# Patient Record
Sex: Male | Born: 1935 | Race: White | Hispanic: No | Smoking: Never smoker
Health system: Southern US, Community
[De-identification: ages and names within clinical notes are randomized; demographics above are authoritative.]

## PROBLEM LIST (undated history)

## (undated) DIAGNOSIS — M1990 Unspecified osteoarthritis, unspecified site: Secondary | ICD-10-CM

## (undated) DIAGNOSIS — R0989 Other specified symptoms and signs involving the circulatory and respiratory systems: Secondary | ICD-10-CM

## (undated) DIAGNOSIS — E785 Hyperlipidemia, unspecified: Secondary | ICD-10-CM

## (undated) DIAGNOSIS — I2510 Atherosclerotic heart disease of native coronary artery without angina pectoris: Secondary | ICD-10-CM

## (undated) DIAGNOSIS — D509 Iron deficiency anemia, unspecified: Secondary | ICD-10-CM

## (undated) DIAGNOSIS — IMO0002 Reserved for concepts with insufficient information to code with codable children: Secondary | ICD-10-CM

## (undated) DIAGNOSIS — G4733 Obstructive sleep apnea (adult) (pediatric): Secondary | ICD-10-CM

## (undated) DIAGNOSIS — M25561 Pain in right knee: Secondary | ICD-10-CM

## (undated) DIAGNOSIS — I4891 Unspecified atrial fibrillation: Secondary | ICD-10-CM

## (undated) DIAGNOSIS — K219 Gastro-esophageal reflux disease without esophagitis: Secondary | ICD-10-CM

## (undated) DIAGNOSIS — C801 Malignant (primary) neoplasm, unspecified: Secondary | ICD-10-CM

## (undated) DIAGNOSIS — I10 Essential (primary) hypertension: Secondary | ICD-10-CM

## (undated) DIAGNOSIS — Z992 Dependence on renal dialysis: Secondary | ICD-10-CM

## (undated) DIAGNOSIS — I739 Peripheral vascular disease, unspecified: Secondary | ICD-10-CM

## (undated) DIAGNOSIS — N186 End stage renal disease: Secondary | ICD-10-CM

## (undated) DIAGNOSIS — I350 Nonrheumatic aortic (valve) stenosis: Secondary | ICD-10-CM

## (undated) HISTORY — DX: Atherosclerotic heart disease of native coronary artery without angina pectoris: I25.10

## (undated) HISTORY — DX: Essential (primary) hypertension: I10

## (undated) HISTORY — PX: SHOULDER SURGERY: SHX246

## (undated) HISTORY — DX: Obstructive sleep apnea (adult) (pediatric): G47.33

## (undated) HISTORY — PX: HERNIA REPAIR: SHX51

## (undated) HISTORY — DX: Pain in right knee: M25.561

## (undated) HISTORY — DX: Unspecified osteoarthritis, unspecified site: M19.90

## (undated) HISTORY — DX: Iron deficiency anemia, unspecified: D50.9

## (undated) HISTORY — PX: BACK SURGERY: SHX140

## (undated) HISTORY — DX: End stage renal disease: Z99.2

## (undated) HISTORY — DX: Hyperlipidemia, unspecified: E78.5

## (undated) HISTORY — PX: KNEE SURGERY: SHX244

## (undated) HISTORY — PX: CHOLECYSTECTOMY: SHX55

## (undated) HISTORY — DX: Unspecified atrial fibrillation: I48.91

## (undated) HISTORY — PX: NECK SURGERY: SHX720

## (undated) HISTORY — DX: Malignant (primary) neoplasm, unspecified: C80.1

## (undated) HISTORY — PX: AV FISTULA PLACEMENT: SHX1204

## (undated) HISTORY — DX: Reserved for concepts with insufficient information to code with codable children: IMO0002

## (undated) HISTORY — DX: Other specified symptoms and signs involving the circulatory and respiratory systems: R09.89

## (undated) HISTORY — PX: LEG SURGERY: SHX1003

## (undated) HISTORY — DX: Peripheral vascular disease, unspecified: I73.9

## (undated) HISTORY — DX: Gastro-esophageal reflux disease without esophagitis: K21.9

## (undated) HISTORY — DX: End stage renal disease: N18.6

## (undated) HISTORY — DX: Nonrheumatic aortic (valve) stenosis: I35.0

## (undated) HISTORY — PX: CATARACT EXTRACTION, BILATERAL: SHX1313

---

## 1998-12-04 ENCOUNTER — Ambulatory Visit: Admission: RE | Admit: 1998-12-04 | Discharge: 1998-12-04 | Payer: Self-pay | Admitting: Internal Medicine

## 1999-12-21 ENCOUNTER — Emergency Department (HOSPITAL_COMMUNITY): Admission: EM | Admit: 1999-12-21 | Discharge: 1999-12-21 | Payer: Self-pay | Admitting: Emergency Medicine

## 2000-02-21 ENCOUNTER — Ambulatory Visit (HOSPITAL_COMMUNITY): Admission: RE | Admit: 2000-02-21 | Discharge: 2000-02-21 | Payer: Self-pay | Admitting: Neurological Surgery

## 2000-02-21 ENCOUNTER — Encounter: Payer: Self-pay | Admitting: Neurological Surgery

## 2000-03-09 ENCOUNTER — Encounter: Admission: RE | Admit: 2000-03-09 | Discharge: 2000-04-15 | Payer: Self-pay | Admitting: Neurological Surgery

## 2000-05-23 ENCOUNTER — Encounter: Payer: Self-pay | Admitting: Emergency Medicine

## 2000-05-23 ENCOUNTER — Emergency Department (HOSPITAL_COMMUNITY): Admission: EM | Admit: 2000-05-23 | Discharge: 2000-05-23 | Payer: Self-pay | Admitting: Emergency Medicine

## 2000-11-29 ENCOUNTER — Encounter: Payer: Self-pay | Admitting: Neurological Surgery

## 2000-11-29 ENCOUNTER — Encounter: Admission: RE | Admit: 2000-11-29 | Discharge: 2000-11-29 | Payer: Self-pay | Admitting: Neurological Surgery

## 2001-07-12 ENCOUNTER — Encounter: Payer: Self-pay | Admitting: Internal Medicine

## 2001-07-12 ENCOUNTER — Encounter: Admission: RE | Admit: 2001-07-12 | Discharge: 2001-07-12 | Payer: Self-pay | Admitting: Internal Medicine

## 2002-01-07 ENCOUNTER — Emergency Department (HOSPITAL_COMMUNITY): Admission: EM | Admit: 2002-01-07 | Discharge: 2002-01-07 | Payer: Self-pay | Admitting: *Deleted

## 2002-06-26 ENCOUNTER — Encounter: Admission: RE | Admit: 2002-06-26 | Discharge: 2002-06-26 | Payer: Self-pay | Admitting: Endocrinology

## 2002-06-26 ENCOUNTER — Encounter: Payer: Self-pay | Admitting: Endocrinology

## 2002-07-28 ENCOUNTER — Encounter: Payer: Self-pay | Admitting: Orthopedic Surgery

## 2002-07-28 ENCOUNTER — Encounter: Admission: RE | Admit: 2002-07-28 | Discharge: 2002-07-28 | Payer: Self-pay | Admitting: Orthopedic Surgery

## 2002-10-03 ENCOUNTER — Encounter: Payer: Self-pay | Admitting: Orthopedic Surgery

## 2002-10-10 ENCOUNTER — Encounter: Payer: Self-pay | Admitting: Orthopedic Surgery

## 2002-10-10 ENCOUNTER — Inpatient Hospital Stay (HOSPITAL_COMMUNITY): Admission: RE | Admit: 2002-10-10 | Discharge: 2002-10-13 | Payer: Self-pay | Admitting: Orthopedic Surgery

## 2003-04-11 ENCOUNTER — Encounter: Admission: RE | Admit: 2003-04-11 | Discharge: 2003-04-11 | Payer: Self-pay | Admitting: Internal Medicine

## 2003-06-07 ENCOUNTER — Ambulatory Visit (HOSPITAL_COMMUNITY): Admission: RE | Admit: 2003-06-07 | Discharge: 2003-06-07 | Payer: Self-pay | Admitting: Neurological Surgery

## 2003-08-01 ENCOUNTER — Encounter (HOSPITAL_COMMUNITY): Admission: RE | Admit: 2003-08-01 | Discharge: 2003-10-30 | Payer: Self-pay | Admitting: Internal Medicine

## 2003-11-07 ENCOUNTER — Encounter (HOSPITAL_COMMUNITY): Admission: RE | Admit: 2003-11-07 | Discharge: 2003-12-21 | Payer: Self-pay | Admitting: Internal Medicine

## 2003-12-22 ENCOUNTER — Encounter: Admission: RE | Admit: 2003-12-22 | Discharge: 2004-03-21 | Payer: Self-pay | Admitting: Internal Medicine

## 2004-03-25 ENCOUNTER — Encounter: Admission: RE | Admit: 2004-03-25 | Discharge: 2004-06-23 | Payer: Self-pay | Admitting: Internal Medicine

## 2004-04-09 ENCOUNTER — Encounter: Admission: RE | Admit: 2004-04-09 | Discharge: 2004-04-09 | Payer: Self-pay | Admitting: Orthopedic Surgery

## 2004-07-02 ENCOUNTER — Encounter (HOSPITAL_COMMUNITY): Admission: RE | Admit: 2004-07-02 | Discharge: 2004-09-30 | Payer: Self-pay | Admitting: Internal Medicine

## 2004-10-22 ENCOUNTER — Encounter (HOSPITAL_COMMUNITY): Admission: RE | Admit: 2004-10-22 | Discharge: 2004-12-17 | Payer: Self-pay | Admitting: Internal Medicine

## 2004-11-28 ENCOUNTER — Encounter: Admission: RE | Admit: 2004-11-28 | Discharge: 2004-11-28 | Payer: Self-pay | Admitting: Internal Medicine

## 2004-12-24 ENCOUNTER — Encounter (HOSPITAL_COMMUNITY): Admission: RE | Admit: 2004-12-24 | Discharge: 2005-03-20 | Payer: Self-pay | Admitting: Internal Medicine

## 2005-02-05 ENCOUNTER — Inpatient Hospital Stay (HOSPITAL_COMMUNITY): Admission: RE | Admit: 2005-02-05 | Discharge: 2005-02-07 | Payer: Self-pay | Admitting: Cardiology

## 2005-02-06 HISTORY — PX: CARDIAC CATHETERIZATION: SHX172

## 2005-04-17 ENCOUNTER — Encounter (HOSPITAL_COMMUNITY): Admission: RE | Admit: 2005-04-17 | Discharge: 2005-07-16 | Payer: Self-pay | Admitting: Internal Medicine

## 2005-07-14 ENCOUNTER — Encounter: Admission: RE | Admit: 2005-07-14 | Discharge: 2005-07-14 | Payer: Self-pay | Admitting: Internal Medicine

## 2006-05-13 ENCOUNTER — Ambulatory Visit (HOSPITAL_COMMUNITY): Admission: RE | Admit: 2006-05-13 | Discharge: 2006-05-13 | Payer: Self-pay | Admitting: Neurological Surgery

## 2006-06-01 HISTORY — PX: CARDIOVASCULAR STRESS TEST: SHX262

## 2006-07-26 ENCOUNTER — Ambulatory Visit: Payer: Self-pay | Admitting: Internal Medicine

## 2006-07-26 ENCOUNTER — Inpatient Hospital Stay (HOSPITAL_COMMUNITY): Admission: RE | Admit: 2006-07-26 | Discharge: 2006-08-05 | Payer: Self-pay | Admitting: Neurological Surgery

## 2006-08-05 ENCOUNTER — Inpatient Hospital Stay (HOSPITAL_COMMUNITY)
Admission: RE | Admit: 2006-08-05 | Discharge: 2006-08-18 | Payer: Self-pay | Admitting: Physical Medicine & Rehabilitation

## 2006-08-05 ENCOUNTER — Ambulatory Visit: Payer: Self-pay | Admitting: Physical Medicine & Rehabilitation

## 2007-10-24 ENCOUNTER — Ambulatory Visit: Payer: Self-pay | Admitting: Surgery

## 2008-02-01 ENCOUNTER — Ambulatory Visit: Payer: Self-pay | Admitting: Surgery

## 2008-02-01 ENCOUNTER — Ambulatory Visit (HOSPITAL_COMMUNITY): Admission: RE | Admit: 2008-02-01 | Discharge: 2008-02-01 | Payer: Self-pay | Admitting: Surgery

## 2008-02-06 ENCOUNTER — Inpatient Hospital Stay (HOSPITAL_COMMUNITY): Admission: AD | Admit: 2008-02-06 | Discharge: 2008-02-09 | Payer: Self-pay | Admitting: Cardiology

## 2008-02-07 ENCOUNTER — Encounter: Payer: Self-pay | Admitting: Cardiology

## 2008-02-07 HISTORY — PX: TRANSTHORACIC ECHOCARDIOGRAM: SHX275

## 2008-02-13 ENCOUNTER — Ambulatory Visit: Payer: Self-pay | Admitting: Surgery

## 2008-02-14 ENCOUNTER — Ambulatory Visit (HOSPITAL_COMMUNITY): Admission: RE | Admit: 2008-02-14 | Discharge: 2008-02-14 | Payer: Self-pay | Admitting: Vascular Surgery

## 2008-03-09 ENCOUNTER — Inpatient Hospital Stay (HOSPITAL_COMMUNITY): Admission: EM | Admit: 2008-03-09 | Discharge: 2008-03-13 | Payer: Self-pay | Admitting: Emergency Medicine

## 2008-05-31 ENCOUNTER — Ambulatory Visit: Payer: Self-pay | Admitting: Vascular Surgery

## 2008-05-31 ENCOUNTER — Ambulatory Visit (HOSPITAL_COMMUNITY): Admission: RE | Admit: 2008-05-31 | Discharge: 2008-05-31 | Payer: Self-pay | Admitting: Vascular Surgery

## 2008-11-27 ENCOUNTER — Encounter: Admission: RE | Admit: 2008-11-27 | Discharge: 2008-11-27 | Payer: Self-pay | Admitting: Nephrology

## 2008-12-04 ENCOUNTER — Encounter: Admission: RE | Admit: 2008-12-04 | Discharge: 2008-12-04 | Payer: Self-pay | Admitting: Nephrology

## 2008-12-04 ENCOUNTER — Encounter: Admission: RE | Admit: 2008-12-04 | Discharge: 2008-12-18 | Payer: Self-pay | Admitting: Internal Medicine

## 2009-02-19 ENCOUNTER — Ambulatory Visit: Payer: Self-pay | Admitting: Vascular Surgery

## 2009-02-25 ENCOUNTER — Ambulatory Visit: Payer: Self-pay | Admitting: Surgery

## 2009-03-14 ENCOUNTER — Ambulatory Visit (HOSPITAL_COMMUNITY): Admission: RE | Admit: 2009-03-14 | Discharge: 2009-03-14 | Payer: Self-pay | Admitting: Surgery

## 2009-03-14 ENCOUNTER — Ambulatory Visit: Payer: Self-pay | Admitting: Surgery

## 2009-04-01 ENCOUNTER — Inpatient Hospital Stay (HOSPITAL_COMMUNITY): Admission: EM | Admit: 2009-04-01 | Discharge: 2009-04-07 | Payer: Self-pay | Admitting: Emergency Medicine

## 2009-04-04 ENCOUNTER — Encounter (INDEPENDENT_AMBULATORY_CARE_PROVIDER_SITE_OTHER): Payer: Self-pay | Admitting: Internal Medicine

## 2009-04-04 HISTORY — PX: TRANSTHORACIC ECHOCARDIOGRAM: SHX275

## 2009-04-16 ENCOUNTER — Ambulatory Visit: Payer: Self-pay | Admitting: Vascular Surgery

## 2009-10-03 ENCOUNTER — Emergency Department (HOSPITAL_COMMUNITY): Admission: EM | Admit: 2009-10-03 | Discharge: 2009-10-04 | Payer: Self-pay | Admitting: Emergency Medicine

## 2009-11-11 ENCOUNTER — Emergency Department (HOSPITAL_COMMUNITY): Admission: EM | Admit: 2009-11-11 | Discharge: 2009-11-11 | Payer: Self-pay | Admitting: Emergency Medicine

## 2009-11-28 ENCOUNTER — Ambulatory Visit: Payer: Self-pay | Admitting: Cardiology

## 2010-01-21 ENCOUNTER — Emergency Department (HOSPITAL_COMMUNITY)
Admission: EM | Admit: 2010-01-21 | Discharge: 2010-01-21 | Payer: Self-pay | Source: Home / Self Care | Admitting: Emergency Medicine

## 2010-04-12 ENCOUNTER — Emergency Department (HOSPITAL_COMMUNITY)
Admission: EM | Admit: 2010-04-12 | Discharge: 2010-04-12 | Payer: Self-pay | Source: Home / Self Care | Admitting: Family Medicine

## 2010-05-23 ENCOUNTER — Ambulatory Visit: Payer: Self-pay | Admitting: Cardiology

## 2010-05-29 NOTE — Letter (Signed)
Summary: [facility] - Office Visit  [facility] - Office Visit   Imported By: Marylou Mccoy 05/19/2010 11:48:40  _____________________________________________________________________  External Attachment:    Type:   Image     Comment:   External Document

## 2010-05-29 NOTE — Op Note (Signed)
Summary: [facility]  [facility]   Imported By: Marylou Mccoy 05/19/2010 11:43:53  _____________________________________________________________________  External Attachment:    Type:   Image     Comment:   External Document

## 2010-06-03 LAB — CBC
HCT: 34.6 % — ABNORMAL LOW (ref 39.0–52.0)
Hemoglobin: 11.4 g/dL — ABNORMAL LOW (ref 13.0–17.0)
MCV: 95.1 fL (ref 78.0–100.0)
Platelets: 222 10*3/uL (ref 150–400)
RBC: 3.64 MIL/uL — ABNORMAL LOW (ref 4.22–5.81)
WBC: 11.2 10*3/uL — ABNORMAL HIGH (ref 4.0–10.5)

## 2010-06-03 LAB — POCT I-STAT, CHEM 8
BUN: 46 mg/dL — ABNORMAL HIGH (ref 6–23)
Calcium, Ion: 1.06 mmol/L — ABNORMAL LOW (ref 1.12–1.32)
Chloride: 101 meq/L (ref 96–112)
Creatinine, Ser: 4.6 mg/dL — ABNORMAL HIGH (ref 0.4–1.5)
Glucose, Bld: 116 mg/dL — ABNORMAL HIGH (ref 70–99)
HCT: 39 % (ref 39.0–52.0)
Hemoglobin: 13.3 g/dL (ref 13.0–17.0)
Potassium: 4.3 meq/L (ref 3.5–5.1)
Sodium: 137 mEq/L (ref 135–145)
TCO2: 30 mmol/L (ref 0–100)

## 2010-06-03 LAB — DIFFERENTIAL
Eosinophils Relative: 0 % (ref 0–5)
Lymphocytes Relative: 7 % — ABNORMAL LOW (ref 12–46)
Lymphs Abs: 0.8 10*3/uL (ref 0.7–4.0)
Monocytes Relative: 7 % (ref 3–12)

## 2010-06-05 ENCOUNTER — Ambulatory Visit (INDEPENDENT_AMBULATORY_CARE_PROVIDER_SITE_OTHER): Payer: Medicare Other | Admitting: Cardiology

## 2010-06-05 DIAGNOSIS — I2510 Atherosclerotic heart disease of native coronary artery without angina pectoris: Secondary | ICD-10-CM

## 2010-06-05 DIAGNOSIS — N189 Chronic kidney disease, unspecified: Secondary | ICD-10-CM

## 2010-06-05 DIAGNOSIS — I739 Peripheral vascular disease, unspecified: Secondary | ICD-10-CM

## 2010-06-05 DIAGNOSIS — I10 Essential (primary) hypertension: Secondary | ICD-10-CM

## 2010-06-05 LAB — COMPREHENSIVE METABOLIC PANEL
BUN: 33 mg/dL — ABNORMAL HIGH (ref 6–23)
CO2: 31 mEq/L (ref 19–32)
Chloride: 98 mEq/L (ref 96–112)
Creatinine, Ser: 5.65 mg/dL — ABNORMAL HIGH (ref 0.4–1.5)
GFR calc non Af Amer: 10 mL/min — ABNORMAL LOW (ref >60)
Total Bilirubin: 1.2 mg/dL (ref 0.3–1.2)

## 2010-06-05 LAB — CBC
Hemoglobin: 10.8 g/dL — ABNORMAL LOW (ref 13.0–17.0)
MCH: 31.7 pg (ref 26.0–34.0)
MCV: 98.5 fL (ref 78.0–100.0)
RBC: 3.41 MIL/uL — ABNORMAL LOW (ref 4.22–5.81)

## 2010-06-05 LAB — PROTIME-INR: Prothrombin Time: 15.2 seconds (ref 11.6–15.2)

## 2010-06-05 LAB — APTT: aPTT: 33 seconds (ref 24–37)

## 2010-06-05 LAB — DIFFERENTIAL
Basophils Absolute: 0 10*3/uL (ref 0.0–0.1)
Lymphocytes Relative: 13 % (ref 12–46)
Neutro Abs: 6.5 10*3/uL (ref 1.7–7.7)

## 2010-06-05 LAB — POCT CARDIAC MARKERS: Troponin i, poc: <0.05 ng/mL (ref 0.00–0.09)

## 2010-06-07 LAB — CBC
HCT: 33.3 % — ABNORMAL LOW (ref 39.0–52.0)
HCT: 34.2 % — ABNORMAL LOW (ref 39.0–52.0)
HCT: 34.3 % — ABNORMAL LOW (ref 39.0–52.0)
HCT: 35 % — ABNORMAL LOW (ref 39.0–52.0)
HCT: 36 % — ABNORMAL LOW (ref 39.0–52.0)
Hemoglobin: 11.3 g/dL — ABNORMAL LOW (ref 13.0–17.0)
Hemoglobin: 11.5 g/dL — ABNORMAL LOW (ref 13.0–17.0)
Hemoglobin: 11.6 g/dL — ABNORMAL LOW (ref 13.0–17.0)
Hemoglobin: 11.9 g/dL — ABNORMAL LOW (ref 13.0–17.0)
MCHC: 32.8 g/dL (ref 30.0–36.0)
MCHC: 33.1 g/dL (ref 30.0–36.0)
MCHC: 33.6 g/dL (ref 30.0–36.0)
MCV: 89.8 fL (ref 78.0–100.0)
MCV: 90.2 fL (ref 78.0–100.0)
Platelets: 135 10*3/uL — ABNORMAL LOW (ref 150–400)
Platelets: 178 10*3/uL (ref 150–400)
Platelets: 190 10*3/uL (ref 150–400)
Platelets: 192 10*3/uL (ref 150–400)
Platelets: 200 10*3/uL (ref 150–400)
Platelets: 230 10*3/uL (ref 150–400)
RBC: 3.79 MIL/uL — ABNORMAL LOW (ref 4.22–5.81)
RBC: 3.82 MIL/uL — ABNORMAL LOW (ref 4.22–5.81)
RDW: 17.5 % — ABNORMAL HIGH (ref 11.5–15.5)
RDW: 17.5 % — ABNORMAL HIGH (ref 11.5–15.5)
RDW: 17.5 % — ABNORMAL HIGH (ref 11.5–15.5)
RDW: 17.6 % — ABNORMAL HIGH (ref 11.5–15.5)
RDW: 17.9 % — ABNORMAL HIGH (ref 11.5–15.5)
WBC: 13.1 10*3/uL — ABNORMAL HIGH (ref 4.0–10.5)
WBC: 14.5 10*3/uL — ABNORMAL HIGH (ref 4.0–10.5)
WBC: 15 10*3/uL — ABNORMAL HIGH (ref 4.0–10.5)
WBC: 15.5 10*3/uL — ABNORMAL HIGH (ref 4.0–10.5)

## 2010-06-07 LAB — POCT CARDIAC MARKERS
CKMB, poc: 8.5 ng/mL (ref 1.0–8.0)
Troponin i, poc: <0.05 ng/mL (ref 0.00–0.09)

## 2010-06-07 LAB — RENAL FUNCTION PANEL
Albumin: 3.3 g/dL — ABNORMAL LOW (ref 3.5–5.2)
Albumin: 3.8 g/dL (ref 3.5–5.2)
BUN: 13 mg/dL (ref 6–23)
BUN: 20 mg/dL (ref 6–23)
CO2: 29 mEq/L (ref 19–32)
CO2: 31 mEq/L (ref 19–32)
Calcium: 8.8 mg/dL (ref 8.4–10.5)
Calcium: 9.1 mg/dL (ref 8.4–10.5)
Chloride: 96 mEq/L (ref 96–112)
Chloride: 98 mEq/L (ref 96–112)
Creatinine, Ser: 2.94 mg/dL — ABNORMAL HIGH (ref 0.4–1.5)
Creatinine, Ser: 3.72 mg/dL — ABNORMAL HIGH (ref 0.4–1.5)
GFR calc Af Amer: 19 mL/min — ABNORMAL LOW (ref >60)
GFR calc Af Amer: 25 mL/min — ABNORMAL LOW (ref >60)
GFR calc non Af Amer: 15 mL/min — ABNORMAL LOW (ref >60)
GFR calc non Af Amer: 20 mL/min — ABNORMAL LOW (ref >60)
Glucose, Bld: 80 mg/dL (ref 70–99)
Glucose, Bld: 92 mg/dL (ref 70–99)
Phosphorus: 3.5 mg/dL (ref 2.3–4.6)
Phosphorus: 4.5 mg/dL (ref 2.3–4.6)
Potassium: 4.3 mEq/L (ref 3.5–5.1)
Potassium: 4.5 mEq/L (ref 3.5–5.1)
Sodium: 137 mEq/L (ref 135–145)
Sodium: 138 mEq/L (ref 135–145)

## 2010-06-07 LAB — BLOOD GAS, ARTERIAL
Acid-Base Excess: 3.3 mmol/L — ABNORMAL HIGH (ref 0.0–2.0)
Drawn by: 249101
O2 Content: 2 L/min
pCO2 arterial: 39 mmHg (ref 35.0–45.0)
pH, Arterial: 7.455 — ABNORMAL HIGH (ref 7.350–7.450)
pO2, Arterial: 76 mmHg — ABNORMAL LOW (ref 80.0–100.0)

## 2010-06-07 LAB — HEPATITIS B SURFACE ANTIGEN
Hepatitis B Surface Ag: NEGATIVE
Hepatitis B Surface Ag: NEGATIVE

## 2010-06-07 LAB — DIFFERENTIAL
Basophils Absolute: 0 10*3/uL (ref 0.0–0.1)
Basophils Relative: 0 % (ref 0–1)
Eosinophils Absolute: 0 10*3/uL (ref 0.0–0.7)
Eosinophils Relative: 0 % (ref 0–5)
Monocytes Absolute: 0.7 10*3/uL (ref 0.1–1.0)
Neutro Abs: 13.8 10*3/uL — ABNORMAL HIGH (ref 1.7–7.7)

## 2010-06-07 LAB — PHOSPHORUS
Phosphorus: 5.3 mg/dL — ABNORMAL HIGH (ref 2.3–4.6)
Phosphorus: 6.5 mg/dL — ABNORMAL HIGH (ref 2.3–4.6)

## 2010-06-07 LAB — GLUCOSE, CAPILLARY
Glucose-Capillary: 108 mg/dL — ABNORMAL HIGH (ref 70–99)
Glucose-Capillary: 121 mg/dL — ABNORMAL HIGH (ref 70–99)

## 2010-06-07 LAB — BASIC METABOLIC PANEL
BUN: 30 mg/dL — ABNORMAL HIGH (ref 6–23)
BUN: 35 mg/dL — ABNORMAL HIGH (ref 6–23)
BUN: 63 mg/dL — ABNORMAL HIGH (ref 6–23)
CO2: 27 mEq/L (ref 19–32)
CO2: 31 mEq/L (ref 19–32)
Calcium: 8.5 mg/dL (ref 8.4–10.5)
Calcium: 8.6 mg/dL (ref 8.4–10.5)
Calcium: 8.7 mg/dL (ref 8.4–10.5)
Chloride: 94 mEq/L — ABNORMAL LOW (ref 96–112)
Creatinine, Ser: 4.2 mg/dL — ABNORMAL HIGH (ref 0.4–1.5)
Creatinine, Ser: 5.74 mg/dL — ABNORMAL HIGH (ref 0.4–1.5)
Creatinine, Ser: 6.43 mg/dL — ABNORMAL HIGH (ref 0.4–1.5)
GFR calc Af Amer: 11 mL/min — ABNORMAL LOW (ref >60)
GFR calc non Af Amer: 8 mL/min — ABNORMAL LOW (ref >60)
GFR calc non Af Amer: 9 mL/min — ABNORMAL LOW (ref >60)
Glucose, Bld: 102 mg/dL — ABNORMAL HIGH (ref 70–99)
Glucose, Bld: 111 mg/dL — ABNORMAL HIGH (ref 70–99)
Potassium: 4 mEq/L (ref 3.5–5.1)
Sodium: 136 mEq/L (ref 135–145)
Sodium: 138 mEq/L (ref 135–145)

## 2010-06-07 LAB — CARDIAC PANEL(CRET KIN+CKTOT+MB+TROPI)
CK, MB: 15.3 ng/mL (ref 0.3–4.0)
CK, MB: 9.3 ng/mL (ref 0.3–4.0)
Relative Index: 1.6 (ref 0.0–2.5)
Relative Index: 2.1 (ref 0.0–2.5)
Total CK: 564 U/L — ABNORMAL HIGH (ref 7–232)
Total CK: 723 U/L — ABNORMAL HIGH (ref 7–232)
Troponin I: 0.06 ng/mL (ref 0.00–0.06)
Troponin I: 0.08 ng/mL — ABNORMAL HIGH (ref 0.00–0.06)

## 2010-06-07 LAB — COMPREHENSIVE METABOLIC PANEL
ALT: 24 U/L (ref 0–53)
Albumin: 3.6 g/dL (ref 3.5–5.2)
Alkaline Phosphatase: 91 U/L (ref 39–117)
BUN: 43 mg/dL — ABNORMAL HIGH (ref 6–23)
Chloride: 97 mEq/L (ref 96–112)
Potassium: 6 mEq/L — ABNORMAL HIGH (ref 3.5–5.1)
Total Bilirubin: 0.8 mg/dL (ref 0.3–1.2)

## 2010-06-07 LAB — CK TOTAL AND CKMB (NOT AT ARMC)
CK, MB: 18.6 ng/mL (ref 0.3–4.0)
Relative Index: 3.4 — ABNORMAL HIGH (ref 0.0–2.5)
Relative Index: 3.4 — ABNORMAL HIGH (ref 0.0–2.5)
Total CK: 551 U/L — ABNORMAL HIGH (ref 7–232)

## 2010-06-07 LAB — IRON AND TIBC
Iron: 198 ug/dL — ABNORMAL HIGH (ref 42–135)
Saturation Ratios: 61 % — ABNORMAL HIGH (ref 20–55)
UIBC: 124 ug/dL

## 2010-06-07 LAB — URINE CULTURE
Colony Count: NO GROWTH
Special Requests: NEGATIVE

## 2010-06-07 LAB — CULTURE, BLOOD (ROUTINE X 2)

## 2010-06-07 LAB — ALBUMIN
Albumin: 2.8 g/dL — ABNORMAL LOW (ref 3.5–5.2)
Albumin: 2.9 g/dL — ABNORMAL LOW (ref 3.5–5.2)

## 2010-06-07 LAB — ALT: ALT: 27 U/L (ref 0–53)

## 2010-06-07 LAB — PTH, INTACT AND CALCIUM
Calcium, Total (PTH): 9.1 mg/dL (ref 8.4–10.5)
PTH: 148 pg/mL — ABNORMAL HIGH (ref 14.0–72.0)

## 2010-06-07 LAB — HEPATITIS B CORE ANTIBODY, TOTAL: Hep B Core Total Ab: NEGATIVE

## 2010-06-08 LAB — CBC
HCT: 35.3 % — ABNORMAL LOW (ref 39.0–52.0)
Hemoglobin: 11.1 g/dL — ABNORMAL LOW (ref 13.0–17.0)
MCHC: 33.3 g/dL (ref 30.0–36.0)
MCHC: 33.8 g/dL (ref 30.0–36.0)
MCV: 89 fL (ref 78.0–100.0)
RBC: 3.68 MIL/uL — ABNORMAL LOW (ref 4.22–5.81)
RDW: 16.8 % — ABNORMAL HIGH (ref 11.5–15.5)

## 2010-06-08 LAB — BASIC METABOLIC PANEL
CO2: 28 mEq/L (ref 19–32)
Chloride: 93 mEq/L — ABNORMAL LOW (ref 96–112)
GFR calc Af Amer: 10 mL/min — ABNORMAL LOW (ref >60)
Potassium: 4 mEq/L (ref 3.5–5.1)
Sodium: 131 mEq/L — ABNORMAL LOW (ref 135–145)

## 2010-06-08 LAB — DIFFERENTIAL
Basophils Absolute: 0 10*3/uL (ref 0.0–0.1)
Basophils Relative: 0 % (ref 0–1)
Eosinophils Relative: 2 % (ref 0–5)
Monocytes Absolute: 0.7 10*3/uL (ref 0.1–1.0)
Neutro Abs: 8.2 10*3/uL — ABNORMAL HIGH (ref 1.7–7.7)

## 2010-06-08 LAB — POCT I-STAT, CHEM 8
Calcium, Ion: 1.12 mmol/L (ref 1.12–1.32)
Chloride: 99 mEq/L (ref 96–112)
HCT: 37 % — ABNORMAL LOW (ref 39.0–52.0)
TCO2: 32 mmol/L (ref 0–100)

## 2010-06-08 LAB — POCT CARDIAC MARKERS

## 2010-06-23 LAB — POCT I-STAT, CHEM 8
Creatinine, Ser: 3.2 mg/dL — ABNORMAL HIGH (ref 0.4–1.5)
Glucose, Bld: 112 mg/dL — ABNORMAL HIGH (ref 70–99)
Hemoglobin: 16.3 g/dL (ref 13.0–17.0)
TCO2: 33 mmol/L (ref 0–100)

## 2010-08-05 NOTE — Procedures (Signed)
CEPHALIC VEIN MAPPING   INDICATION:  Preop exam for AV fistula placement.   HISTORY:  End-stage renal disease.   EXAM:  The right cephalic vein was not evaluated.   The left cephalic vein is compressible.   Diameter measurements range from 0.2 to 0.59 cm.   See attached worksheet for all measurements.   IMPRESSION:  Patent left cephalic vein which is of acceptable diameter  for use as a dialysis access site.   ___________________________________________  V. Charlena Cross, MD   CH/MEDQ  D:  10/24/2007  T:  10/25/2007  Job:  [id number]

## 2010-08-05 NOTE — Assessment & Plan Note (Signed)
OFFICE VISIT   KENSINGTON, DUERST  DOB:  November 20, 1918                                       04/16/2009  QQVZD#:[id number]   An angiography was performed by Dr. Myra Gianotti on December 23 for bilateral  leg pain.  He was found to have severe nonreconstructable bilateral  tibial disease.  I have reviewed those angiograms today and concur with  that.  He apparently had a fall at home a few weeks ago, possibly  related to taking too much amitriptyline.  He was admitted to the  hospital for 6 days, discharged on January 16 with altered mental status  and pneumonia and sepsis syndrome.  He has developed some pain, erythema  and tenderness in the right first and second toe where he apparently cut  himself between the 2 toes at the time of the fall.  He is currently on  Cipro at home and has been seen by Dr.  Wynelle Cleveland who referred him to [place]  initially.   PHYSICAL EXAMINATION:  Today blood pressure 125/50, heart rate 78,  respirations 14, temperature 98.  He is an elderly male who is alert and  oriented in no apparent distress.  HEENT:  Exam is unremarkable.  Chest:  Clear to auscultation.  Lower extremity exam reveals 3+ femoral  and 3+  popliteal pulses bilaterally with no distal pulses.  The right first and  second toe were erythematous, swollen and tender with no purulent  drainage.  The left foot is free of any active ulceration.  There is a  dry eschar on the lateral aspect of the ankle.   I discussed this with the patient and his son and the fact that he is  not a candidate for revascularization.  Will await for Dr.  Wynelle Cleveland to  see the patient in the next 2 days and decide whether to refer him to  Dr. Aldean Baker,  orthopedic surgeon or whether Dr. Wynelle Cleveland will  manage this further.  He will return to see [place] on a p.r.n. basis.     Quita Skye Hart Rochester, M.D.  Electronically Signed   JDL/MEDQ  D:  04/16/2009  T:  04/17/2009  Job:  [id number]
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
## 2010-08-05 NOTE — Discharge Summary (Signed)
NAME:  Gerald Tucker, Gerald Tucker                 ACCOUNT NO.:  [id number]   MEDICAL RECORD NO.:  [id number]          PATIENT TYPE:  IPS   LOCATION:  4010                         FACILITY:  [facility]   PHYSICIAN:  Ranelle Oyster, M.D.DATE OF BIRTH:  June 10, 1918   DATE OF ADMISSION:  08/05/2006  DATE OF DISCHARGE:  08/18/2006                               DISCHARGE SUMMARY   DISCHARGE DIAGNOSES:  1. Lumbar stenosis with radiculopathy, status post decompression of      lumbar L2-S1, L4-5, with PEEK spacer of lumbar L3-4 and 4-5,      completed Jul 26, 2006.  2. Pain control.  3. Subcutaneous Lovenox for deep vein thrombosis prophylaxis.  4. Postoperative anemia.  5. Postoperative delirium, resolved.  6. Hypertension.  7. Chronic renal insufficiency.  8. Obstructive sleep apnea.   This is an 77 year old male, with history of lumbar laminectomy 10 years  ago, who was admitted on Jul 26, 2006 with progressive low back pain.  X-  rays and myelogram showed spondylolisthesis with radiculopathy, lumbar  L2-3, 3-4, and 4-5.  He underwent decompression with a PEEK spacer of  lumbar L3 and 4-5 on Jul 26, 2006 per Dr. Danielle Dess.  Follow-up critical  care medicine for creatinine 2.8-3.81, with findings of chronic renal  insufficiency and monitored.  Anemia 8.2, transfused 2 units of packed  red blood cells.  Neurology follow-up, Dr. Sharene Skeans, for delirium;  improved with decrease in narcotics.  Subcutaneous Lovenox added for  deep vein thrombosis prophylaxis.  Advised lumbar corset when out of  bed.  A chest x-ray on May 10 did show some bibasilar atelectasis;  placed on empiric Ceftin May 13 times seven days.   PAST MEDICAL HISTORY:  See Discharge Diagnoses.  No alcohol or  tobacco.   ALLERGIES:  PENICILLIN AND MORPHINE.   SOCIAL HISTORY:  Lives alone.  He is a Visual merchandiser.  One-level home with a  ramp.  He plans hired assistance as needed.  His local family works.   MEDICATIONS PRIOR TO ADMISSION:  1. Neurontin  300 mg 2 tablets daily.  2. Norvasc 10 mg 1/2 tablet daily.  3. Imdur 60 mg daily.  4. Metoprolol 50 mg twice daily.  5. Lasix 40 mg in the morning and 1/2 tablet in the afternoon.  6. Prilosec 20 mg twice daily.  7. Folic acid daily.  8. Aspirin 81 mg daily.   REHABILITATION HOSPITAL COURSE:  The patient was admitted to Inpatient  Rehab Services, with therapies initiated on a three-hour-daily basis,  consisting of physical therapy, occupational therapy, and rehabilitation  nursing.  The following issues were addressed during the patient's  rehabilitation stay:  Pertaining to Mr. Segundo lumbar decompression for  stenosis with radiculopathy, Jul 26, 2006, surgical site healing nicely.  No signs of infection.  He was wearing a lumbar corset when out of bed.  He would follow up with Neurosurgery, Dr. Danielle Dess.  Functionally he was  supervision for bed mobility, ambulating with a rolling walker, minimal  assist to navigate stairs, supervision upper body dressing, minimal  assist lower body with back precautions.  Pain  control:  Ongoing with  the use of oxycodone with good results.  He did have some early hospital  course delirium, felt to be induced by narcotics.  This was monitored.  He remained on subcutaneous Lovenox for deep vein thrombosis prophylaxis  throughout his rehab course.   Postoperative anemia 9.9, hematocrit 29.5.  He had no headache,  dizziness or shortness of breath.  Blood pressures monitored with Lasix,  Imdur, Lopressor, and Norvasc.  He exhibited no signs of fluid overload.  His oxygen saturations remained greater than 90% on room air.  He  completed an empiric course of Ceftin for suspected pneumonia; again he  remained afebrile throughout this time.  Chronic renal insufficiency,  with latest creatinine of 3.25.  He would follow up with his primary MD.  That showed an overall improvement from 3.58.  He remained on his CPAP  for obstructive sleep apnea, which was  again without issue.   LATEST LABS:  Sodium 139, potassium 4.3, BUN 49, creatinine 3.25.  Hemoglobin 99, hematocrit 29.5, platelet 310,000.   DISCHARGE MEDICATIONS:  At time of dictation included:  1. Lasix 40 mg daily.  2. Nu-Iron 150 mg twice daily.  3. Aspirin 81 mg daily.  4. Folic acid 1 mg daily.  5. Imdur 60 mg daily.  6. Lopressor 50 mg twice daily.  7. Potassium chloride 20 mEq daily.  8. Norvasc 15 mg daily.  9. Protonix 40 mg daily.  10.Oxycodone immediate release 5 mg 1 or 2 tablets every 4 hours as      needed for pain, dispense 60 tablets.   DISCHARGE ACTIVITIES:  As tolerated, with lumbar corset when out of bed.   DISCHARGE DIET:  Regular.   SPECIAL INSTRUCTIONS:  No drinking, no driving, no smoking.  Continue  therapies as advised per [employer].   FOLLOWUP:  1. Dr. Danielle Dess, Neurosurgery.  2. Dr. Jacky Kindle, medical management.      Mariam Dollar, P.A.      Ranelle Oyster, M.D.  Electronically Signed    DA/MEDQ  D:  08/17/2006  T:  08/17/2006  Job:  [id number]   cc:   Ranelle Oyster, M.D.  Geoffry Paradise, M.D.  Deanna Artis. Sharene Skeans, M.D.  Stefani Dama, M.D.  Dr. [place]
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
## 2010-08-05 NOTE — H&P (Signed)
NAME:  Gerald Tucker, Gerald Tucker NO.:  [id number]   MEDICAL RECORD NO.:  [id number]          PATIENT TYPE:  INP   LOCATION:  2001                         FACILITY:  [facility]   PHYSICIAN:  Peter M. Swaziland, M.D.  DATE OF BIRTH:  09-24-1918   DATE OF ADMISSION:  02/06/2008  DATE OF DISCHARGE:                              HISTORY & PHYSICAL   HISTORY OF PRESENT ILLNESS:  Mr. Schulenburg is an 77 year old white male with  known history of chronic renal failure, coronary artery disease, and  hypertension, who presented to our office this morning with symptoms of  marked increase in shortness of breath over the past 2-1/2 days.  Symptoms began Friday evening.  The patient thought initially a CPAP was  working well.  His dyspnea has progressively worsened over the weekend  to the point where yesterday he states he was unable to get out of bed.  He had no cough, fever, or chills.  He was seen in our office today was  noted to be markedly dyspneic with any exertion.  His oxygen saturations  were anywhere from 79-84% on room air.  Chest x-ray showed mild  pulmonary edema with Charyl Dancer B lines that was new compared to February 01, 2008.  The patient is admitted at this time for inpatient evaluation  and treatment.   PAST MEDICAL HISTORY:  1. Chronic renal failure.  His last BUN and creatinine on January 16, 2008, was 54 and 4.6 respectively.  2. Status post AV fistula placement in his left arm on February 01, 2008, by Dr. Myra Gianotti.  3. Coronary artery disease.  The patient has severe distal LAD      stenosis that has been treated medically since 2006.  His adenosine      Cardiolite study in May 2008 showed no ischemia and normal left      ventricular function with ejection fraction of 55%.  4. Hypertension.  5. Chronic venous insufficiency.  6. Hypercholesterolemia.  7. GERD.  8. Status post back surgery.  9. Status post cholecystectomy.  10.History of sleep apnea, on CPAP  therapy.  11.Chronic arthritis.   ALLERGIES:  PENICILLIN.   CURRENT MEDICATIONS:  1. Lasix 80 mg b.i.d.  2. Metoprolol 50 mg b.i.d.  3. Folic acid 1 mg per day.  4. Omeprazole 20 mg b.i.d.  5. Aspirin 81 mg per day.  6. Isosorbide mononitrate 60 mg per day.  7. Gabapentin 300 mg b.i.d.  8. Amlodipine 5 mg per day.  9. Simvastatin 20 mg per day.  10.Ranexa 500 mg per day.  11.Meclizine 25 mg b.i.d.  12.Iron supplement 65 mg per day.  13.Vitamin D 1000 units daily.  14.P.r.n. oxycodone.   SOCIAL HISTORY:  The patient is a retired Visual merchandiser.  He is widowed.  He  has 4 children.  He is able to walk with a walker.  He denies smoking or  alcohol use.   FAMILY HISTORY:  Both parents died at age 2.  His father with heart  disease and mother with a  stroke.  One brother died in his 57s with a  stroke and 3 other siblings have passed away of unknown causes.   REVIEW OF SYSTEMS:  He does note increasing dyspnea and orthopnea.  He  has noticed increased edema in his legs.  His appetite has been good.  He does use CPAP at home for history of sleep apnea.  He denies any  change in the urine output or dysuria.  He has had no recent bowel or  bladder complaint.  Denies any history of TIA or stroke.  All other  systems are reviewed and are negative.   PHYSICAL EXAMINATION:  GENERAL:  The patient is an elderly white male  who is moderately dyspneic at rest.  He is pale.  VITAL SIGNS:  Blood pressure is 150/60, pulse is 82 and regular, sats  are 84% on room air, and respiration rate is 24.  HEENT:  Balding.  He is normocephalic and atraumatic.  His pupils were  equal and round with clear sclerae.  Oropharynx is clear.  NECK:  No adenopathy or thyromegaly.  Positive JVD.  There are no  bruits.  LUNGS:  Bibasilar rales with expiratory wheezes.  CARDIAC:  A regular rate and rhythm with a grade 1/6 systolic ejection  murmur at the left sternal border.  There is no S3.  PMI is normal.  ABDOMEN:   Obese, soft, and nontender without hepatosplenomegaly, mass,  or bruits.  EXTREMITIES: He has a recent surgical incision in his left brachial area  placement of AV fistula.  There is a palpable and audible bruit.  This  area is very tender to touch and has extensive bruising and mild  erythema.  His lower extremities reveal 1+ edema.  His spine has  significant limitation of motion.  He is wearing a back brace.  NEUROLOGIC:  Nonfocal.  He is alert and oriented and mood is  appropriate.  SKIN:  Pale and dry.   LABORATORY DATA:  Chest x-ray shows cardiomegaly with mild pulmonary  edema and Kerley B lines, that are new since February 01, 2008.  He does  have chronic elevation of right hemidiaphragm.  His EKG is normal.   IMPRESSION:  1. Acute pulmonary edema due to volume overload probably related to      his end-stage renal disease.  He also has some diastolic      dysfunction.  2. Coronary artery disease with single-vessel distal left anterior      descending coronary artery disease.  3. Status post placement of left arm arteriovenous fistula.  4. Hypertension.  5. Hypercholesterolemia.  6. Gastroesophageal reflux disease.  7. History of arthritis with severe chronic back pain.   PLAN:  The patient will be admitted to telemetry.  We will treat with  oxygen support and IV diuretics, will be placed on fluid and sodium  restriction.  We will obtain admission lab work including chemistries,  CBC, BNP level, coags.  We will obtain nephrology consultation.           ______________________________  Peter M. [place]il[place], M.D.     PMJ/MEDQ  D:  02/06/2008  T:  02/07/2008  Job:  [id number]   cc:   Geoffry Paradise, M.D.  Lemmie Evens, M.D.  Garnetta Buddy, M.D.  Jorge Ny, MD

## 2010-08-05 NOTE — H&P (Signed)
NAME:  Gerald Tucker, GALES NO.:  [id number]   MEDICAL RECORD NO.:  [id number]          PATIENT TYPE:  INP   LOCATION:  6742                         FACILITY:  [facility]   PHYSICIAN:  Gwen Pounds, MD       DATE OF BIRTH:  [birth year]   DATE OF ADMISSION:  03/09/2008  DATE OF DISCHARGE:                              HISTORY & PHYSICAL   PRIMARY CARE Melea Prezioso:  Geoffry Paradise, MD   NEPHROLOGIST:  Garnetta Buddy, MD   CARDIOLOGIST:  Peter M. [place]il[place], MD   CHIEF COMPLAINT:  Myoclonic jerks and confusion and hallucinations.   HISTORY OF PRESENT ILLNESS:  This is an 77 year old male with multiple  medical problems with end-stage renal disease, on hemodialysis via right  chest catheter on Tuesday, Thursday, Saturday scheduled dialysis.  This  was started on February 21, 2008.  At around the same time of his last  hospital stay, the Neurontin was diminished and has recently been  discontinued as well.  About the same time of starting the dialysis, all  of his myoclonic jerking did develop and has been worse over the last 1  week or so.  About 1 month ago prior to the last hospitalization, he  describes while working on the fence, getting jerky, he fell down, and  got up and was okay at that time.  The son reports that the myoclonus  was certainly very minimal but had started before the dialysis did  start.  The last 2 or 3 days, the son reports increasing confusion,  increasing hallucinations.  He has seen dogs, cats, brother's cows, and  ants that are certainly not there.  Family called our office and Dr.  Marland Mcalpine office and the patient was sent to the emergency department  today.  He is describing much substernal chest pain but does not seem to  be effaced by it.  His recent admit for congestive heart failure prior  to the starting of the hemodialysis.  No further congestive heart  failure symptoms. Now, his biggest complaint is severe jaw catch, and I  was called for inpatient  admission.   PAST MEDICAL HISTORY:  1. Chronic kidney disease, end-stage renal disease, on chronic      hemodialysis.  2. Coronary artery disease, medically treated.  3. Nephrosclerosis.  4. Hypertension.  5. Congestive heart failure.  6. Left AV fistula.  7. Dyslipidemia.  8. Venous insufficiency.  9. GERD.  10.Iron-deficiency anemia.  11.Obstructive sleep apnea, on CPAP.  12.Secondary hyperparathyroidism.  13.Cholecystectomy.  14.Low pain with surgery x3.  15.History of cervical neck surgery.  16.Osteoarthritis.  17.Bilateral cataracts.   ALLERGIES:  MORPHINE and PENICILLIN.   MEDICATIONS LIST:  1. Oxygen 2 L nasal cannula.  2. MiraLax as needed.  3. Aspirin 81 daily.  4. Folic acid 1 mg p.o. daily.  5. Omeprazole 20 mg p.o. nightly.  6. Meclizine 25 mg p.o. t.i.d.  7. Isosorbide 60 mg p.o. with lunch.  8. Norvasc 5 mg p.o. with lunch.  9. Metoprolol 50 mg p.o. with lunch and at bedtime.  10.Gabapentin 300  mg p.o. b.i.d. was decreased to 1 daily about a      month ago and discontinued just recently.  11.Fluconazole 100 mg p.o. daily.  12.Ranexa 500 mg with lunch.  13.Vitamin D 1000 units International Units daily.  14.Zocor 20 mg p.o. nightly.   SOCIAL HISTORY:  He lives by himself, has a Engineer, production.  He is  retired farm.  Widow.   FAMILY HISTORY:  Coronary artery disease and stroke.   REVIEW OF SYSTEMS:  Otherwise negative.  Chest pain is mild.  No  shortness of breath, no recent falls.  Myoclonic jerks obviously of  upper extremity and lower extremity and more slightly.  A very raw and  painful groin.  No nausea, no vomiting, no diarrhea.  Minimal amount of  urine output, and hallucinations are not noted.   PHYSICAL EXAMINATION:  VITAL SIGNS:  Temperature 97.3, blood pressure  103-128/43-76, heart rate 65, respiratory rate 18, sating 95% on 2 L.  GENERAL:  Alert and oriented to person, place, time, and year.  NEUROLOGIC:  He is mentally okay at this  current time.  As I was  dictating, his son just came out to me and told me that his dad is  seeing ants on the ceiling and actually actively hallucinating but is  not bothered by any of these hallucinations.  Bilateral upper extremity  and lower extremity myoclonic jerks noted.  PULMONARY:  Clear to auscultation bilaterally.  CARDIAC:  Regular and murmur noted.  ABDOMEN:  Soft.  EXTREMITIES:  No edema.  Right chest has a catheter.  The left arm has  AV fistula with bruit.  He is moving all 4s but with increasing  intention, his jerking gets worse.  He has got a little back scar.  He  has got a little sebaceous cyst on his back.  He has got severe redness  and swelling and yeast rash over the bilateral scrotum.   ANCILLARY DATA:  White count 9000, hemoglobin 11.9, platelet count 203.  Urinalysis, 3-6 white blood cells, he has got many bacteria.  Sodium  136, potassium 4.9, chloride 100, bicarb 28, BUN 25, creatinine 6.56.  Glucose 106.  AST 32.  Cranial CT, no acute disease, positive  atherosclerosis noted, maxillary thickening, volume loss inferior  frontal gyri, temporal lobes bilaterally.   ASSESSMENT:  We will admit this patient with myoclonic jerks,  hallucinations, and confusion.   PLAN:  1. Telemetry because of the chest pain.  We will check CK and      troponin.  I do not believe this could be a major issue.  2. Treat scrotum aggressively and add Diflucan x1.  3. Neurologic evaluation.  Consider ReQuip versus Haldol versus      antiseizure medication.  Cranial CT was negative.  He may need an      MRI, may be difficult with the jerks.  We will wait and see what      Neurology says.  Nothing obvious cause of encephalopathy.  4. Followup labs in the morning.  5. May need antibiotics for potential urinary tract infection.      Cultures have been ordered.  6. Symptoms worsening with decreased Neurontin.  I wonder if there is      some sort of correlation here.  7. PT/OT, case  worker has been ordered.  8. Home medications have been ordered.  9. Call Nephrology for hemodialysis.  10.We will get Nephro and Neurology involved as stated above.  11.Heparin with hemodialysis  for DVT prophylaxis.  12.No obvious overdoses or toxins ingested to cause this obvious      encephalopathy.  13.Hold the meclizine or use it p.r.n. only.      Gwen Pounds, MD  Electronically Signed     JMR/MEDQ  D:  03/09/2008  T:  03/10/2008  Job:  [id number]   cc:   Geoffry Paradise, M.D.  Garnetta Buddy, M.D.  Peter M. [place], M.D.
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
## 2010-08-05 NOTE — Op Note (Signed)
NAME:  Gerald Tucker, Gerald Tucker                 ACCOUNT NO.:  [id number]   MEDICAL RECORD NO.:  [id number]          PATIENT TYPE:  AMB   LOCATION:  SDS                          FACILITY:  [facility]   PHYSICIAN:  Juleen China IV, MDDATE OF BIRTH:  [birth year]   DATE OF PROCEDURE:  02/01/2008  DATE OF DISCHARGE:  02/01/2008                               OPERATIVE REPORT   PREOPERATIVE DIAGNOSIS:  Chronic kidney disease.   POSTOPERATIVE DIAGNOSIS:  Chronic kidney disease.   PROCEDURE PERFORMED:  Left upper arm arteriovenous fistula.   SURGEON:  1. Charlena Cross, MD   ASSISTANT:  Jerold Coombe, PA-C   ANESTHESIA:  Local and MAC.   FINDINGS:  Adequate vein, 4mm; adequate artery, 4 mm.   BLOOD LOSS:  Minimal.   PROCEDURE:  The patient was identified in the holding area and taken to  room 8, and he was placed supine on the table.  MAC anesthesia was  administered.  The left arm was prepped and draped in standard sterile  fashion.  Time-out was called.  Antibiotics were given.  Transverse  incision was made slightly above the antecubital crease.  The median  cubital vein was identified and circled with a vessel loop.  It was  mobilized proximally identifying its junction with the cephalic vein.  The vein was then mobilized distally until adequate length had been  achieved.  The vein was marked for proper orientation.  Next, the  brachial artery was exposed.  It was of adequate caliber, approximately  4 mm.  At this point, the patient was given systemic heparinization.  A  right angle was placed on the distal vein, which was transected, the end  was ligated with a 2-0 silk tie.  There was adequate back bleeding from  the vein.  Next, the artery was occluded with vascular clamps.  An 11-  blade was used to make an arteriotomy, was extended with Potts scissors.  The vein was then spatulated.  An end-to-side anastomosis was created  using running 6-0 Prolene.  Prior to completion of the  anastomosis, the  artery was flushed in antegrade and retrograde fashion.  The anastomosis  was then secured.  There was excellent thrill within the fistula up to  the shoulder.  The patient continued to have palpable radial pulse.  The  wound was then copiously irrigated.  The skin was closed in 2 layers of  3-0 Vicryl and 4-0 Vicryl.  Dermabond was placed on the skin.  The  patient tolerated the procedure well and no complications.          ______________________________  V. Charlena Cross, MD  Electronically Signed    VWB/MEDQ  D:  02/01/2008  T:  02/01/2008  Job:  [id number]

## 2010-08-05 NOTE — Op Note (Signed)
NAME:  Gerald Tucker, Gerald Tucker                 ACCOUNT NO.:  [id number]   MEDICAL RECORD NO.:  [id number]          PATIENT TYPE:  AMB   LOCATION:  SDS                          FACILITY:  [facility]   PHYSICIAN:  Larina Earthly, M.D.    DATE OF BIRTH:  17-Oct-1918   DATE OF PROCEDURE:  02/14/2008  DATE OF DISCHARGE:  02/14/2008                               OPERATIVE REPORT   PREOPERATIVE DIAGNOSIS:  End-stage renal disease.   POSTOPERATIVE DIAGNOSIS:  End-stage renal disease.   PROCEDURE:  Right internal jugular Diatek catheter placement with  ultrasound visualization.   SURGEON:  Larina Earthly, MD   ASSISTANT:  Nurse.   ANESTHESIA:  MAC.   COMPLICATIONS:  None.   DISPOSITION:  To recovery room, stable.   PROCEDURE IN DETAIL:  The patient was taken to the operating room and  placed in the supine position.  The area of the right and left neck were  imaged with ultrasound revealing widely patent jugular veins  bilaterally.  The patient was placed in Trendelenburg position, and the  right and left neck and chest were prepped and draped in the usual  sterile fashion.  Using local anesthesia and a single wall puncture, the  right internal jugular vein was identified.  Guidewire was passed down  the level of the right atrium.  This was confirmed on fluoroscopy.  A  dilator and peel-away sheath was passed over the guidewire, and the  dilator and guidewire were removed.  A 28-cm Diatek catheter was passed  down the peel-away sheath to the level of the right atrium.  The  catheter was brought through a subcutaneous tunnel through a separate  stab incision and the 2-lumen ports were attached.  Both lumens were  flushed and aspirated easily and were locked with 1000 units per mL  heparin.  The catheter was secured to the skin with a 3-0 nylon stitch  and the entry site was closed with a 4-0 subcuticular Vicryl stitch.  Sterile dressing was applied, and the patient was taken to the recovery  room in  stable condition.      Larina Earthly, M.D.  Electronically Signed     TFE/MEDQ  D:  02/14/2008  T:  02/15/2008  Job:  [id number]
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
## 2010-08-05 NOTE — Assessment & Plan Note (Signed)
OFFICE VISIT   Gerald Tucker, Gerald Tucker  DOB:  March 31, 1918                                       10/24/2007  EAVWU#:[id number]   REASON FOR VISIT:  Evaluate for dialysis access.   PRIMARY CARE PHYSICIAN:  Geoffry Paradise, M.D.   HISTORY:  This is an 77 year old gentleman with end-stage renal disease  secondary to hypertension, who is nearing dialysis.  His most recent  creatinine was 3.5.  He is right-handed.   REVIEW OF SYSTEMS:  Negative for fevers, chills, weight gain, weight  loss.  CARDIAC:  Shortness of breath with exertion.  PULMONARY:  Negative.  GI:  Negative.  GU:  Positive for renal disease.  VASCULAR:  Pain in legs with walking.  NEURO:  Negative.  ORTHO:  Positive for arthritis.  PSYCH:  Negative.  ENT:  Negative.  HEME:  Negative.   PAST MEDICAL HISTORY:  1. Hypertension.  2. Hypercholesterolemia.  3. Chronic kidney disease.   FAMILY HISTORY:  Noncontributory.   SOCIAL HISTORY:  Widowed with four children.  Is a farmer.  Does not  smoke.  Has never smoked.  Does not drink.   MEDICATIONS:  Please see medical record.   ALLERGIES:  PAIN MEDICINE.   PHYSICAL EXAMINATION:  Heart rate 52, blood pressure 159/70.  General:  Well-appearing in no acute distress.  HEENT:  Normocephalic and  atraumatic.  Pupils are equal.  Sclerae are anicteric.  Cardiovascular:  Regular rate and rhythm.  No murmurs, rubs or gallops.  Pulmonary:  Lungs are clear bilaterally.  Abdomen:  Soft.  Extremities:  Palpable  brachial and left radial pulse.  Visible median cubital vein.   DIAGNOSTIC STUDIES:  Patient had vein mapping, which found him to have  adequate cephalic vein above the antecubital crease.   ASSESSMENT/PLAN:  Chronic kidney disease:  Patient is scheduled for a  left upper arm AV fistula to be performed on Friday, August 28th.  I  discussed the risks of this procedure, including infection, risk of  steal syndrome, risk of nonmaturity.  Patient is  well aware of this.   Patient does have a significant problem with anesthesia in the past.  I  have told him that I will try to do this under local anesthesia, and we  will make sure we will talk with Dr. Jacky Kindle as well as obtain his old  records to alleviate any potential medication complications.   Jorge Ny, MD  Electronically Signed   VWB/MEDQ  D:  10/24/2007  T:  10/25/2007  Job:  [id number]   cc:   Geoffry Paradise, MD  Garnetta Buddy, M.D.

## 2010-08-05 NOTE — Procedures (Signed)
An 77 year old male was admitted for altered mental status.  The  patient's son stated he had minimum episode of jerking with increased  confusion and increased hallucination afterwards.  He had a history of  chronic renal disease, CAD, nephrosclerosis, hypertension, congestive  heart failure, AV fistula, dyslipidemia, hypoparathyroidism, and venous  insufficiency.   CURRENT MEDICATIONS:  Folic acid, Protonix, Imdur, Norvasc, Lopressor,  Zocor, Renese, Cipro, Hectorol, Haldol, and meclizine.   TECHNICAL COMMENT:  An 18-channel EEG was performed based on standard  international 10/20 system.  Total recording time was 21.6 minutes.  The  17th channel dedicated to EKG with normal sinus rhythm of 56 beats per  minute.   Upon awakening, the posterior background activity was diffusely slow in  the theta range, and reactive to eye opening and closure, but there was  no epileptiform discharge recorded.  Photic stimulation with flash  frequency 1-19 Hz was performed.  There was no abnormality elicited.  The patient was drowsy during the recording, but no deeper stage of  sleep was achieved.   In conclusion, this is a mild abnormal awake EEG.  There is evidence of  mild background slowing, which can indicate mild cerebral  dysfunction,  but there is no evidence of epileptiform discharge.      Levert Feinstein, MD  Electronically Signed     QI:HKVQ  D:  03/12/2008 15:44:06  T:  03/13/2008 05:04:12  Job #:  [id number]

## 2010-08-05 NOTE — Consult Note (Signed)
NAME:  Gerald Tucker, Gerald Tucker                 ACCOUNT NO.:  [id number]   MEDICAL RECORD NO.:  [id number]          PATIENT TYPE:  INP   LOCATION:  2001                         FACILITY:  [facility]   PHYSICIAN:  Aram Beecham B. Eliott Nine, M.D.DATE OF BIRTH:  [birth year]   DATE OF CONSULTATION:  02/06/2008  DATE OF DISCHARGE:                                 CONSULTATION   PRIORITY NEPHROLOGY CONSULTATION   We are asked to see this patient because of advanced chronic kidney  disease.  He is an 77 year old white male, who is followed at [facility] by Dr. Daphine Deutscher Web.  His primary care physician is Dr.  Bernadene Person.  He has known advanced chronic kidney disease.  He was  admitted by Dr. Swaziland today because of progressive dyspnea over the  past 2.5 days.  Exam and chest x-ray were compatible with congestive  heart failure and pulmonary edema.  We were asked to see him because of  his renal failure.   The patient has been followed by Dr. Hyman Hopes in our office since earlier  this year, with progressive kidney disease,  and there have been  numerous discussions about the eventual need for hemodialysis.  He  underwent primary AV fistula placement on 02/01/08 by Dr. Myra Gianotti but  declined to have a PermCath placed, in spite of Dr. Marland Mcalpine  recommendations to undergo both.  He saw Dr. Hyman Hopes in followup on the  13th and reported feeling okay.  Creatinine was 4.6.  He still did not  want to undergo PermCath placement for initiation of dialysis.   Over the course of the past 3 days, he has developed progressive dyspnea  to the point where he presented today with significant shortness of  breath in Dr. Elvis Coil office.  His exam and x-ray (I cannot locate this  x-ray) were apparently both compatible with CHF and he was subsequently  admitted.   PAST MEDICAL HISTORY:  1. Chronic kidney disease, stage 5.      a.     Status post AV fistula, left upper arm, February 01, 2008 -       refused PermCath at that  time.  2. Coronary artery disease with history of a distal LAD lesion on cath      - on medical management; negative Cardiolite, May 2008.  3. Hypertension.  4. Dyslipidemia.  5. History of back surgeries.  6. History of cholecystectomy.  7. Anemia.  8. Mild secondary hyperparathyroidism with intact PTH of 115, August      2009.  9. PENICILLIN ALLERGY.   CURRENT MEDICATIONS:  1. Lasix 120 mg IV every 8 hours (outpatient dose was 120 in the am,      80 early afternoon).  2. Meclizine 25 mg b.i.d.  3. Simvastatin 20 mg a day.  4. Ranexa 500 mg a day.  5. Folate 1 mg per day.  6. Omeprazole 20 mg b.i.d.  7. Aspirin 81 mg a day.  8. Isosorbide 60 mg b.i.d.  9. Gabapentin 300 mg b.i.d.  10.Amlodipine 5 mg per day.  11.Vitamin D 1 g per day.  12.Metoprolol 50 mg b.i.d.   FAMILY HISTORY:  Not contributory.   SOCIAL HISTORY:  Patient has been a widower for the past 7 years.  Has a  very supportive family.  Lives alone.  He is retired from the [employer].  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  Has a grandson, who just died over the past weekend.   REVIEW OF SYSTEMS:  Positive for progressive dyspnea over the past  several days.  He has had edema off and on for 2 years.  His sleep was  off this past weekend.  Appetite is good.  He has some chronic back  issues.  He denies any nausea, vomiting, or abdominal pain.  Has had  some issues with jerking   PHYSICAL EXAMINATION:  GENERAL:  He is a very nice older gentleman, very  pleasant, but with obvious increased work of breathing.  VITAL SIGNS:  Blood pressure 140/60, O2 SAT was 84% on room air, 93% on  2 L of oxygen, temperature 97.5, weight 95 kg.  NECK:  He had JVD about 5 cm above the clavicle.  LUNGS:  Bilateral crackles one-half way up in both lung fields  posteriorly with expiratory wheezing.  CARDIAC:  Rhythm was regular, S1 S2 normal, no audible S3, heart sounds  were somewhat  distant.  ABDOMEN:  Normally active bowel sounds with no focal abdominal  tenderness.  EXTREMITIES:  2+ pretibial edema to the knees, he has a left upper arm  AV fistula with edema and bruising and has some dependent hematoma in  the lower forearm as well.  Asterixis was present, along with some  intermittent myoclonic jerks.  He has trace to 1+ sacral edema.   LABORATORY DATA:  Sodium 139, potassium 4.8, chloride 102, CO2 28, BUN  58, creatinine 4.76 with an EGFR of around 12, phosphorus 3.7, calcium  8.8, SGOT 34, SGPT 62, bilirubin 1.5, BNP 753, troponin 0.02, hemoglobin  10.5, WBC 11,300.  Chest x-ray - per Dr. Swaziland shows cardiomegaly and  interstitial edema - I cannot actually locate this particular film.   IMPRESSION:  An 77 year old white male with hypertension, coronary  disease, and known renal failure, who has:  1. Advanced chronic kidney disease - presents with progressive      shortness of breath and fluid overload, which is likely related to      his underlying renal failure.  He is ambivalent, at best, about      undergoing PermCath placement and committing to dialysis despite      the fact that he has an arteriovenous (AV) fistula (which will not      be ready to use for 8 weeks).  He wishes to see how he does with      intravenous diuretics.  I had a quite long discussion with the      patient, his 3 sons, and his daughter about the pros and cons of      proceeding with dialysis.  He will give it some thought and let us      know of his decision in the a.m. and hopefully in the interim he      will get some diuretic response to the intravenous Lasix.  Agree      with current dosing of  Lasix.  We will put a red arm band on his      left arm to avoid inadvertent blood pressure check or a  blood draw      from his arteriovenous fistula arm.  Would restrict to 1500 mL a      day.  Strict intake and output and daily weights.  Will check an      intact PTH as well as iron  studies given his anemia.  We will      reassess him in the a.m. and he and his family will discuss      PermCath prior to that time.  2. Congestive heart failure as above.  3. Anemia not on Procrit or Aranesp (at least not out of our office).      Check iron studies.  4. Secondary hyperparathyroidism - last was about 115 in August.      Needs to be followed up.  5. Neuropathic pain (? related to previous back issues) - Gabapentin      dose is probably a little high for his current GFR.  Would reduce      to 300 mg daily.   Thanks for asking [place] to see him - will follow with you.      Duke Salvia Eliott Nine, M.D.  Electronically Signed     CBD/MEDQ  D:  02/06/2008  T:  02/06/2008  Job:  [id number]

## 2010-08-05 NOTE — Consult Note (Signed)
NAME:  NADEEM, ROMANOSKI                 ACCOUNT NO.:  [id number]   MEDICAL RECORD NO.:  [id number]          PATIENT TYPE:  INP   LOCATION:  6737                         FACILITY:  [facility]   PHYSICIAN:  Antonietta Breach, M.D.  DATE OF BIRTH:  Oct 01, 1918   DATE OF CONSULTATION:  03/12/2008  DATE OF DISCHARGE:                                 CONSULTATION   ADDENDUM:  If Mr. Fedak does redevelop hallucinations and confusion that  is severe, would check an EKG QTC prior to using a standing  antipsychotic and if less than 500 ms would then start Zyprexa as an  initial dose at 2.5 mg p.o. or IM every 1800.      Antonietta Breach, M.D.  Electronically Signed     JW/MEDQ  D:  03/12/2008  T:  03/12/2008  Job:  [id number]

## 2010-08-05 NOTE — Op Note (Signed)
NAME:  Gerald Tucker, Gerald Tucker                 ACCOUNT NO.:  [id number]   MEDICAL RECORD NO.:  [id number]          PATIENT TYPE:  INP   LOCATION:  2550                         FACILITY:  [facility]   PHYSICIAN:  Stefani Dama, M.D.  DATE OF BIRTH:  [birth year]   DATE OF PROCEDURE:  07/26/2006  DATE OF DISCHARGE:                               OPERATIVE REPORT   PREOPERATIVE DIAGNOSIS:  Spondylolisthesis, spondylosis and stenosis L2-  3, L3-4, L4-5, and L5-S1, lumbar radiculopathy, scoliosis.   POSTOPERATIVE DIAGNOSIS:  Spondylolisthesis, spondylosis and stenosis L2-  3, L3-4, L4-5, and L5-S1, lumbar radiculopathy, scoliosis.   PROCEDURE:  Lumbar decompression L2 to S1 decompressing L2, L3, L4 and  L5 nerve roots bilaterally, posterior interbody arthrodesis with PEEK  spacer L3-4 and L4-5 segmental fixation L2-S1 with pedicle screws,  posterolateral arthrodesis with local autograft and allograft L2-S1.   SURGEON:  Stefani Dama, M.D.   FIRST ASSISTANT:  Dr. Shirlean Kelly.   ANESTHESIA:  General endotracheal.   INDICATIONS:  Gerald Tucker is an 77 year old individual has previously  had a laminectomy done by Dr. Simonne Come a few years ago.  He underwent  decompression at L3-4, L4-5.  He notes that he had no relief after his  surgery and had continued problems with low back pain and leg pain and  ultimately he had a myelogram which demonstrates severe spondylitic  stenosis at L5-S1 secondary to grade 2 to grade 3 spondylolisthesis.  He  also has a severe spondylitic stenosis with scoliosis, the concave  portion being on the right side at L4-5 and L3-4.  He has evidence of  radiculopathy involving the L4 and L5 nerve roots bilaterally.  He had  been advised regarding surgical decompression and stabilization but it  was initially opted that he should undergo conservative course of  treatment.  The patient notes that his level of function has  deteriorated such that he is willing to undergo the  risks of surgery in  order to achieve some improvement.  He was taken to the operating room.   PROCEDURE:  The patient was brought to the operating room supine on the  stretcher.  After smooth induction of general endotracheal anesthesia he  was turned prone and the bony prominences were appropriately padded and  protected.  The back was then prepped with DuraPrep and draped in  sterile fashion.  A midline incision was created and carried down to the  lumbodorsal fascia which was opened on either side of midline.  This  exposed the spinous processes of L3.  L4 and L5 were already removed in  large part from this previous laminectomy.  We dissected cephalad and  inferiorly to expose these areas laterally and the common dural tube was  identified.  Thick, adherent scar was encountered and this was carefully  dissected from the center of central portion of the common dural tube.  Dissection went slowly but despite being extremely careful with a  durotomy occurred in the central portion of the dura approximately at  the level of L3-L4.  This was denuded, the borders were identified and  then this was closed primarily was 6-0 Prolene suture.  A while later  while dissecting out the lateral gutters, particularly on the left side  at L2-L3 a second dural rent had occurred.  This was again repaired with  6-0 Prolene suture.  However, the dura was noted to be so frayed that  good repair was not possible.  Further dissection in this area  identified several other areas of frayed dura.  These were gently  dissected and a care was taken to dissect around and decompress each of  the individual nerve roots, namely L2, L3, L4, L5 and sacrum.  The  dissection was carried out far laterally to assure that the nerve roots  were well decompressed in their travel out the foramen.  On the right  side there was noted to be particularly lateral compression involving  the L4 nerve root superiorly and the L3 nerve  root.  A diskectomy was  performed on this right side at L3-4, L4-5 moving a large posterior  ridge of bone and disk and then entering the severely collapsed disk  space and dissecting this open to allow for good decompression of the L3  nerve root superiorly from L3-4 and L4 nerve root superiorly on the  right side at L4-3, 4-5.  PEEK spacers were used to open the space up  from the right side and a 9 mm TLIF spacer was placed in L2-3, 9 mm PLIF  spacer was placed on the right side at L3-L4 after completely  decorticating the interspace and removing a substantial quantity of  severely degenerated disk material and making sure that the graft  surfaces were prepared and then the peak spacers were filled with a  combination of autograft and allograft.  The allograft consisted of  Osteocell.  This material was packed into the interspace at L3-4 and L4-  5 also.  At L5-S1 the diskectomy was performed on the right side to  decompress the L5-S1 nerve roots.  There was noted be significant  anterolisthesis with severe sclerotic formation at the area of the disk  space.  The interspace was not easily mobile and attempts to move it  with a thin osteotome was met with substantial resistance.  It was  decided at this point that decompression and fixation and fusion at the  L5-S1 space would best be achieved posterolaterally as the interspace  was not easily accessible and there was significant fear of traction  injury to the L5 nerve roots, particularly if an attempt at a posterior  interbody arthrodesis was made.  With this, noting that there was good  decompression of lateral gutters, pedicle entry sites were then chosen  at L2, L3, L4, and L5.  These were done sequentially independently of  each other using fluoroscopic guidance and monitoring.  6.5 x 50 and 55  mm screws were placed in each in S1, L5, at L4, L3 and L2.  Sacral screws were placed in the sacrum.  These were 7.75 mm. 45 mm screw  was  placed on the right side and 35 mm screw was placed on the left side.  Screw heads were aligned to allow placement of a singular rod.  It was  found that a pre contoured rod needed of approximately 10 degrees of  additional contoured to fit between the screw heads from L2 to the  sacrum.  Once these were fashioned into the appropriate size and shape  they were fitted into the inner holes and were  locked into position.  Posterolateral gutters were then filled with combination of autograft  and allograft, care being taken to make sure that the posterolateral  gutters were adequately decorticated.  Once the arthrodesis was  completed the wound was inspected carefully.  The shredded areas of dura  had been tamponaded with Gelfoam soaked in thrombin.  Gelfoam and  thrombin were removed and the area was coated with a layer of Tisseel.  No gross spinal fluid leakage was noted after this was accomplished.  Lumbodorsal fascia was then reapproximated #1 Vicryl interrupted  fashion, 2-0 Vicryl was used in subcutaneous and subcuticular tissues, 3-  0 Vicryl subcuticularly and dry sterile dressing was placed on the skin.  2 liters of blood was estimated blood loss for this procedure.  600 to  800 mL of blood was returned from the Cell Saver.  In addition, the  patient received 2 units packed cells during the case.   CONDITION ON LEAVING THE OPERATING ROOM:  Stable.      Stefani Dama, M.D.  Electronically Signed     HJE/MEDQ  D:  07/26/2006  T:  07/27/2006  Job:  [id number]

## 2010-08-05 NOTE — Discharge Summary (Signed)
NAME:  Gerald Tucker, Gerald Tucker                 ACCOUNT NO.:  [id number]   MEDICAL RECORD NO.:  [id number]          PATIENT TYPE:  INP   LOCATION:  6737                         FACILITY:  [facility]   PHYSICIAN:  Geoffry Paradise, M.D.  DATE OF BIRTH:  1918-08-31   DATE OF ADMISSION:  03/09/2008  DATE OF DISCHARGE:  03/13/2008                               DISCHARGE SUMMARY   DIAGNOSES AT THE TIME OF DISCHARGE:  1. Confusion, myoclonus and hallucinations secondary to metabolic      encephalopathy, likely dialysis medication related  2. End-stage renal disease, hemodialysis dependent.  3. Arteriosclerotic coronary artery disease, nonobstructive, medically      treated.  4. Essential hypertension.  5. Congestive heart failure secondary to volume overload.  6. Dyslipidemia.  7. Chronic pain secondary to degenerative disk disease, lumbosacral      arthritis and osteoarthritis.  8. Benign positional vertigo.   HISTORY OF PRESENT ILLNESS:  Gerald Tucker is an 76 year old gentleman well-  known to myself with chronic pain and gait instability largely secondary  to a combination of degenerative disk disease and osteoarthritis with  radiculopathies, ASCAD, hypertension, CHF and dyslipidemia, who has  recently been started on hemodialysis secondary to end-stage renal  disease, presenting at this time with myoclonic jerks, confusion and  hallucinations.  He evidently had a recent hospital stay that I was not  involved in about 1 month prior with volume overload at the time, at  which dialysis was started, and chronologically his myoclonic jerking  started as well.  Over the last several days he has had progressive  confusion, increasing hallucinations, and continued myoclonic jerks.  Family did contact myself and Dr. Hyman Hopes and I had a personal conversation  with Dr. Hyman Hopes that I felt as though this was metabolic; he was to have  been evaluated by the once to twice weekly physician in dialysis.  He  presents to the  emergency room via Dr. Marland Mcalpine office with subsequent  symptoms and altered mental status for evaluation.  We were contacted  for admission under the presumption that this was perhaps a urinary  tract infection.  He has had no further congestive heart failure  symptoms and no anginal type chest pain.  He for the most part lives  alone but does have a helper who stays varying degrees of time with him.  For details, see the dictated summary in the chart.   DATA:  CT of the brain:  No acute intracranial abnormalities,  disproportionate frontotemporal and insular volume loss perhaps  reflecting frontotemporal lobar degeneration as well as inflammation of  the right maxillary sinus.  CBC:  Hemoglobin was 11.9, hematocrit 36.2,  white blood count 9.8, platelet count is 263,000.  Chemistry:  Sodium  136, potassium 4.9, chloride 100, CO2 was 28, glucose 106, BUN 25,  creatinine 6.56, bilirubin is 0.9, alk phos 79, SGOT 32, SGPT 28,  protein 7.2, albumin 3.6, calcium 8.5, phosphorus 5.8, ammonia was 23,  CKs were 189 and 212.  Troponins 0.03 x2.  Urinalysis did reveal  moderate blood, positive nitrate, few leukocytes, 3-6 white cells,  0-2  red cells.  Blood culture was negative.  Urine culture was 85,000  colonies Pseudomonas, pansensitive.   HOSPITAL COURSE:  The patient was admitted.  It was felt by the primary  service that this was not indeed a UTI type picture as colony count was  more compatible with colonization.  He was treated with some Haldol and  underwent significant medication simplification.  He was treated  empirically with Cipro and did receive a few doses of this, but more  importantly much of his analgesics, meclizine, Neurontin were all  discontinued.  He does get medications from the [place] and I did have a long  talk with helper as well, making certain these are minimized.  In  addition to the medication listed upon presentation, specifically the  gabapentin, he also has some  Darvocet-N 100 at home per our prior  records.  Regardless, we did have consultation from renal, psychiatry,  and neurology.  It was felt that most likely this was metabolic as well.  He gradually cleared with simplification of medications and dialysis.  On the morning of discharge he is awake and alert, appropriate.  His  helper is at the bedside, both insist upon discharge and appropriately  so, as maximal hospital benefit has been gained.  EEG has been done and  is completed, results pending.  This should not alter disposition in any  way.  He is functionally at baseline, eating well, and vital signs were  stable.  He has remained stable from a cardiopulmonary standpoint.  He  has had no glycemic issues.  He will be discharged in improved and  stable condition with further __________ as an outpatient.   DISCHARGE MEDICATIONS WILL INCLUDE:  Aspirin 81 mg daily, folate 1 mg  daily, omeprazole 20 mg daily, Imdur 60 mg daily, metoprolol 50 p.o.  b.i.d., simvastatin 20 mg daily, Ranexa 500 mg daily, Mycostatin powder  t.i.d. to groin, renal formula vitamin daily, doxercalciferol/vitamin D  daily, MiraLax as needed for constipation, Tylenol only for pain.  His  Neurontin has been discontinued, no other analgesics, and meclizine has  been discontinued.   He is to continue with dialysis and dialysis instructions as well as  catheter care as instructed by the dialysis team.  He will follow up  with dialysis Monday, Wednesday, Friday, see the physician weekly there  and myself quarterly.  Cipro has not been continued as insignificant  growth was obtained on urine and he did receive several doses in-house.  This was not felt to be related to a UTI.  The patient is discharged in  improved and stable condition.           ______________________________  Geoffry Paradise, M.D.     RA/MEDQ  D:  03/13/2008  T:  03/13/2008  Job:  [id number]   cc:   Garnetta Buddy, M.D.

## 2010-08-05 NOTE — Consult Note (Signed)
NAME:  Gerald Tucker, Gerald Tucker                 ACCOUNT NO.:  [id number]   MEDICAL RECORD NO.:  [id number]          PATIENT TYPE:  INP   LOCATION:  6737                         FACILITY:  [facility]   PHYSICIAN:  Antonietta Breach, M.D.  DATE OF BIRTH:  12/23/1918   DATE OF CONSULTATION:  03/12/2008  DATE OF DISCHARGE:                                 CONSULTATION   REASON FOR CONSULTATION:  Psychosis.   HISTORY OF PRESENT ILLNESS:  [name] is an 77 year old male  admitted to the [facility] on December 18 with acute mental status  changes and end-stage renal failure.  [name] developed approximately  7 days prior to admission, myoclonic jerks along with confusion, he was  also having visual hallucinations.   He has been treated on the renal floor and his memory is still mildly  impaired for short-term, his long-term memory is intact.  His judgment  this morning has improved back to his estimated baseline.   He is not agitated or combative.  He is cooperative and appropriate with  staff.  His orientation is completely intact.   The precipitating factors appear to be an exacerbation of his general  medical problems.  It does appear that his general medical care has  resulted in an improvement in his symptoms.   PAST PSYCHIATRIC HISTORY:  [name] denies any history of mood, anxiety  or periods of confusion or memory gaps in the past.   In review of the past medical record there is no note of any delirium  periods including the period in June of 2008 where he underwent surgical  decompression and stabilization of his lumbar spine.   FAMILY PSYCHIATRIC HISTORY:  None known.   SOCIAL HISTORY:  [name] is widowed.  He grew up on a farm in [place], he was then drafted into the [employer].  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  He has suffered  chronic pain since then.   After he left [employer] in 1944 he went on to a [employer].   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  He has a coworker that helps him and also has helped provide  some caretaking at night.   He denies alcohol or illegal drugs.  He has four children with two of  them in the area providing regular support.  He is a devout Protestant  regarding his religion.  He has been living alone.  His pre-morbid  baseline involves one of intact memory and continuing to work as  described above.   PAST MEDICAL HISTORY:  1. End-stage renal disease, hemodialysis dependent.  2. Bilateral cataracts.  3. History of several spine surgeries.  4. Cholecystectomy.  5. Obstructive sleep apnea on CPAP.  6. Gastroesophageal reflux disease.  7. Venous insufficiency.  8. Congestive heart failure.  9. Coronary artery disease.  10.Nephrosclerosis.  11.Hypertension.   MEDICATIONS:  The MAR is reviewed.  [name] is not on any current  standing  psychotropics, he does have Haldol ordered 1 mg q.12 hours  p.r.n.   ALLERGIES:  PENICILLIN, MORPHINE SULFATE AND CODEINE.   He did require Haldol 1 mg yesterday for the hallucinations in the  afternoon.   LABORATORY DATA:  His ammonia level yesterday had been assessed at 23  within normal limits, CK 212.  On December 19 sodium 133, BUN 32,  creatinine 7.39, SGOT 24, SGPT 18, WBC 8.3, hemoglobin 11.6, platelet  count 181.   Head CT on March 09, 2008 showed no acute intracranial abnormality.   REVIEW OF SYSTEMS:  The patient is able to provide some of this.  His  current medical record and past record are reviewed.  Constitutional,  head, eyes, ears, nose, throat, mouth, neurologic, psychiatric,  cardiovascular, respiratory, gastrointestinal, genitourinary, skin,  musculoskeletal, hematologic, lymphatic, endocrine, metabolic all  unremarkable.   EXAMINATION:  VITAL SIGNS:  Temperature 98.1, pulse 65, respiratory rate  19, blood pressure 120/54, O2  saturation on 2 liters 97%.  GENERAL APPEARANCE:  [name] is an elderly male lying in a supine  position in his hospital bed with no abnormal involuntary movements.   MENTAL STATUS EXAM:  [name] is alert, his concentration is mildly  decreased.  His eye contact is good.  His attention span is slightly  decreased.  Affect is slightly flat at baseline, however, there is some  expressed and congruent range.  His mood is within normal limits.  On  orientation testing he is intact to the year, month, day of the month,  date of the week, place and person.   Memory testing:  3/3 immediate, 1/3 on recall.  Fund of knowledge and  intelligence are grossly within normal limits.  Speech involves normal  rate and prosody without dysarthria.  Thought process is logical,  coherent, goal-directed.  No looseness of associations.  Thought  content:  No thoughts of harming himself, no thoughts of harming others,  no delusions, no hallucinations.  Insight is intact, judgment is  currently intact.   ASSESSMENT:  AXIS I:  293.00 delirium not otherwise specified.  This  does appear to be related to the exacerbation of his end-stage renal  disease with renal failure.  He also has a baseline of an older central  nervous system with indications of atrophy on the CT of his brain.  He  also has a mild anemia with a consistent mildly decreased hemoglobin  going back to November 16.  It does appear that his delirium is improved  this morning, however, there is a typical waxing and waning course with  delirium and further observation will confirm whether the delirium has  passed.  AXIS II:  None.  AXIS III:  See past medical history.  AXIS IV:  General medical.  AXIS V:  40.   [name] is not at risk to harm himself or others.  He will call  emergency services for any mental emergencies.   The undersigned provided education, at this time he is able to absorb  and express information.   RECOMMENDATIONS:   If [name] does have a return of significant  hallucinations and confusions would then utilize Zyprexa 2.5 mg p.o. q.  18:00, monitoring for any rigidity or other extrapyramidal side effects.   However, if the confusion and potential hallucinations are mild would  just allow further time for the delirium to completely clear as it does  appear that the acute cause is being reversed   If Mr. Erion does  continue with a baseline of mild impairment and short-  term recall, would complete a reversible memory dysfunction etiology  workup with RPR, B12, folic acid, TSH, and if negative would consider  Aricept 5 mg nightly as the initial trial dose.   It does appear that with his already improving short-term recall that he  will be capable of returning to his baseline environment if his delirium  completely clears (if he does not wax and wane persistently).  Next,  would keep memory and orientation cues in the room.      Antonietta Breach, M.D.  Electronically Signed     JW/MEDQ  D:  03/12/2008  T:  03/12/2008  Job:  [id number]

## 2010-08-05 NOTE — Assessment & Plan Note (Signed)
OFFICE VISIT   Gerald Tucker, Gerald Tucker  DOB:  01-Mar-1919                                       02/13/2008  XBJYN#:[id number]   The patient is a patient of Dr. Myra Gianotti who created a left upper arm AV  fistula on November 11 for end-stage renal disease, although he has not  yet received hemodialysis.  The fistula is functioning nicely with an  excellent pulse and palpable thrill, and well-healed incision.  There is  no evidence of significant steal distally with a palpable radial pulse  of 2 to 3+ and occasional mild tingling in his left hand.  Dr. Hyman Hopes has  requested [place] proceeding with insertion of a Diatek catheter and we will  schedule that for Wednesday, November 25 as an outpatient at [facility].  I have discussed this with the patient and his family today  and we will proceed on that date.   [facility], M.D.  Electronically Signed   JDL/MEDQ  D:  02/13/2008  T:  02/14/2008  Job:  Elmo Putt

## 2010-08-05 NOTE — Assessment & Plan Note (Signed)
OFFICE VISIT   Gerald Tucker, Gerald Tucker  DOB:  Nov 02, 1918                                       02/25/2009  EAVWU#:[id number]   REASON FOR VISIT:  Leg pain.   HISTORY:  This is an 77 year old gentleman who I am seeing at the  request of Dr. Wynelle Cleveland for evaluation of bilateral leg pain.  The  patient states that he has what he describes as cramping in his calves  that can occur at rest which has been going on for the past 2 years.  However, recently it has become more of an issue for him.  While in  dialysis this past week, he had to get unhooked from the system due to  severe left foot pain.  He does not have any aggravating or relieving  factors.   The patient has a history of multiple back surgeries.  He does have  chronic back pain.  He also has end-stage renal disease.  He dialyzes  Monday, Wednesday, Friday through a left upper arm fistula.  His  hypertension and hypercholesterolemia are well controlled with  medications.   PAST MEDICAL HISTORY:  End-stage renal disease, hypertension,  hypercholesterolemia, chronic back pain.   REVIEW OF SYSTEMS:  Negative for chest pain, negative for shortness of  breath.  No ulceration.  All others are negative as documented in the  encounter form.   FAMILY HISTORY:  Negative for cardiovascular disease at an early age.   SOCIAL HISTORY:  He is widowed with 4 children.  He is a Visual merchandiser.  He  does not smoke.  Has never smoked.  He does not drink.   ALLERGIES:  To pain medicine.   PHYSICAL EXAMINATION:  Heart rate 70, blood pressure 176/62, temperature  is 97.5.  General:  He is well-appearing, in no distress.  Head:  Normocephalic, atraumatic.  ENT:  Pupils equal.  Sclerae anicteric.  Neck:  Supple.  No JVD.  No carotid bruits.  Lungs:  Clear bilaterally.  Cardiovascular:  Regular rate and rhythm. Pedal pulses not palpable.  Abdomen:  Soft, nontender.  Musculoskeletal:  No ulceration.  Skin:  Without rashes.  Psych:   He has normal affect.   DIAGNOSTIC STUDIES:  I have independently reviewed his ultrasound.  This  reveals ankle-brachial index of 0.86 on the right and 0.5 in the left.   ASSESSMENT/PLAN:  Peripheral vascular disease.   PLAN:  I told the patient that I think that his symptoms are  multifactorial in etiology; however, I do believe he has an arterial  component to his problems.  I have recommended that we proceed with an  arteriogram.  I plan on accessing the right groin, studying both legs.  If there is any intervention, I would proceed with a left leg  intervention.  The patient understands that this may not completely  alleviate his pain.  He is in agreement and wishes to proceed.  I plan  on doing this on Thursday, March 14, 2009.   Jorge Ny, MD  Electronically Signed   VWB/MEDQ  D:  02/25/2009  T:  02/26/2009  Job:  [id number]   cc:   Tinnie Gens A. Petrinitz, D.P.M.

## 2010-08-05 NOTE — H&P (Signed)
NAME:  Gerald Tucker, ESSON NO.:  [id number]   MEDICAL RECORD NO.:  [id number]          PATIENT TYPE:  IPS   LOCATION:  NA                           FACILITY:  [facility]   PHYSICIAN:  Ellwood Dense, M.D.   DATE OF BIRTH:  [birth year]   DATE OF ADMISSION:  DATE OF DISCHARGE:                              HISTORY & PHYSICAL   PRIMARY CARE PHYSICIAN:  Geoffry Paradise, M.D.   NEUROLOGIST:  Deanna Artis. Sharene Skeans, M.D.   NEUROSURGEON:  Stefani Dama, M.D.   CARDIOLOGIST:  Peter M. Swaziland, M.D.   HISTORY OF PRESENT ILLNESS:  The patient is an 77 year old male with  history of prior lumbar laminectomy 10 years ago.  The patient was  admitted on 07/26/2006 with progressive low back pain.  X-rays and  myelogram had shown spondylolisthesis along with spondylosis and  stenosis and radiculopathy at L2-3, L3-4, L4-5 and L5-S1.  The patient  underwent decompressive L2-S1, L4-5 and PEEK spacer at L3-4 and L4-5 on  07/26/2006 performed by Dr. Danielle Dess.  Postoperative followup critical  care medicine was called for a creatinine of 2.8-3.81.  This was felt to  be questionable chronic versus acute.  His in's and out's were  monitored.  He was found to have a hemoglobin of 8.2 and was transfused  two units of packed red blood cells.  Dr. Sharene Skeans from neurology saw  the patient for delirium which improved after discontinuation of PCA  pump.  He has had limited use of Haldol.  Subcutaneous Lovenox was added  for DVT prophylaxis.  Lumbar corset is used when out of bed.  Chest x-  ray on 07/31/2006 showed low lung volumes with basilar atelectasis.  The  patient was placed on empiric Ceftin per his primary care physician on  08/03/2006 for 7 days duration.   The patient was evaluated by the rehabilitation physicians and felt to  be an appropriate candidate for inpatient rehabilitation.   REVIEW OF SYSTEMS:  Positive for reflux and lumbago.   PAST MEDICAL HISTORY:  1. Chronic renal  insufficiency with old records indicating a      creatinine of 2.9 as of November 2006.  2. Hypertension.  3. Gastroesophageal reflux disease.  4. Paroxysmal atrial fibrillation.  5. Obstructive sleep apnea, using CPAP.  6. Multiple cervical surgeries.  7. Prior tonsillectomy.  8. Left shoulder surgery.  9. Prior appendectomy.  10.Prior cholecystectomy.  11.Left sacral, right clavicle and multiple rib fractures secondary to      trauma.  12.History of lumbar laminectomy 10 years ago.   FAMILY HISTORY:  Positive for coronary artery disease.   SOCIAL HISTORY:  The patient lives alone.  He has been a farmer in the  past.  He lives in a one level home with ramp to enter.  He does not use  alcohol or tobacco.  He plans to hire assistance as needed after  discharge.  His family are local but they work.   FUNCTIONAL HISTORY PRIOR TO ADMISSION:  Independent using a walker.   ALLERGIES:  PENICILLIN, MORPHINE SULFATE.   MEDICATIONS PRIOR TO ADMISSION:  1. Neurontin 300 mg two tablets daily.  2. Norvasc 10 mg 1/2 tablet daily.  3. Imdur 60 mg daily.  4. Metoprolol 50 mg b.i.d.  5. Lasix 40 mg q.a.m. and 20 mg q.p.m.  6. Omeprazole 20 mg b.i.d.  7. Folic acid daily  8. Aspirin 81 mg daily.   LABORATORY DATA:  Recent hemoglobin was 10.7 with hematocrit 32,  platelet count 329,000, white count 10.  Recent sodium 137, potassium  3.6, chloride 99, CO2 27, BUN 55, creatinine 3.46.   PHYSICAL EXAMINATION:  GENERAL APPEARANCE:  Well-appearing, elderly  adult male sitting on the edge of the bed with lumbar corset in place.  He complains of mild pain, usually relieved with pain medicines.  VITAL SIGNS:  Blood pressure 184/72, pulse 69, respiratory rate 20,  temperature 99.  HEENT:  Normocephalic, atraumatic.  CARDIOVASCULAR:  Irregular rate and rhythm S1, S2, without murmurs.  ABDOMEN:  Soft, slightly obese, nontender, with positive bowel sounds.  LUNGS:  Clear to auscultation with  slightly decreased breath sounds at  the bases.  NEUROLOGICAL:  Alert and oriented x3.  Cranial nerves II-XII intact.  EXTREMITIES:  Bilateral upper extremity exam showed 4+ to 5-/5 strength  throughout, bulk and tone normal, reflexes 2+ and symmetrical, sensation  was intact to light touch throughout bilateral upper extremities.  Lower  extremity exam showed hip flexion, knee extension and ankle dorsiflexion  at 4-/5.  Sensation was intact to light touch throughout bilateral lower  extremities.  BACK:  Well healing lumbar wound with dressing and lumbar corset  covering the wound.   IMPRESSION:  1. Status post lumbar decompression L2-L5 with PEEK spacer at L3-4 and      L4-5 performed 07/26/2006 by Dr. Danielle Dess, postoperative day 10.  2. Postoperative anemia with followup labs.  3. Hypertension, on multiple medications.  4. Chronic renal insufficiency.  5. Obstructive sleep apnea with CPAP.  6. Postoperative delirium, resolving.  7. Presently the patient has deficits in ADLs, transfers and      ambulation secondary to the above-noted lumbar decompressive      surgery.   PLAN:  1. Admit to the rehabilitation unit for daily therapy to include      physical therapy for range of motion, strengthening, bed mobility,      transfers, pre gait training, gait training and equipment      evaluation.  2. Occupational therapy for range of motion, strengthening, ADLs,      cognitive/perceptual training, splinting, and equipment evaluation.  3. Rehab nursing for skin care, wound care, bowel and bladder training      as necessary.  4. Case management to assess home environment, assist with discharge      planning and arrange for appropriate followup care.  5. Social Worker to assess family and social support, and assist in      discharge planning.  6. Check admission labs including CBC and CMET on Friday 08/06/2006. 7. Subcutaneous Lovenox 30 mg subcutaneous daily for DVT prophylaxis.  8.  Continue Nu Iron 150 mg p.o. b.i.d. for postoperative anemia.  9. Monitor hypertension on Lasix 40 mg p.o. daily along with Imdur 60      mg daily, metoprolol 50 mg p.o. b.i.d., Norvasc 15 mg daily.  10.Continue potassium chloride 20 mEq p.o. daily for supplementation      on diuretics.  11.Lumbar corset when out of bed.  12.Ceftin 500 mg p.o. b.i.d. until 08/09/2006, then stop, for      treatment of bibasilar  atelectasis.  13.Continue Protonix 40 mg p.o. daily.  14.Folic acid 1 mg p.o. daily.  15.Aspirin 81 mg p.o. daily.  16.Strict in's and out's.  17.CPAP at current settings at bedtime through a.m.  18.Bed alarm for safety.  19.Dulcolax suppository one per rectum daily p.r.n.  20.Old EKG to chart.  21.Routine turning to prevent skin breakdown.   PROGNOSIS:  Good.   ESTIMATED LENGTH OF STAY:  8-12 days.   GOALS:  Modified independent ADLs, transfers and ambulation short  distances using a walker.           ______________________________  Ellwood Dense, M.D.     DC/MEDQ  D:  08/05/2006  T:  08/05/2006  Job:  [id number]

## 2010-08-05 NOTE — Discharge Summary (Signed)
NAME:  Gerald Tucker, Gerald Tucker                 ACCOUNT NO.:  [id number]   MEDICAL RECORD NO.:  [id number]          PATIENT TYPE:  INP   LOCATION:  2001                         FACILITY:  [facility]   PHYSICIAN:  Peter M. Swaziland, M.D.  DATE OF BIRTH:  1918/07/02   DATE OF ADMISSION:  02/06/2008  DATE OF DISCHARGE:  02/09/2008                               DISCHARGE SUMMARY   HISTORY OF PRESENT ILLNESS:  Gerald Tucker is an 77 year old white male with  history of chronic renal failure, coronary disease, and hypertension who  presented to my office on February 06, 2008, with marked increase in  shortness of breath over 2-1/2 day period.  His dyspnea worsened over  this period of time.  He had no cough, fever, or chills.  He did note  increasing in the symptoms of indigestion and lack of energy.  On  evaluation in our office, the patient was markedly dyspneic.  His sats  were 79-84% on room air.  His chest x-ray showed pulmonary edema with  Gerald Tucker B lines that was new compared to February 01, 2008.  He was  admitted for further evaluation and management.  It is noteworthy that  the patient had just had AV fistula placed in his left arm on February 01, 2008, in anticipation of the need to go on dialysis.   For details of his past medical history, social history, family history,  and physical exam, please see admission history and physical.   LABORATORY DATA:  His ECG at rest was normal.  His chest x-ray showed  mild pulmonary edema, elevation of right hemidiaphragm, Kerley B lines,  and low volumes.  His hemoglobin was 10.5, his white count was 11,300.  Sodium 139, potassium 4.8, chloride 102, CO2 of 28, BUN 58, creatinine  4.76 with estimated GFR of 12.  Calcium was 8.8, phosphorous 3.7.  BNP  level was elevated at 753.  Troponin was 0.02 and CPK-MB was normal.   HOSPITAL COURSE:  The patient was admitted to telemetry monitoring.  He  was treated with aggressive IV diuresis at 120 mg 3 times per day.  He  was placed on renal diet with fluid restriction.  Renal consultation was  obtained.  Vascular Surgery also assessed the patient's AV fistula and  found he was functioning well, although he did have a moderate amount of  ecchymosis at the operative site.  There was no erythema or fever.  The  first hospital day, the patient did have a low-grade fever up to 100.8,  but subsequently remained afebrile throughout his hospital course.  He  had a normal white count.  Blood cultures were obtained and were  negative x2.  An echocardiogram was obtained.  This demonstrated normal  left ventricular size and contractility with normal ejection fraction of  60%.  There was focal basal septal hypertrophy.  He had mild mitral and  tricuspid insufficiency.  There was evidence of moderate aortic stenosis  with a mean gradient of 16-18 mmHg and a valve area of 1.24 cm2, it was  therefore felt that the patient's problem was  acute volume overload due  to his chronic renal failure.  Discussions were had with Renal  concerning need to go on dialysis in the near future.  The patient would  like to try to continue on medications at this time and he was  subsequently transitioned to 160 mg of Lasix 3 times daily orally.  He  continued to effect a good diuresis.  His BUN and creatinine did  gradually rise and at the time of discharge his BUN was 81 with a  creatinine of 5.27, phosphorus was 4.8, calcium 8.4.  His PTH level was  elevated at 238.8.  BNP level had declined to 389.  His chest x-ray  showed resolution of his pulmonary edema and Kerley B lines.  He still  had some opacity in the left lower lobe, it was felt most likely to  represent atelectasis, concerns the patient was afebrile, had no cough,  and no elevated white count.  The patient's iron stores were  significantly decreased with iron level of 20, TIBC of 261, saturation  of 8%, therefore prior to discharge he was infused with INFeD over 4  hours.   Also given his elevated PTH, he was started on Hectorol 1 mcg  per day.  His other medications were essentially unchanged.  Despite  clearing of his pulmonary edema, he still was hypoxemic with oxygen  saturations of 86-88% on room air.  We therefore made arrangements for  him to have home oxygen therapy at 2 L nasal cannula continuous and this  could be further followed up as an outpatient.  The patient was  discharged home in stable condition on February 09, 2008, and he will  have close followup with Nephrology concerning dialysis.   DISCHARGE DIAGNOSIS:  1. Acute volume overload with pulmonary edema due to chronic kidney      disease.  2. Chronic renal failure secondary to nephrosclerosis.  3. Coronary artery disease with known severe stenosis in the distal      left anterior descending, treated medically.  4. Hypertension.  5. Dyslipidemia.  6. Venous insufficiency.  7. Gastroesophageal reflux disease.  8. Iron-deficiency anemia.  9. Sleep apnea.  10.Secondary hyperparathyroidism.   In addition, the patient did receive Epogen during his hospital stay to  be continued on every 2-week basis.   DISCHARGE MEDICATIONS:  1. Isosorbide mononitrate 60 mg per day.  2. Folic acid 1 mg daily.  3. Amlodipine 5 mg daily.  4. Aspirin 81 mg per day.  5. Metoprolol 50 mg b.i.d.  6. Omeprazole 20 mg b.i.d.  7. Furosemide 160 mg 3 times per day.  8. Gabapentin reduced to 300 mg daily.  9. Meclizine 25 mg twice daily.  10.Ranexa 500 mg per day.  11.Iron sulfate 325 mg 3 times per day.  12.Hectorol 1 mcg per day.  13.Vitamin D 1000 units daily.  14.Simvastatin 20 mg per day.  15.MiraLax 17 g daily.   The patient is instructed on a renal diet.  He will make arrangements  for home oxygen therapy at 2 L continuous.  He will have followup  scheduled with Dr. Hyman Hopes in 10 days and will see Dr. Swaziland back in 3  months.   DISCHARGE STATUS:  Improved.            ______________________________  Peter M. Swaziland, M.D.     PMJ/MEDQ  D:  02/09/2008  T:  02/09/2008  Job:  [id number]   cc:   Geoffry Paradise, M.D.  Lemmie Evens,  M.D.  Garnetta Buddy, M.D.  Jorge Ny, MD

## 2010-08-08 NOTE — H&P (Signed)
   NAME:  Gerald Tucker, Gerald Tucker                           ACCOUNT NO.:  [id number]   MEDICAL RECORD NO.:  [id number]                   PATIENT TYPE:  INP   LOCATION:  NA                                   FACILITY:  [facility]   PHYSICIAN:  Clarene Reamer, P.A.-C.             DATE OF BIRTH:  [birth year]   DATE OF ADMISSION:  DATE OF DISCHARGE:                                HISTORY & PHYSICAL   ADDENDUM:  Send a copy of the transcribed history and physical to Dr.  Geoffry Paradise.                                               Clarene Reamer, P.A.-C.    SW/MEDQ  D:  10/03/2002  T:  10/03/2002  Job:  [id number]   cc:   Geoffry Paradise, M.D.  [address]  [place]  [place] [address]  Fax: [phone]

## 2010-08-08 NOTE — H&P (Signed)
NAME:  Gerald Tucker, Gerald Tucker                           ACCOUNT NO.:  [id number]   MEDICAL RECORD NO.:  [id number]                   PATIENT TYPE:  INP   LOCATION:  NA                                   FACILITY:  [facility]   PHYSICIAN:  Marlowe Kays, M.D.               DATE OF BIRTH:  1918/06/02   DATE OF ADMISSION:  10/10/2002  DATE OF DISCHARGE:                                HISTORY & PHYSICAL   CHIEF COMPLAINT:  Pain in my lower back, left thigh, and left lower  extremity.   HISTORY OF PRESENT ILLNESS:  The patient is an 77 year old male with a long  history of low back pain and left lower extremity pain.  The patient stated  this his problems started around April for no obvious reason.  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  Standing also makes the pain worse.  He goes on to state  that nothing really reduces the pain aside from staying off his feet.  He  states he has a burning sensation across his lower back going down the left  posterior thigh and lower leg.  He states that his pain is a 10/10.  He  denies any previous back surgery.  He has undergone a selective nerve root  block at L5 by Dr. Sheran Luz, which provided little relief for him.  Dr.  Simonne Come feels that it is best at this point to proceed with a central  decompression laminectomy at L5-S1, and a microdiskectomy at L3-4.  The  risks and benefits of the surgery have been discussed with the patient, and  the patient wishes to proceed.   PAST MEDICAL HISTORY:  1. Osteoarthritis.  2. Hypercholesterolemia.  3. Rheumatoid arthritis.  4. Hypertension.   PAST SURGICAL HISTORY:  1. Cervical disk surgery.  2. Hernia surgery.  3. Varicose veins.  4. Appendectomy.  5. Tonsillectomy.  6. Cholecystectomy.   MEDICATIONS:  1. Prednisone 12.5 mg one p.o. daily.  2. Sulfizin-HC 500 mg p.o. b.i.d.  3. Folic acid 1 mg p.o. b.i.d.  4. Felodipine 2.5 mg one  p.o. daily.  5. Fosinopril-NA 40 mg one p.o. daily.  6. Metoprolol Tartrate 50 mg one p.o. b.i.d.  7. Aspirin 81 mg one p.o. daily, which he is instructed to stop.  8. Prilosec 20 mg one p.o. daily.  9. Furosemide 40 mg one p.o. daily.  10.      Iron 325 mg one p.o. daily.  11.      Tylenol Arthritis two pills 650 mg each at lunchtime.  12.      Neurontin 300 mg p.o. q.h.s.  13.      Tylenol Arthritis 650 mg p.o. q.h.s.  14.      Caltrate-600 plus D p.o. q.h.s.  15.  Terazosin HCL 2 mg p.o. q.h.s.   ALLERGIES:  PENICILLIN.   SOCIAL HISTORY:  The patient denies any tobacco or alcohol use.  He is a  widower.  He lives alone.  He lives in a [place] house with four steps  entering the house.  His sons will help take care of him after surgery.   FAMILY HISTORY:  Mother deceased with a cerebrovascular accident.  Father  deceased with Alzheimer's.   REVIEW OF SYSTEMS:  GENERAL:  Denies fevers, chills, night sweats, bleeding  tendencies.  CNS:  Positive headache, denies blurry or double vision,  seizures, paralysis.  RESPIRATORY:  Positive shortness of breath with  exertion.  Denies productive cough, hemoptysis.  CV:  Denies chest pain,  angina, or orthopnea.  GI:  Positive diarrhea.  Denies nausea, vomiting,  constipation, melena, or bloody stools.  GU:  Denies dysuria, hematuria, or  discharge.  MUSCULOSKELETAL:  As pertinent to HPI.   PHYSICAL EXAM:  VITALS:  Blood pressure 120/60, pulse 72, respirations 16.  GENERAL:  A well-developed, well-nourished, 77 year old male.  HEENT:  Normocephalic, atraumatic.  Pupils equally round and reactive to  light.  NECK:  Supple.  Discrete bilateral carotid bruits noted.  CHEST:  Clear to auscultation bilaterally.  No wheezes or crackles.  HEART:  Regular rate and rhythm, no rubs or gallops.  A 1/6 systolic murmur  heard best over the aortic valve.  ABDOMEN:  Soft, nontender, nondistended.  Positive bowel sounds x4.  EXTREMITIES:  Strength  is 5/5 in the bilateral lower extremities.  Reflexes  2+ at knees bilaterally, absent in ankles bilaterally.  Straight leg raise  is negative.  SKIN:  No rashes or lesions.   X-RAY:  CT reveals multi-level degenerative disk disease with foraminal disk  protrusion at L3-4 and central canal stenosis at L5-S1.   IMPRESSION:  1. Herniated nucleus pulposis at L3-4 on the left.  Spinal stenosis at L5-     S1.  2. Osteoarthritis.  3. Hypercholesterolemia.  4. Rheumatoid arthritis.  5. Hypertension.   PLAN:  Patient will be admitted to the [facility] on October 10, 2002, and undergo a central decompression laminectomy at L5-S1, with a  microdiskectomy at L3-4 on the left, by Dr. Marlowe Kays.      Clarene Reamer, P.A.-C.                   Marlowe Kays, M.D.    SW/MEDQ  D:  10/03/2002  T:  10/03/2002  Job:  [id number]

## 2010-08-08 NOTE — Discharge Summary (Signed)
NAME:  Gerald Tucker, Gerald Tucker                           ACCOUNT NO.:  [id number]   MEDICAL RECORD NO.:  [id number]                   PATIENT TYPE:  INP   LOCATION:  0466                                 FACILITY:  [facility]   PHYSICIAN:  Marlowe Kays, M.D.               DATE OF BIRTH:  Jan 16, 1919   DATE OF ADMISSION:  10/10/2002  DATE OF DISCHARGE:  10/13/2002                                 DISCHARGE SUMMARY   ADMISSION DIAGNOSES:  1. Herniated nucleus pulposis, L3-4 on the left, with spinal stenosis at L5-     S1.  2. Osteoarthritis.  3. Hypercholesterolemia.  4. Rheumatoid arthritis.  5. Hypertension.  6. Sleep apnea.   DISCHARGE DIAGNOSES:  1. Herniated nucleus pulposis, L3-4 on the left, with spinal stenosis at L5-     S1.  2. Osteoarthritis.  3. Hypercholesterolemia.  4. Rheumatoid arthritis.  5. Hypertension.  6. Sleep apnea.  7. Mild postoperative anemia.   OPERATION:  On October 10, 2002, the patient underwent central decompression,  lumbar laminectomy at L5-S1, L4-5, and L3-4, with exploration of L3-4 disk  on the left.  Dr. Ronnell Guadalajara assisted.   HISTORY OF PRESENT ILLNESS:  This patient with long-standing back problems  dating back to the '40s had developed severe left leg pain over the last  greater then not too distant past.  He now has extreme difficulty getting  around.  Selective nerve root blocks have provided little relief, and a  myelogram CT scan showed central stenosis at L5-S1 with non-filling of the  L5 nerve roots and a spondylolisthesis grade 1, L4 on 5.  Herniation of the  L3-4 disk on the left was confirmed by CT scan.  After much discussion, we  decided this patient would benefit from surgical intervention, was admitted  for the above procedure.   HOSPITAL COURSE:  The patient tolerated the surgical procedure quite well.  Drain which was placed intraoperatively in a separate puncture site from his  surgical wound was advanced on the first  postoperative day, and then  discontinued on the second postoperative day.  He had the expected moderate  amount of drainage into the dressing.  He had reduction of his leg pain on  the left.  Motor and neurovascular remained intact in both lower  extremities.  He had a minimal amount of drainage to the puncture wound and  into the dressing at discharge.  He was voiding, had a bowel movement, and  was eating well.   On the morning of anticipated discharge, the patient complained of a frontal  type headache which awoke him during the night.  He had no syncope, no  visual problems, no nausea or vomiting.  I spent a considerable amount of  time with him on the morning of discharge.  He was alert, awake, oriented  x3.  His son and family was with him.  The patient had  a similar headache  before.  I discussed this with Dr. Simonne Come who had seen the patient  earlier today, and there was no suspicion of a dural leak.  This goes along  with the fact that the patient had similar headaches before.  He described  his headache as throbbing.   PHYSICAL EXAMINATION:  GENERAL:  The patient showed no focal nerve deficit.  HEENT:  PERRLA.  EOM intact.  Oropharynx was clear.  The tongue was midline.  Facial musculature was equal.   The patient felt that he had not been continued on his home medications.  I  checked his record as well as the medications that he produced that he was  taking at home, and found that the only medication not taken was the  Fosinopril NA 40 mg one daily.  I took one pill from the bottle in his  medication kit, and gave him one.  I checked him later, and the majority of  his headache had eased.   After our conversation, the patient felt that he could go home.  He is to  follow up with Dr. Geoffry Paradise concerning any medical problems.   LABORATORY DATA:  Hematologically, showed a CBC with a differential on  admission which showed a normocytic anemia with hemoglobin of  9.2,  hematocrit 28.1.  White count was normal.  Blood chemistries were all  essentially normal.  He did have an elevated _________ after Dr. Evlyn Kanner had  seen him.  Urinalysis was negative for urinary tract infection.  Chest x-ray  showed anterior elevation of the right hemidiaphragm which may at least be  paralyzed.  Also, basilar atelectasis, post-surgical change, lower cervical  anterior spinal fusion, and mid-thoracic scoliosis.  Electrocardiogram and  other nuclear medicine studies preoperatively showed normal stress  Cardiolite perfusion study, normal left ventricular function, this was done  by Dr. Peter Swaziland last November.   CONDITION ON DISCHARGE:  Improved and stable.   PLAN:  The patient is discharged to his home.  He lives alone.   ACTIVITY:  I told him he could ambulate ad lib.  He is to avoid sitting for  long periods of time.   WOUND CARE:  He may shower when there is no drainage to his back.   FOLLOWUP:  1. He should return to see Dr. Simonne Come about two weeks after the date of     surgery.  2. Follow up with Dr. Geoffry Paradise concerning any medical problems he may     have, in particularly, if he would like to review his medications with     Dr. Jacky Kindle.  If his headache continues, then he definitely must see Dr.     Jacky Kindle.  3. Follow up with his dermatologist p.r.n.   I understand [employer] through his church is to provide him with meals  at home.  He has family that live in cities fairly close by.   If he has any problems or questions, call the office immediately.     Dooley L. Cherlynn June.                 Marlowe Kays, M.D.    DLU/MEDQ  D:  10/13/2002  T:  10/13/2002  Job:  [id number]   cc:   Geoffry Paradise, M.D.  [address]  [place]  [place] [address]  Fax: [phone]

## 2010-08-08 NOTE — H&P (Signed)
NAME:  Gerald Tucker, Gerald Tucker NO.:  [id number]   MEDICAL RECORD NO.:  [id number]            PATIENT TYPE:   LOCATION:                                 FACILITY:   PHYSICIAN:  Peter M. Swaziland, M.D.  DATE OF BIRTH:  Feb 13, 1919   DATE OF ADMISSION:  02/06/2005  DATE OF DISCHARGE:                                HISTORY & PHYSICAL   HISTORY OF PRESENT ILLNESS:  [name] is an 77 year old white male who is  seen for evaluation of chest pain.  Approximately one week ago, he  experienced right parasternal chest pain that began on Saturday night and  persisted throughout the day on Sunday.  He states it felt like his chest  was caving in.  He had symptoms of heaviness and tightness.  He denied any  sweating, nausea, vomiting, or diaphoresis, but did have mild shortness of  breath.  He seemed to get some relief when he would stretch and take a deep  breath.  Subsequent to that, he has had recurrent substernal chest pain and  also associated right shoulder pain.  The patient had been evaluated in the  past with Cardiolite study in November 2003, and again in January 2006.  The  study in 2003 was normal.  His subsequent stress images in 2006 looked  normal, but he did have a inferolateral defect noted on his resting images.  It was noted that his left ventricular function was decreased with ejection  fraction of 45%.  Because of his persistent chest pain and equivocal stress  Cardiolite findings in the past, it is recommended that he undergo cardiac  catheterization.  However, the patient does have a history of chronic renal  insufficiency.  His baseline creatinine on November 18, 2004, was 2.9, with a  BUN of 40.  His potassium was 5.9.  We recommended holding his benazepril  and Lasix.  Subsequently, his BUN came down to 29, and his creatinine to  2.5, his potassium was 5.2.  Given his significant renal insufficiency, it  was recommended he be admitted for overnight hydration,  institution of  Mucomyst therapy, and bicarbonate drip prior to attempt at cardiac  catheterization.   PAST MEDICAL HISTORY:  1.  Gastroesophageal reflux disease.  2.  Hypertension.  3.  Arthritis.  4.  Hypercholesterolemia.  5.  Previous cholecystectomy.  6.  Chronic renal insufficiency.  7.  Prior back surgery for a pinched nerve.   ALLERGIES:  PENICILLIN.   MEDICATIONS:  1.  Prednisone 1 mg daily.  2.  Colon cleanser daily.  3.  Furosemide 60 mg daily, is on hold.  4.  Metoprolol 50 mg b.i.d.  5.  Benazepril 40 mg daily, is on hold.  6.  Folic acid supplement daily.  7.  Omeprazole 20 mg daily.  8.  Aspirin daily.  9.  Tylenol p.r.n.   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  He is widowed and has four children.  He does have some difficulty  walking and uses a walker.  He denies tobacco or alcohol use.   FAMILY HISTORY:  Father died at age 28 of heart disease.  Mother died at age  96 with a stroke.  One brother died in his 42s with a stroke.  Two other  brothers have died of unknown causes.  One sister died of unknown causes.  Two sisters are alive and well.   REVIEW OF SYSTEMS:  As noted in HPI, otherwise negative.   PHYSICAL EXAMINATION:  GENERAL:  The patient is a pleasant white male in no  distress.  VITAL SIGNS:  Weight is 204 pounds, blood pressure is 140/70, pulse 60 and  regular.  HEENT:  Normocephalic, atraumatic.  Pupils equal, round, reactive to light  and accommodation.  Extraocular movements are full.  Oropharynx is clear.  NECK:  Without JVD, adenopathy, thyromegaly, or bruits.  LUNGS:  Clear to auscultation and percussion.  CARDIAC:  Regular rate and rhythm.  Normal S1 and S2, without gallop,  murmur, rub, or click.  ABDOMEN:  Soft and nontender.  He has no masses or bruits.  EXTREMITIES:  Good pedal pulses.  He has trace pedal edema.  SKIN:  Warm and dry.  NEUROLOGIC:  Nonfocal.   LABORATORY DATA:  Glucose  103, BUN 29, creatinine 2.5.  Sodium 143,  potassium 5.2, chloride 107, CO2 of 29.  Urinalysis shows specific gravity  of 1.02, pH of 6.5, he has 100 mg/dL of protein, he has 2+ SSA, there are  rare white cells and rare granular, and occasional hilan casts.  His coag's  are normal.  White count 7800, hemoglobin 11.9, hematocrit 37.2, and  platelets 221,000.   IMPRESSION:  1.  Recent onset of chest pain with some features concerning for angina.      The patient has not responded to anti-reflux measures.  Previous      Cardiolite study in January was equivocal.  2.  Hypertension.  3.  Hypercholesterolemia.  4.  History of arthritis.  5.  Chronic renal insufficiency.  6.  Mild anemia.   PLAN:  The patient will be admitted for overnight hydration.  We will also  begin on bicarbonate drip per pharmacy.  We will begin on Mucomyst 600 mg  b.i.d.  We will hold his Lasix and benazepril until after his cardiac  catheterization.  We will make every effort to limit the contrast load with  his cardiac catheterization.           ______________________________  Peter M. Swaziland, M.D.     PMJ/MEDQ  D:  02/03/2005  T:  02/03/2005  Job:  [id number]   cc:   Geoffry Paradise, M.D.  Fax: [phone]

## 2010-08-08 NOTE — Discharge Summary (Signed)
NAME:  Gerald Tucker, Gerald Tucker                 ACCOUNT NO.:  [id number]   MEDICAL RECORD NO.:  [id number]          PATIENT TYPE:  INP   LOCATION:  3740                         FACILITY:  [facility]   PHYSICIAN:  Peter M. Swaziland, M.D.  DATE OF BIRTH:  [birth year]   DATE OF ADMISSION:  02/05/2005  DATE OF DISCHARGE:  02/07/2005                                 DISCHARGE SUMMARY   HISTORY OF PRESENT ILLNESS:  Gerald Tucker is an 77 year old white male who is  seen for evaluation of chest pain. His symptoms have been significant and  recurrent. He had a normal stress Cardiolite study in November of 2003.  Subsequent stress test in January of 2006 showed a inferolateral defect on  resting images an ejection fraction 45%. Because of his persistent chest  pain equivocal Cardiolite study in the past, it is felt that he should  undergo cardiac catheterization. He does have a history of chronic renal  insufficiency with baseline creatinine 2.4, BUN of 40.  Due to these  considerations, it is recommended holding his benazepril and Lasix with  repeat BUN of 29 and creatinine of 2.5. He was admitted overnight for  hydration prior to his contrast study and institution of Mucomyst therapy.  For details his past medical history, social history, family history and  physical exam please see admission history and physical.   LABORATORY DATA:  Prior to admission his urinalysis was clear, specific  gravity 1.020, pH 6.5, 100 mg/dL protein, 2+ SSA and occasional rare  granular cast. Coags were normal. Glucose 103, BUN 29, creatinine 2.5.  Sodium 143, potassium 5.2, chloride 107, CO2 is 29.  Hemoglobin 11.9,  hematocrit 37.2, white count and platelets were normal.   HOSPITAL COURSE:  The patient's ACE inhibitor and diuretics were held. He  was placed on IV hydration. He was placed on oral Mucomyst 600 mg b.i.d..  With holding his ACE inhibitors, blood pressure did increase and he was  started on Norvasc for blood pressure  control. His creatinine declined to  2.3. He did undergo cardiac catheterization procedure on February 06, 2005.  This demonstrated no significant left main coronary stenosis. The LAD had a  focal 60 to 70% stenosis in the mid vessel. There was a 90% stenosis in the  distal LAD after a very tortuous segment. The first diagonal branch had 50-  60% segmental stenosis. The left circumflex was a large dominant vessel and  had only 20-30% stenosis in a marginal branch and the right coronary was  normal ejection fraction is estimated 65-70%.  In reviewing his study, it  was felt that he was of poor candidate for catheter-based intervention due  to his distal location of his significant stenosis and marked tortuosity.  It was recommended that he be managed medically. He was continued on  metoprolol and aspirin as noted, we added Norvasc and isosorbide. He did  develop a moderate right groin hematoma post care cardiac catheterization  that was reduced manually. Due to this, he was watched overnight and  continued on IV hydration. The following morning his BUN was down to  22 and  creatinine was 2.1. Potassium was 3.7. His groin was stable and was it was  felt that he was stable for discharge.   DISCHARGE DIAGNOSES:  1.  Angina pectoris with single vessel obstructive atherosclerotic coronary      artery disease.  2.  Chronic renal insufficiency.  3.  Hypertension.  4.  Hypercholesterolemia.  5.  Gastroesophageal reflux disease.  6.  Arthritis.   DISCHARGE MEDICATIONS:  1.  Prednisone 1 mg per day.  2.  Toprol XL 50 mg twice a day.  3.  Folic acid daily.  4.  Omeprazole 20 mg per day.  5.  Aspirin 81 mg per day.  6.  Norvasc 5 mg per day.  7.  Imdur 60 mg per day.  8.  Nitroglycerin p.r.n.   The patient is to avoid any heavy lifting for one week. He is to remain on  low sodium diet. He will follow up Dr. Swaziland in one week. He may need to  resume diuretic therapy if his edema returns.    DISCHARGE STATUS:  Improved.           ______________________________  Peter M. Swaziland, M.D.     PMJ/MEDQ  D:  03/26/2005  T:  03/26/2005  Job:  [id number]   cc:   Geoffry Paradise, M.D.  Fax: [phone]

## 2010-08-08 NOTE — Op Note (Signed)
NAME:  Gerald Tucker, Gerald Tucker                           ACCOUNT NO.:  [id number]   MEDICAL RECORD NO.:  [id number]                   PATIENT TYPE:  INP   LOCATION:  0005                                 FACILITY:  [facility]   PHYSICIAN:  Marlowe Kays, M.D.               DATE OF BIRTH:  27-Jun-1918   DATE OF PROCEDURE:  10/10/2002  DATE OF DISCHARGE:                                 OPERATIVE REPORT   PREOPERATIVE DIAGNOSES:  1. Spinal stenosis and dislocation herniation, L3-4, left.  2. Central spinal stenosis, L4-5 and L5-S1 with compression of L5 nerve     roots.   POSTOPERATIVE DIAGNOSES:  1. Spinal stenosis and dislocation herniation, L3-4, left.  2. Central spinal stenosis, L4-5 and L5-S1 with compression of L5 nerve     roots.   OPERATION:  Central decompression L5-S1, L4-5, and L3-4 left with  exploration of L3-4 disk, left.   SURGEON:  Illene Labrador. Aplington, M.D.   ASSISTANT:  Philips J. Montez Morita, M.D.   ANESTHESIA:  General.   PATHOLOGY AND JUSTIFICATION FOR PROCEDURE:  Gerald Tucker reports longstanding  back problems, but recently he has had severe left leg pain.  MRI has  demonstrated a foraminal disk herniation at L3-4 with spondylolisthesis of  L5 on S1.  Myelogram CT scan has demonstrated central stenosis at 5-S1 with  nonfilling of the L5 nerve roots.  He was noted to have a spondylolisthesis  grade 1 L5 on S1.  CT scan also confirmed the disk herniation at L3-4 left.   DESCRIPTION OF PROCEDURE:  Prophylactic antibiotics, satisfactory general  anesthesia, Foley catheter inserted, placed in the prone position on the  Wilson frame.  Back was prepped with DuraPrep and with three spinal needles  and lateral x-ray to attempt to localize the operative field.  Draping the  back in a sterile field, made a midline incision and identified tentatively  the sacrum, L5, L4, and L3 spinous processes.  I tagged the lower two  spinous process with Kocher clamps, took a second lateral x-ray  confirming  that these were indeed on L4 and on L5.  We then began dissecting soft  tissue off the sacrum and lamina of L5, L4, and L3 and placed two self-  retaining McCullough retractors.  A double-action rongeur removed the  spinous process of L5, L4, and the inferior portion of L3.  Then with a  combination of double-action rongeur and Kerrison rongeurs, we began the  decompression centrally initially.  It became clear that at L5 he had a pars  defect type of spondylolisthesis with loose fragments, and we removed the  loose pars fragment.  Also, at L3, we worked beneath the lamina of L3,  removing mainly bone on the left because his right leg was asymptomatic.  Then brought in the microscope and completed the decompression around the  perimeter, removing a good bit of bone and ligamentum flavum  at the L4-5 in  particular.  After we had thoroughly decompressed the L5 nerve roots and all  foramina were patent, we looked at the disk at L3-4 which we identified for  certain with a third x-ray.  I opened the disk with a 15 knife blade, but I  was only able to remove a small amount of disk material.  We did check the  foramen, and the L4 nerve root was widely patent.  The wound was then well-  irrigated with sterile saline.  Gelfoam was placed over the dura, and I used  a quarter-inch Penrose drain on top of the Gelfoam.  He had lost about 750  mL of blood during the case.  He started with a hemoglobin of 9.2 and  intraoperative hemoglobin at the end was 8.0, so I planned to give him three  units of packed cells.  Then  carefully closed the wound around the drain with interrupted #1 Vicryl in  the fascia, 2-0 Vicryl in the subcutaneous tissue, and staples in the skin.  Dry sterile dressing was applied, tolerated the procedure well, and was  taken to recovery in satisfactory condition with no known complication.                                               Marlowe Kays, M.D.     JA/MEDQ  D:  10/10/2002  T:  10/10/2002  Job:  [id number]

## 2010-08-08 NOTE — Discharge Summary (Signed)
NAME:  BRIYAN, KLEVEN                 ACCOUNT NO.:  [id number]   MEDICAL RECORD NO.:  [id number]          PATIENT TYPE:  INP   LOCATION:  3015                         FACILITY:  [facility]   PHYSICIAN:  Stefani Dama, M.D.  DATE OF BIRTH:  1918/04/28   DATE OF ADMISSION:  07/26/2006  DATE OF DISCHARGE:  08/05/2006                               DISCHARGE SUMMARY   ADMITTING DIAGNOSIS:  Spondylosis and stenosis L3-4, L4-5, L5-S1 with  scoliosis lumbar spine.   DISCHARGE DIAGNOSIS:  Spondylosis and stenosis with scoliosis L3-4, L4-  5, L5-S1 lumbar radiculopathy.   ADDITIONAL DIAGNOSES:  1. Acute blood loss anemia.  2. Thrombocytopenia.  3. Postoperative hypertension.  4. Acute postoperative renal failure.  5. Atrial fibrillation.  6. Postoperative subacute delirium.   CONDITION ON DISCHARGE:  Improving.   HOSPITAL COURSE:  Mr. Jaun Galluzzo is an 77 year old individual who has  had significant lumbar spondylitic stenosis.  He has been progressively  becoming incapacitated secondary to his condition.  He had had previous  surgery of laminectomy at L4 which did not help his condition, and after  careful consideration of his options and considerable efforts at  conservative management, he ultimately desired to undergo surgical  decompression and stabilization of the lumbar spine which was required  from L3 to the sacrum.  This procedure was undertaken on May 5.  Postoperatively, the patient was maintained in the step-down unit of  3300.  It was noted that he was fairly hypotensive during the  postoperative period.  He had substantial acute blood loss anemia, and  this was treated aggressively with blood transfusion.  He was given  additional fluids.  He was noted to have atrial fibrillation with a well-  controlled heart rate.  He was seen by the critical care service to help  manage what appeared to be acute tubular necrosis from acute renal  failure secondary possibly to his  hypotensive status.  This gradually  improved over time.  However, he did require a hospital stay of  approximately 10 days to the point where the patient was beginning to  become ambulatory but was not independent in his activities.  He was  seen by the rehabilitation service and it was felt that he was an  appropriate candidate for acute inpatient rehabilitation and he was  extremely well motivated.  He was transferred on the June 15 to the  acute rehab service with his renal failure having stabilized and  improved with his creatinine dipping below the level of 3.0.  His  hemoglobin at time of discharge had also stabilized with a hemoglobin of  12.7.  Creatinine at the time of discharge was 3.19.  His electrolytes  also had normalized.  It was noted during the acute postoperative phase  that he had become hyponatremic with a sodium 128.   CONDITION ON DISCHARGE:  Stable.  He will be seen in the office in  approximately one month's time after he has had a chance to recuperate  from the surgical intervention.      Stefani Dama, M.D.  Electronically Signed  HJE/MEDQ  D:  09/09/2006  T:  09/09/2006  Job:  [id number]

## 2010-08-08 NOTE — Cardiovascular Report (Signed)
NAME:  Gerald Tucker, Gerald Tucker                 ACCOUNT NO.:  [id number]   MEDICAL RECORD NO.:  [id number]          PATIENT TYPE:  INP   LOCATION:  3740                         FACILITY:  [facility]   PHYSICIAN:  Peter M. Swaziland, M.D.  DATE OF BIRTH:  [birth year]   DATE OF PROCEDURE:  02/06/2005  DATE OF DISCHARGE:                              CARDIAC CATHETERIZATION   INDICATIONS FOR PROCEDURE:  The patient is an 77 year old white male who  presents with symptoms of chest pain. Prior Cardiolite study was  nondiagnostic in March 27, 2004. He does have history of hypertension and  family history of heart disease.   PROCEDURES:  Left heart catheterization, coronary and left ventricular  angiography.   EQUIPMENT USED:  A 6-French 4 cm right and left Judkins catheter, 6-French  pigtail catheter, 6-French arterial sheath.   CONTRAST:  90 cc of Omnipaque.   MEDICATIONS:  Local anesthesia 1% Xylocaine, nitroglycerin spray  sublingually x1.   HEMODYNAMIC DATA:  Aortic pressure was 210/71 with mean of 120. Left  ventricular pressure was 208 with EDP of 17 mmHg.   ANGIOGRAPHIC DATA:  Left coronary artery arises and distributes in dominant  fashion. Left main coronary is normal.   The left anterior descending artery is large vessel which wraps around the  apex. The proximal and midvessel were approximately two-thirds of the vessel  without significant disease. In the mid to distal vessel, there was a focal  70% stenosis. This was followed by a very tortuous segment and in the distal  LAD there was a 90% stenosis. The first diagonal has a segmental 50-60%  stenosis proximally.   Left circumflex coronary was a very large dominant vessel. There was 20-30%  disease in the first marginal vessel. Otherwise there was no significant  disease.   The right coronary was a small nondominant vessel that appears normal.   The left ventricular angiography performed in the RAO view demonstrates  normal left  ventricular size and contractility with normal systolic  function. Ejection fraction was estimated 65-70%. There was no mitral  regurgitation prolapse.   FINAL INTERPRETATION:  1.  Single-vessel obstructive atherosclerotic coronary artery disease. The      patient does have a severe lesion in the distal left anterior descending      artery. Given its distal location and marked tortuosity of the vessel,      it is unlikely      that this could be successfully treated with catheter based therapy.      Given that this is really his only significant disease, I would      recommend medical therapy.  2.  Normal left ventricular function.           ______________________________  Peter M. Swaziland, M.D.     PMJ/MEDQ  D:  02/06/2005  T:  02/07/2005  Job:  [id number]   cc:   Geoffry Paradise, M.D.  Fax: [phone]   Bernette Redbird, M.D.  Fax: [phone]

## 2010-08-22 ENCOUNTER — Inpatient Hospital Stay (HOSPITAL_COMMUNITY)
Admission: EM | Admit: 2010-08-22 | Discharge: 2010-08-26 | DRG: 350 | Disposition: A | Discharge status: Home / Self Care | Payer: Medicare Other | Attending: General Surgery | Admitting: General Surgery

## 2010-08-22 ENCOUNTER — Emergency Department (HOSPITAL_COMMUNITY): Payer: Medicare Other

## 2010-08-22 ENCOUNTER — Other Ambulatory Visit (INDEPENDENT_AMBULATORY_CARE_PROVIDER_SITE_OTHER): Payer: Self-pay | Admitting: Surgery

## 2010-08-22 DIAGNOSIS — N186 End stage renal disease: Secondary | ICD-10-CM | POA: Diagnosis present

## 2010-08-22 DIAGNOSIS — R404 Transient alteration of awareness: Secondary | ICD-10-CM | POA: Diagnosis present

## 2010-08-22 DIAGNOSIS — I4891 Unspecified atrial fibrillation: Secondary | ICD-10-CM | POA: Diagnosis present

## 2010-08-22 DIAGNOSIS — K4030 Unilateral inguinal hernia, with obstruction, without gangrene, not specified as recurrent: Principal | ICD-10-CM | POA: Diagnosis present

## 2010-08-22 DIAGNOSIS — M1990 Unspecified osteoarthritis, unspecified site: Secondary | ICD-10-CM | POA: Diagnosis present

## 2010-08-22 DIAGNOSIS — D649 Anemia, unspecified: Secondary | ICD-10-CM | POA: Diagnosis present

## 2010-08-22 DIAGNOSIS — Z992 Dependence on renal dialysis: Secondary | ICD-10-CM

## 2010-08-22 DIAGNOSIS — I2510 Atherosclerotic heart disease of native coronary artery without angina pectoris: Secondary | ICD-10-CM | POA: Diagnosis present

## 2010-08-22 DIAGNOSIS — I120 Hypertensive chronic kidney disease with stage 5 chronic kidney disease or end stage renal disease: Secondary | ICD-10-CM | POA: Diagnosis present

## 2010-08-22 DIAGNOSIS — R5381 Other malaise: Secondary | ICD-10-CM | POA: Diagnosis present

## 2010-08-22 DIAGNOSIS — G4733 Obstructive sleep apnea (adult) (pediatric): Secondary | ICD-10-CM | POA: Diagnosis present

## 2010-08-22 DIAGNOSIS — F0390 Unspecified dementia without behavioral disturbance: Secondary | ICD-10-CM | POA: Diagnosis present

## 2010-08-22 DIAGNOSIS — N2581 Secondary hyperparathyroidism of renal origin: Secondary | ICD-10-CM | POA: Diagnosis present

## 2010-08-22 LAB — LACTIC ACID, PLASMA: Lactic Acid, Venous: 2.1 mmol/L (ref 0.5–2.2)

## 2010-08-22 LAB — CBC
MCH: 33.8 pg (ref 26.0–34.0)
MCHC: 33.3 g/dL (ref 30.0–36.0)
MCV: 101.3 fL — ABNORMAL HIGH (ref 78.0–100.0)
Platelets: 266 10*3/uL (ref 150–400)
RDW: 14.5 % (ref 11.5–15.5)
WBC: 13.8 10*3/uL — ABNORMAL HIGH (ref 4.0–10.5)

## 2010-08-22 LAB — COMPREHENSIVE METABOLIC PANEL
Alkaline Phosphatase: 160 U/L — ABNORMAL HIGH (ref 39–117)
BUN: 15 mg/dL (ref 6–23)
CO2: 33 mEq/L — ABNORMAL HIGH (ref 19–32)
Chloride: 95 mEq/L — ABNORMAL LOW (ref 96–112)
Creatinine, Ser: 2.94 mg/dL — ABNORMAL HIGH (ref 0.4–1.5)
GFR calc non Af Amer: 20 mL/min — ABNORMAL LOW (ref >60)
Glucose, Bld: 157 mg/dL — ABNORMAL HIGH (ref 70–99)
Potassium: 4.1 mEq/L (ref 3.5–5.1)
Total Bilirubin: 1.3 mg/dL — ABNORMAL HIGH (ref 0.3–1.2)

## 2010-08-22 LAB — POCT I-STAT, CHEM 8
Calcium, Ion: 1.1 mmol/L — ABNORMAL LOW (ref 1.12–1.32)
Chloride: 96 mEq/L (ref 96–112)
Creatinine, Ser: 3.2 mg/dL — ABNORMAL HIGH (ref 0.4–1.5)
Glucose, Bld: 158 mg/dL — ABNORMAL HIGH (ref 70–99)
HCT: 41 % (ref 39.0–52.0)

## 2010-08-22 LAB — DIFFERENTIAL
Eosinophils Absolute: 0 10*3/uL (ref 0.0–0.7)
Eosinophils Relative: 0 % (ref 0–5)
Lymphs Abs: 0.8 10*3/uL (ref 0.7–4.0)
Monocytes Absolute: 1.1 10*3/uL — ABNORMAL HIGH (ref 0.1–1.0)

## 2010-08-22 LAB — CK TOTAL AND CKMB (NOT AT ARMC)
CK, MB: 3.7 ng/mL (ref 0.3–4.0)
Relative Index: INVALID (ref 0.0–2.5)
Total CK: 45 U/L (ref 7–232)

## 2010-08-23 LAB — CBC
HCT: 36.5 % — ABNORMAL LOW (ref 39.0–52.0)
Hemoglobin: 12.4 g/dL — ABNORMAL LOW (ref 13.0–17.0)
MCH: 34.5 pg — ABNORMAL HIGH (ref 26.0–34.0)
MCHC: 34 g/dL (ref 30.0–36.0)
RBC: 3.59 MIL/uL — ABNORMAL LOW (ref 4.22–5.81)

## 2010-08-23 LAB — BASIC METABOLIC PANEL
Calcium: 9.4 mg/dL (ref 8.4–10.5)
GFR calc Af Amer: 21 mL/min — ABNORMAL LOW (ref >60)
GFR calc non Af Amer: 17 mL/min — ABNORMAL LOW (ref >60)
Potassium: 4.2 mEq/L (ref 3.5–5.1)
Sodium: 138 mEq/L (ref 135–145)

## 2010-08-23 LAB — GLUCOSE, CAPILLARY
Glucose-Capillary: 92 mg/dL (ref 70–99)
Glucose-Capillary: 86 mg/dL (ref 70–99)

## 2010-08-24 NOTE — H&P (Signed)
  NAME:  Gerald Tucker, Gerald Tucker                 ACCOUNT NO.:  [id number]  MEDICAL RECORD NO.:  [id number]           PATIENT TYPE:  I  LOCATION:  2109                         FACILITY:  [facility]  PHYSICIAN:  Wilmon Arms. Corliss Skains, M.D. DATE OF BIRTH:  [birth year]  DATE OF ADMISSION:  08/22/2010 DATE OF DISCHARGE:                             HISTORY & PHYSICAL   CHIEF COMPLAINT:  Right groin swelling and intense pain.  HISTORY OF PRESENT ILLNESS:  This is a 77 year old male who has had a known right inguinal hernia for some time.  Due to his rather extensive past medical history, the decision was made not to repair it.  The patient had dialysis this morning and since that time has been complaining of severe pain and swelling in his right groin.  He has not had anything to eat since breakfast.  He did have a bowel movement this morning.  When he is lying still, he is in no distress.  PAST MEDICAL HISTORY: 1. End-stage renal disease, hemodialysis dependent. 2. Hypertension. 3. Coronary artery disease, managed by Dr. Peter Swaziland. 4. Secondary hyperparathyroidism. 5. Gastroesophageal reflux disease. 6. Osteoarthritis. 7. Anemia 8. Venous insufficiency. 9. Sleep apnea.  PAST SURGICAL HISTORY: 1. Back surgery. 2. Neck surgery. 3. Appendectomy. 4. Cataract surgery. 5. Bilateral inguinal hernia repairs. 6. Open cholecystectomy. 7. Left upper extremity AV fistula.  FAMILY HISTORY:  Unremarkable.  SOCIAL HISTORY:  Nonsmoker, nondrinker.  MEDICATIONS:  Metoprolol, Prilosec, simvastatin, Imdur, baby aspirin, MiraLax.  ALLERGIES:  PENICILLIN, ULTRAM, and MORPHINE.  PHYSICAL EXAMINATION:  VITAL SIGNS:  Temperature 96.9, heart rate 116, blood pressure is 151/84, respirations 14, sats 96% on room air. GENERAL:  Elderly male in no apparent distress. HEENT:  EOMI.  Sclerae anicteric. NECK:  No mass or thyromegaly. LUNGS:  Clear.  Normal respiratory effort. HEART:  Regular rhythm.  No  murmur. ABDOMEN:  Positive bowel sounds.  Soft, nontender, large bulge in the right groin.  He has healed bilateral incisions.  With the patient in Trendelenburg, I am not able to manually reduce this incarcerated hernia.  This area is fairly tender.  LABORATORY DATA:  Troponin is negative.  Lipase is 45.  Sodium 140, potassium 4.1, chloride 95, bicarb 33, BUN 15, creatinine 2.94, lactate 2.1, INR 1.1.  White count 13.8, hemoglobin 12.8, platelet count 266.  IMPRESSION:  Incarcerated right inguinal hernia.  PLAN:  We will proceed immediately to the operating room for repair.  We will consult Renal and Cardiology for medical management after surgery.     Wilmon Arms. Corliss Skains, M.D.     MKT/MEDQ  D:  08/22/2010  T:  08/23/2010  Job:  [id number]  Electronically Signed by Manus Rudd M.D. on 08/24/2010 09:31:32 PM

## 2010-08-24 NOTE — Op Note (Signed)
NAME:  Gerald Tucker, Gerald Tucker                 ACCOUNT NO.:  [id number]  MEDICAL RECORD NO.:  [id number]           PATIENT TYPE:  I  LOCATION:  2109                         FACILITY:  [facility]  PHYSICIAN:  Wilmon Arms. Corliss Skains, M.D. DATE OF BIRTH:  1919/03/12  DATE OF PROCEDURE:  08/22/2010 DATE OF DISCHARGE:                              OPERATIVE REPORT   PREOPERATIVE DIAGNOSIS:  Incarcerated right inguinal hernia.  POSTOPERATIVE DIAGNOSIS:  Incarcerated right inguinal hernia.  PROCEDURE:  Open right inguinal hernia repair.  SURGEON:  Wilmon Arms. Corliss Skains, MD  ANESTHESIA:  General endotracheal.  INDICATIONS:  This is a 77 year old male with a host of medical problems who has had a known hernia for sometime.  Due to his medical problems and his age, the decision was made not to proceed with repair as he was asymptomatic.  He presents today after dialysis with severe pain and swelling in his right groin.  I attempted to manually reduce the hernia in the emergency apartment was unsuccessful.  He presents now for emergent hernia repair.  DESCRIPTION OF PROCEDURE:  The patient was brought to the operating room and placed in supine position on operating room table.  After an adequate level of general anesthesia was obtained, the patient's right groin was shaved.  His abdomen and groin were prepped with Betadine and draped in sterile fashion.  A time-out was taken to ensure the proper patient, proper procedure.  We opened his previous hernia incision. Dissection was carried down to the subcutaneous tissues with cautery. We dissected carefully over the mass.  We dissected laterally until we could identify what appeared to be external oblique fascia.  There is a very large tennis ball size hernia bulge protruding.  We carefully opened the hernia sac and encountered some omentum.  This appeared to be viable.  We carefully dissected through the omentum opening the hernia sac completely.  We are finally  able to identify a loop of small bowel which had become incarcerated.  We dissected all the way back to the hernia defect at the internal ring.  The small bowel appeared ischemic but was not frankly necrotic.  We stretched opened the hernia defect slightly and observed the small bowel.  This seemed to pink up immediately.  I was able to manually reduce this without difficulty. However, due to incarceration of the small bowel, I chose not to place a mesh for his repair.  We cleared the muscle around the internal ring.  I then tightened the internal ring with multiple figure-of-eight 0 Novafil sutures.  We then closed what remained of the external oblique fascia with 0 Vicryl.  We infiltrated thoroughly with 0.25% Marcaine with epinephrine.  We closed the subcutaneous tissues with 3-0 Vicryl and the skin with 4-0 Monocryl.  Dermabond was used to seal the skin.  The patient was extubated and brought to recovery in stable condition.  All sponge, instrument, and needle counts were correct.     Wilmon Arms. Corliss Skains, M.D.     MKT/MEDQ  D:  08/22/2010  T:  08/23/2010  Job:  [id number]  Electronically Signed by Marolyn Haller.D.  on 08/24/2010 09:31:33 PM

## 2010-08-25 ENCOUNTER — Inpatient Hospital Stay (HOSPITAL_COMMUNITY): Payer: Medicare Other

## 2010-08-25 LAB — BASIC METABOLIC PANEL
BUN: 48 mg/dL — ABNORMAL HIGH (ref 6–23)
BUN: 52 mg/dL — ABNORMAL HIGH (ref 6–23)
Calcium: 8.9 mg/dL (ref 8.4–10.5)
Calcium: 8.9 mg/dL (ref 8.4–10.5)
Creatinine, Ser: 6.46 mg/dL — ABNORMAL HIGH (ref 0.4–1.5)
GFR calc Af Amer: 10 mL/min — ABNORMAL LOW (ref >60)
GFR calc non Af Amer: 8 mL/min — ABNORMAL LOW (ref >60)
Glucose, Bld: 127 mg/dL — ABNORMAL HIGH (ref 70–99)
Potassium: 5 mEq/L (ref 3.5–5.1)
Sodium: 129 mEq/L — ABNORMAL LOW (ref 135–145)

## 2010-08-25 LAB — CBC
MCHC: 33.3 g/dL (ref 30.0–36.0)
Platelets: 6 10*3/uL — CL (ref 150–400)
RDW: 14.3 % (ref 11.5–15.5)
WBC: 10 10*3/uL (ref 4.0–10.5)

## 2010-08-25 LAB — PHOSPHORUS
Phosphorus: 6.2 mg/dL — ABNORMAL HIGH (ref 2.3–4.6)
Phosphorus: 6.1 mg/dL — ABNORMAL HIGH (ref 2.3–4.6)

## 2010-08-26 LAB — CBC
Hemoglobin: 10.6 g/dL — ABNORMAL LOW (ref 13.0–17.0)
MCH: 34.1 pg — ABNORMAL HIGH (ref 26.0–34.0)
MCHC: 34 g/dL (ref 30.0–36.0)
Platelets: 23 10*3/uL — CL (ref 150–400)

## 2010-08-29 LAB — HEPARIN INDUCED THROMBOCYTOPENIA PNL
Heparin Induced Plt Ab: NEGATIVE
Patient O.D.: 0.314
UFH Low Dose 0.5 IU/mL: 0 % Release

## 2010-09-10 ENCOUNTER — Encounter (INDEPENDENT_AMBULATORY_CARE_PROVIDER_SITE_OTHER): Payer: Self-pay | Admitting: Surgery

## 2010-09-10 NOTE — Discharge Summary (Signed)
Gerald Tucker, Gerald Tucker                 ACCOUNT NO.:  [id number]  MEDICAL RECORD NO.:  [id number]  LOCATION:  6705                         FACILITY:  [facility]  PHYSICIAN:  Wilmon Arms. Corliss Skains, M.D. DATE OF BIRTH:  May 23, 1918  DATE OF ADMISSION:  08/22/2010 DATE OF DISCHARGE:  08/26/2010                              DISCHARGE SUMMARY   ADMISSION DIAGNOSES: 1. Incarcerated right inguinal hernia. 2. End-stage renal disease, on dialysis. 3. Coronary artery disease, history of congestive heart failure,     followed by Dr. [place]. 4. Peripheral vascular occlusive disease. 5. Hypertension. 6. 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 7. Osteoarthritis. 8. Gastroesophageal reflux disease. 9. Hyperparathyroidism. 10.Sleep apnea. 11.History of venous insufficiency. 12.Anemia.  DISCHARGE DIAGNOSIS: 1. Incarcerated right inguinal hernia. 2. End-stage renal disease, on dialysis. 3. Coronary artery disease, history of congestive heart failure,     followed by Dr. [place]. 4. Peripheral vascular occlusive disease. 5. Hypertension. 6. 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 7. Osteoarthritis. 8. Gastroesophageal reflux disease. 9. Hyperparathyroidism. 10.Sleep apnea. 11.History of venous insufficiency. 12.Anemia.  PROCEDURE:  Open right inguinal hernia repair August 22, 2010, Dr. Corliss Skains.  BRIEF HISTORY:  The patient is a 77 year old gentleman who presents with right groin swelling and intensive pain.  He had a known right inguinal hernia for some time but due to his extensive medical history, it was decided not to repair electively.  The patient had dialysis the morning of admission and since that time have been complaining of severe pain in his right groin.  He has not had anything to eat since breakfast.  He had a bowel movement the morning prior.  Exam by Dr. Corliss Skains confirmed and subsequently admitted, taken to the OR electively.  PAST MEDICAL  HISTORY:  As listed above.  SURGERIES:  Back surgery, neck surgery, appendectomy, cataracts, bilateral inguinal hernia repairs in the past, open cholecystectomy, left upper arm AV fistula.  MEDICATIONS:  Metoprolol, Prilosec, simvastatin, Imdur, baby aspirin and MiraLax.  ALLERGIES:  PENICILLIN, ULTRAM and MORPHINE.  For further history and physical, please see the dictated note.  HOSPITAL COURSE:  The patient was admitted, taken to the OR and returned to the floor in satisfactory condition.  He was seen by the renal service and followed throughout his hospital course and they managed his dialysis.  He had no medical problems postop but he was extremely confused.  PT and OT has seen the patient and he is progressing nicely. He is essentially at his baseline.  His son is here with him this morning and feels his dad is at baseline, his confusion usually improves after he goes home.  Most medications make his confusion worse.  He has been taken off all narcotics and is back on Tylenol only for pain. Mobilized, underwent dialysis yesterday.  He is scheduled for dialysis again tomorrow.  His incision looks good.  There is a fair amount of ecchymosis along the skin edges but it is intact, nontender.  He had a large bowel movement this morning and is ready for discharge pending clearance by the renal service.  DISCHARGE MEDICATIONS: 1. Tylenol.  He was taking 1 a day p.r.n.  I  am going to up it to 1-2     q.6 p.r.n. for pain. 2. He will continue aspirin 81 mg daily. 3. Daily vitamins 1 daily. 4. Imdur 1 tablet 60 mg daily. 5. MiraLax 17 g daily. 6. Lopressor 50 mg b.i.d. 7. Sublingual nitroglycerin p.r.n. 8. Prilosec 20 mg b.i.d. 9. Simvastatin 20 mg nightly.  He will follow up with Dr. Corliss Skains on Friday June 22 at 9:50 a.m.  He has dialysis Monday, Wednesday and Friday at 11:00 a.m.     Eber Hong, P.A.   ______________________________ Wilmon Arms. Mckyla Deckman,  M.D.    WDJ/MEDQ  D:  08/26/2010  T:  08/26/2010  Job:  [id number]  cc:   Dr. McManus Peter M. [place], M.D.  Electronically Signed by Sherrie George P.A. on 09/04/2010 04:13:25 PM Electronically Signed by Manus Rudd M.D. on 09/10/2010 09:06:08 AM

## 2010-09-25 IMAGING — CR DG CHEST 2V
2 series · 2 of 2 positions shown · non-contrast
Comparison: Portable chest 07/31/2006 and PA and lateral chest
07/21/2006.

CLINICAL DATA: End-stage renal disease.  Preoperative film.

CHEST - 2 VIEW

[view not recorded (1 of 2)]
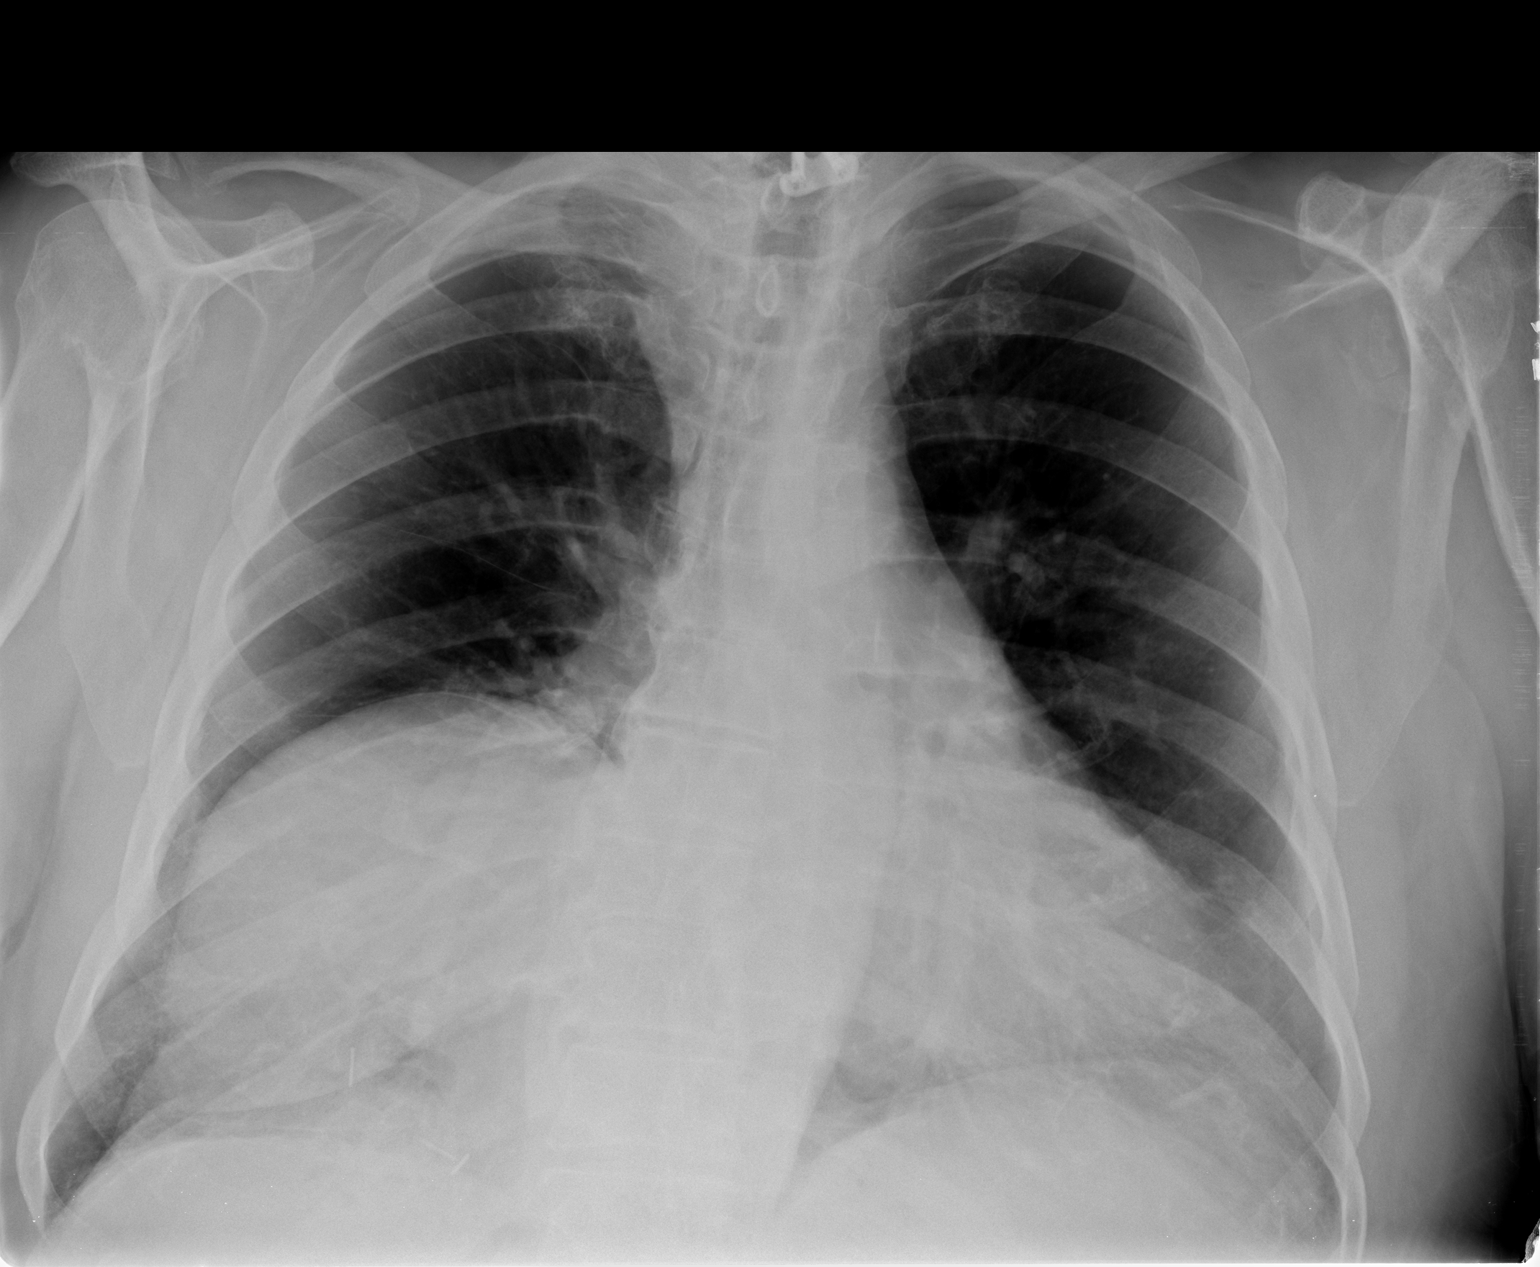

[view not recorded (2 of 2)]
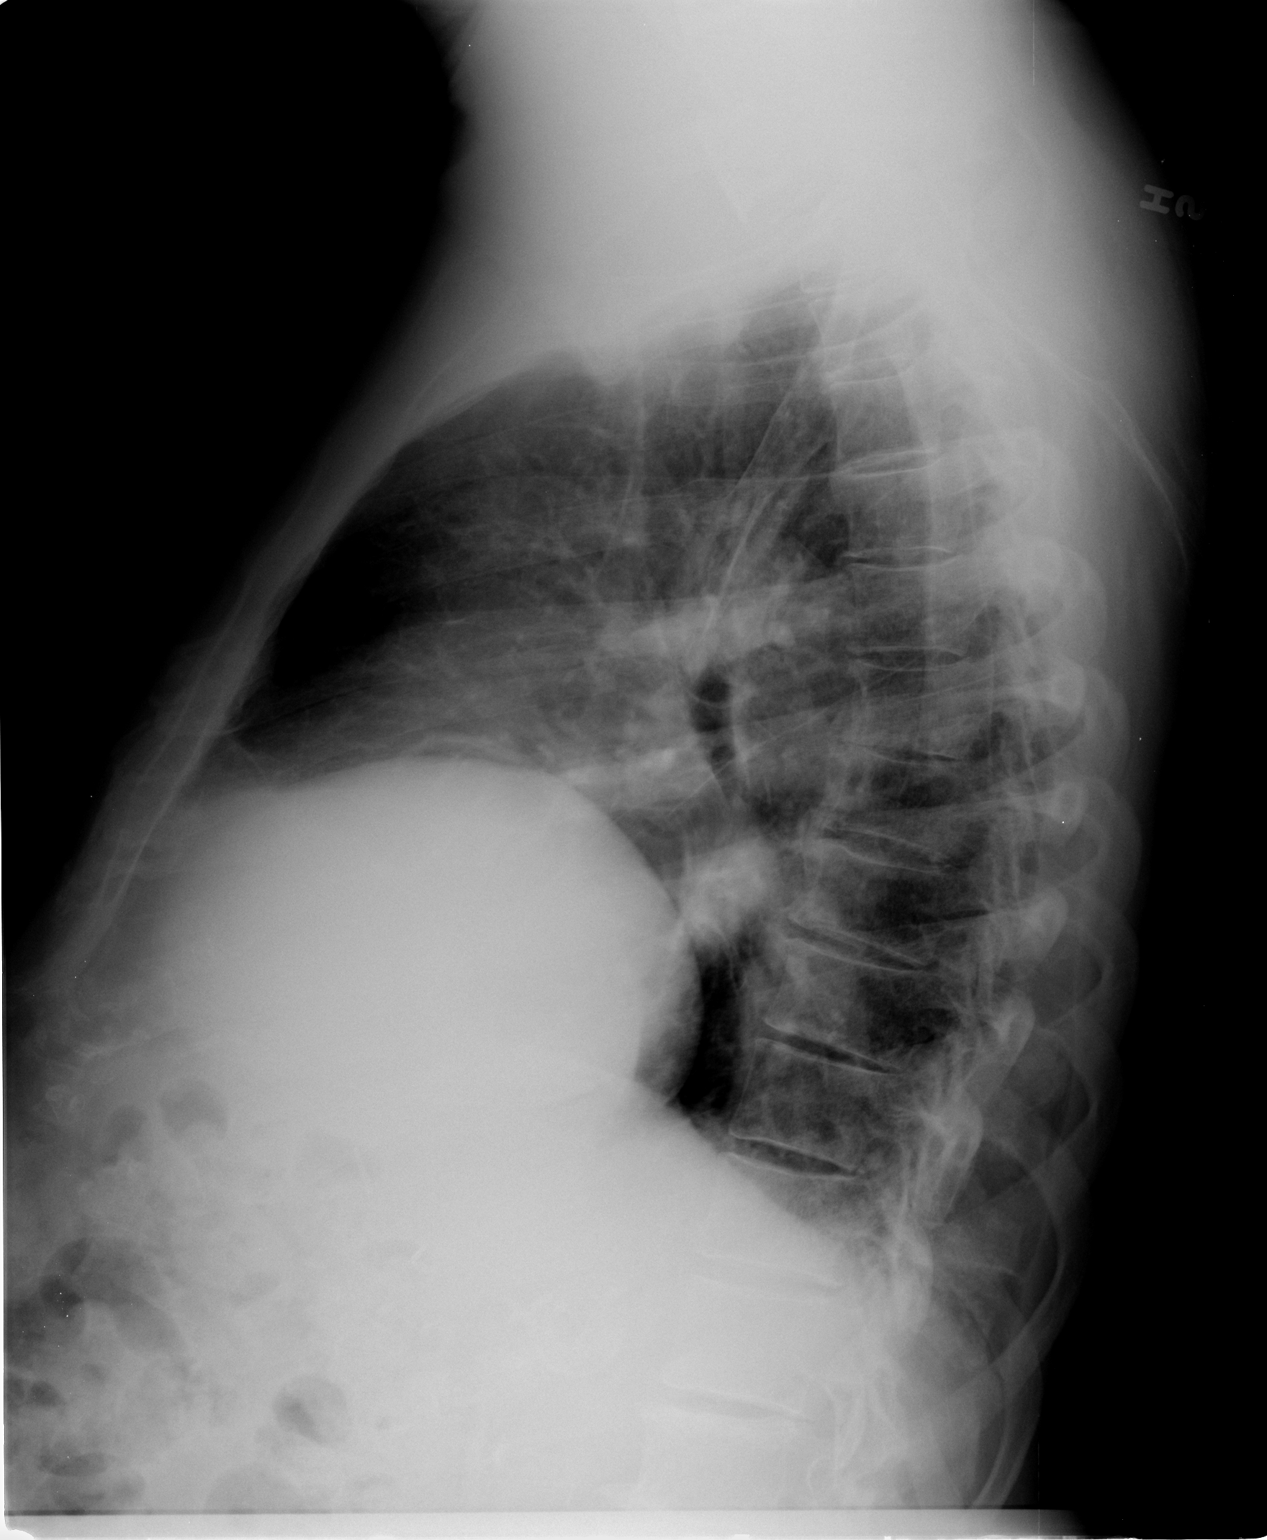

[2 of 2 positions shown; findings below may reference images not displayed]

FINDINGS: Head of the again seen is eventration of the right
hemidiaphragm.  Lungs are clear.  Heart size normal.  No pleural
effusion.
IMPRESSION: No acute finding.  Stable compared to prior exam.

## 2010-12-23 LAB — RENAL FUNCTION PANEL
Albumin: 3.4 — ABNORMAL LOW
BUN: 81 — ABNORMAL HIGH
CO2: 30
Chloride: 103
Chloride: 93 — ABNORMAL LOW
Creatinine, Ser: 5.27 — ABNORMAL HIGH
GFR calc Af Amer: 14 — ABNORMAL LOW
GFR calc non Af Amer: 11 — ABNORMAL LOW
Glucose, Bld: 105 — ABNORMAL HIGH
Phosphorus: 4.8 — ABNORMAL HIGH
Sodium: 142

## 2010-12-23 LAB — POCT I-STAT 4, (NA,K, GLUC, HGB,HCT)
Glucose, Bld: 107 — ABNORMAL HIGH
Glucose, Bld: 92
HCT: 40
Hemoglobin: 13.6
Hemoglobin: 13.9
Potassium: 4.1

## 2010-12-23 LAB — BASIC METABOLIC PANEL
Calcium: 8.4
Creatinine, Ser: 5.11 — ABNORMAL HIGH
GFR calc Af Amer: 13 — ABNORMAL LOW

## 2010-12-23 LAB — DIFFERENTIAL
Basophils Absolute: 0
Eosinophils Relative: 0
Lymphocytes Relative: 8 — ABNORMAL LOW
Lymphs Abs: 0.9
Monocytes Absolute: 0.9
Neutro Abs: 9.4 — ABNORMAL HIGH

## 2010-12-23 LAB — CARDIAC PANEL(CRET KIN+CKTOT+MB+TROPI)
CK, MB: 3.6
Total CK: 166
Total CK: 182
Troponin I: 0.02

## 2010-12-23 LAB — CBC
HCT: 29.1 — ABNORMAL LOW
HCT: 31.7 — ABNORMAL LOW
Hemoglobin: 9.8 — ABNORMAL LOW
MCHC: 33.2
MCV: 91.7
Platelets: 154
RBC: 3.19 — ABNORMAL LOW
RDW: 13.5
WBC: 11.3 — ABNORMAL HIGH
WBC: 9.9

## 2010-12-23 LAB — COMPREHENSIVE METABOLIC PANEL
AST: 34
Albumin: 3.5
BUN: 59 — ABNORMAL HIGH
Calcium: 8.8
Chloride: 102
Creatinine, Ser: 4.76 — ABNORMAL HIGH
GFR calc Af Amer: 14 — ABNORMAL LOW
Total Bilirubin: 1.5 — ABNORMAL HIGH
Total Protein: 7.1

## 2010-12-23 LAB — CULTURE, BLOOD (ROUTINE X 2)

## 2010-12-23 LAB — B-NATRIURETIC PEPTIDE (CONVERTED LAB)
Pro B Natriuretic peptide (BNP): 389 — ABNORMAL HIGH
Pro B Natriuretic peptide (BNP): 524 — ABNORMAL HIGH
Pro B Natriuretic peptide (BNP): 753 — ABNORMAL HIGH

## 2010-12-23 LAB — PTH, INTACT AND CALCIUM: Calcium, Total (PTH): 8.4

## 2010-12-23 LAB — IRON AND TIBC
Saturation Ratios: 8 — ABNORMAL LOW
UIBC: 241

## 2010-12-23 LAB — PHOSPHORUS: Phosphorus: 3.7

## 2010-12-25 LAB — CBC
HCT: 42.3 % (ref 39.0–52.0)
Hemoglobin: 11.6 g/dL — ABNORMAL LOW (ref 13.0–17.0)
Hemoglobin: 13.8 g/dL (ref 13.0–17.0)
MCHC: 32.5 g/dL (ref 30.0–36.0)
MCHC: 32.7 g/dL (ref 30.0–36.0)
MCV: 94.9 fL (ref 78.0–100.0)
MCV: 94.9 fL (ref 78.0–100.0)
MCV: 95 fL (ref 78.0–100.0)
RBC: 3.74 MIL/uL — ABNORMAL LOW (ref 4.22–5.81)
RBC: 3.81 MIL/uL — ABNORMAL LOW (ref 4.22–5.81)
RDW: 15.9 % — ABNORMAL HIGH (ref 11.5–15.5)
WBC: 8.3 10*3/uL (ref 4.0–10.5)
WBC: 9 10*3/uL (ref 4.0–10.5)

## 2010-12-25 LAB — COMPREHENSIVE METABOLIC PANEL
ALT: 20 U/L (ref 0–53)
AST: 24 U/L (ref 0–37)
AST: 32 U/L (ref 0–37)
Albumin: 3.2 g/dL — ABNORMAL LOW (ref 3.5–5.2)
Albumin: 3.6 g/dL (ref 3.5–5.2)
Alkaline Phosphatase: 74 U/L (ref 39–117)
Alkaline Phosphatase: 79 U/L (ref 39–117)
CO2: 26 mEq/L (ref 19–32)
CO2: 28 mEq/L (ref 19–32)
Chloride: 100 mEq/L (ref 96–112)
Chloride: 95 mEq/L — ABNORMAL LOW (ref 96–112)
Creatinine, Ser: 6.56 mg/dL — ABNORMAL HIGH (ref 0.4–1.5)
Creatinine, Ser: 7.39 mg/dL — ABNORMAL HIGH (ref 0.4–1.5)
GFR calc Af Amer: 10 mL/min — ABNORMAL LOW (ref >60)
GFR calc Af Amer: 9 mL/min — ABNORMAL LOW (ref >60)
GFR calc non Af Amer: 7 mL/min — ABNORMAL LOW (ref >60)
GFR calc non Af Amer: 8 mL/min — ABNORMAL LOW (ref >60)
Potassium: 4.3 mEq/L (ref 3.5–5.1)
Potassium: 4.9 mEq/L (ref 3.5–5.1)
Sodium: 136 mEq/L (ref 135–145)
Total Bilirubin: 0.9 mg/dL (ref 0.3–1.2)
Total Bilirubin: 0.9 mg/dL (ref 0.3–1.2)

## 2010-12-25 LAB — URINALYSIS, ROUTINE W REFLEX MICROSCOPIC
Ketones, ur: 15 mg/dL — AB
Nitrite: POSITIVE — AB
Protein, ur: 100 mg/dL — AB

## 2010-12-25 LAB — DIFFERENTIAL
Basophils Absolute: 0 10*3/uL (ref 0.0–0.1)
Basophils Relative: 1 % (ref 0–1)
Eosinophils Absolute: 0.1 10*3/uL (ref 0.0–0.7)
Eosinophils Relative: 1 % (ref 0–5)
Monocytes Absolute: 1.1 10*3/uL — ABNORMAL HIGH (ref 0.1–1.0)

## 2010-12-25 LAB — RENAL FUNCTION PANEL
Albumin: 3.5 g/dL (ref 3.5–5.2)
BUN: 49 mg/dL — ABNORMAL HIGH (ref 6–23)
Calcium: 8.8 mg/dL (ref 8.4–10.5)
GFR calc non Af Amer: 6 mL/min — ABNORMAL LOW (ref >60)
Glucose, Bld: 119 mg/dL — ABNORMAL HIGH (ref 70–99)
Potassium: 4.1 mEq/L (ref 3.5–5.1)

## 2010-12-25 LAB — CK: Total CK: 212 U/L (ref 7–232)

## 2010-12-25 LAB — URINE MICROSCOPIC-ADD ON

## 2010-12-25 LAB — GLUCOSE, CAPILLARY: Glucose-Capillary: 114 mg/dL — ABNORMAL HIGH (ref 70–99)

## 2010-12-25 LAB — URINE CULTURE

## 2010-12-25 LAB — AMMONIA: Ammonia: 23 umol/L (ref 11–35)

## 2010-12-25 LAB — CK TOTAL AND CKMB (NOT AT ARMC)
CK, MB: 5.6 ng/mL — ABNORMAL HIGH (ref 0.3–4.0)
Relative Index: 3 — ABNORMAL HIGH (ref 0.0–2.5)

## 2010-12-25 LAB — CULTURE, BLOOD (ROUTINE X 2)

## 2011-01-07 ENCOUNTER — Encounter: Payer: Self-pay | Admitting: Cardiology

## 2011-01-13 ENCOUNTER — Ambulatory Visit (INDEPENDENT_AMBULATORY_CARE_PROVIDER_SITE_OTHER): Payer: Medicare Other | Admitting: Cardiology

## 2011-01-13 ENCOUNTER — Encounter: Payer: Self-pay | Admitting: Cardiology

## 2011-01-13 VITALS — BP 118/65 | HR 75 | Wt 158.4 lb

## 2011-01-13 DIAGNOSIS — Z992 Dependence on renal dialysis: Secondary | ICD-10-CM

## 2011-01-13 DIAGNOSIS — I10 Essential (primary) hypertension: Secondary | ICD-10-CM

## 2011-01-13 DIAGNOSIS — E7800 Pure hypercholesterolemia, unspecified: Secondary | ICD-10-CM

## 2011-01-13 DIAGNOSIS — I2510 Atherosclerotic heart disease of native coronary artery without angina pectoris: Secondary | ICD-10-CM

## 2011-01-13 DIAGNOSIS — N186 End stage renal disease: Secondary | ICD-10-CM

## 2011-01-13 DIAGNOSIS — I4891 Unspecified atrial fibrillation: Secondary | ICD-10-CM

## 2011-01-13 NOTE — Assessment & Plan Note (Signed)
He has known occlusion of the distal LAD. He is asymptomatic on medical therapy.

## 2011-01-13 NOTE — Assessment & Plan Note (Signed)
Blood pressure control is excellent. We will continue with his current therapy. 

## 2011-01-13 NOTE — Patient Instructions (Signed)
Continue on your current medications.  I will see you again in 6 months.   

## 2011-01-13 NOTE — Progress Notes (Signed)
[name] Date of Birth: 05-02-1918 Medical Record #[id number]  History of Present Illness: Mr. Hinshaw is seen for followup today. Since his visit in March he underwent a right inguinal hernia repair. He reports this went well. He is tolerating his dialysis well. He denies any significant chest pain or shortness of breath. His weight has decreased by 6 pounds. He reports of the ulceration on his feet has healed completely. He is generally weak. He is seen in a wheelchair today.  Current Outpatient Prescriptions on File Prior to Visit  Medication Sig Dispense Refill  . acetaminophen (TYLENOL) 325 MG tablet Take 650 mg by mouth every 6 (six) hours as needed.        Marland Kitchen aspirin 81 MG tablet Take 81 mg by mouth daily.        . isosorbide mononitrate (IMDUR) 60 MG 24 hr tablet Take 60 mg by mouth daily.        . metoprolol (LOPRESSOR) 50 MG tablet Take 50 mg by mouth 2 (two) times daily.        . Multiple Vitamin (MULTIVITAMIN) tablet Take 1 tablet by mouth daily.        . nitroGLYCERIN (NITROSTAT) 0.4 MG SL tablet Place 0.4 mg under the tongue every 5 (five) minutes as needed.        Marland Kitchen omeprazole (PRILOSEC) 20 MG capsule Take 20 mg by mouth 2 (two) times daily.        . polyethylene glycol (MIRALAX / GLYCOLAX) packet Take 17 g by mouth daily.        . simvastatin (ZOCOR) 20 MG tablet Take 20 mg by mouth at bedtime.          Allergies  Allergen Reactions  . Morphine And Related   . Penicillins   . Ultram (Tramadol Hcl)     Past Medical History  Diagnosis Date  . Hypertension   . Ulcer   . GERD (gastroesophageal reflux disease)   . Heart disease   . Poor circulation   . ESRD on dialysis   . Hyperlipidemia   . Cancer   . Knee pain, right   . Coronary artery disease     WITH SEVERE STENOSIS IN THE DISTAL LAD  . OSA (obstructive sleep apnea)   . PAD (peripheral artery disease)   . IDA (iron deficiency anemia)   . Hyperparathyroidism   . Arthritis     Past Surgical History    Procedure Date  . Back surgery   . Neck surgery   . Hernia repair     right  . Knee surgery   . Leg surgery   . Shoulder surgery   . Cardiac catheterization 02/06/2005    EF 65-70%.  . Av fistula placement     LEFT ARM  . Cataract extraction, bilateral   . Cholecystectomy   . Transthoracic echocardiogram 04/04/2009    EF 60-65%  . Transthoracic echocardiogram 02/07/2008    EF 55-60%  . Cardiovascular stress test 06/01/2006    EF 55%. NORMAL LV FUNCTION    History  Smoking status  . Never Smoker   Smokeless tobacco  . Never Used    History  Alcohol Use No    Family History  Problem Relation Age of Onset  . Stroke Mother   . Heart attack Father   . Stroke Brother   . Stroke Brother   . Stroke Brother     Review of Systems: The review of systems is positive for  chronic weakness. His appetite has been poor. All other systems were reviewed and are negative.  Physical Exam: BP 118/65  Pulse 75  Wt 158 lb 6.4 oz (71.85 kg) He is an elderly, chronically ill-appearing white male in no acute distress. He is balding. He is normocephalic, atraumatic. Pupils are equal round and reactive to light and accommodation. Extraocular movements are full. Oropharynx is clear. Neck is supple without JVD, adenopathy, thyromegaly, or bruits. Lungs are clear. Cardiac exam reveals a harsh grade 2/6 systolic murmur at the right upper sternal border radiating to left sternal border. He has an AV fistula in his left arm. He has no lower extremity edema. He has diminished pulses in his lower extremities. He is alert and oriented x3. LABORATORY DATA:   Assessment / Plan:

## 2011-01-20 ENCOUNTER — Inpatient Hospital Stay (INDEPENDENT_AMBULATORY_CARE_PROVIDER_SITE_OTHER)
Admission: RE | Admit: 2011-01-20 | Discharge: 2011-01-20 | Disposition: A | Discharge status: Home / Self Care | Payer: Medicare Other | Source: Ambulatory Visit | Attending: Family Medicine | Admitting: Family Medicine

## 2011-01-20 DIAGNOSIS — B372 Candidiasis of skin and nail: Secondary | ICD-10-CM

## 2011-03-12 ENCOUNTER — Ambulatory Visit (INDEPENDENT_AMBULATORY_CARE_PROVIDER_SITE_OTHER): Payer: Medicare Other | Admitting: Cardiology

## 2011-03-12 ENCOUNTER — Telehealth: Payer: Self-pay | Admitting: Cardiology

## 2011-03-12 ENCOUNTER — Encounter: Payer: Self-pay | Admitting: Cardiology

## 2011-03-12 VITALS — BP 110/62 | HR 68 | Ht 70.0 in

## 2011-03-12 DIAGNOSIS — N186 End stage renal disease: Secondary | ICD-10-CM

## 2011-03-12 DIAGNOSIS — I2510 Atherosclerotic heart disease of native coronary artery without angina pectoris: Secondary | ICD-10-CM

## 2011-03-12 DIAGNOSIS — R0789 Other chest pain: Secondary | ICD-10-CM

## 2011-03-12 DIAGNOSIS — I10 Essential (primary) hypertension: Secondary | ICD-10-CM

## 2011-03-12 MED ORDER — NITROGLYCERIN 0.4 MG SL SUBL: 0.4000 mg | SUBLINGUAL_TABLET | SUBLINGUAL | Status: AC | PRN

## 2011-03-12 NOTE — Telephone Encounter (Signed)
New message:  Daughter states that patient stated he needed to see his "heart doctor", daughter states he is more aggitated then normal, chest pain during the night, no Nitro taken.  Still having some discomfort, no nausea, pain not going anywhere. Call went to triage

## 2011-03-12 NOTE — Telephone Encounter (Signed)
Pt states he is having chest pain that started during the night.  He denies any SOB, nausea, or diaophresis.  Spoke with Dr. Elvis Coil nurse and instructed pt to be here for 10:15 appointment today.

## 2011-03-12 NOTE — Patient Instructions (Addendum)
Continue your current medications.  Take Tylenol for your chest pain.  I will see you again in 4 months

## 2011-03-13 NOTE — Assessment & Plan Note (Signed)
Blood pressure is well-controlled today. 

## 2011-03-13 NOTE — Assessment & Plan Note (Signed)
He is really doing quite well from an angina standpoint. His current chest pain is clearly musculoskeletal. I recommended taking Tylenol as needed. I will followup again in 6 months.

## 2011-03-13 NOTE — Progress Notes (Signed)
Gerald Tucker Date of Birth: [birth year] Medical Record #[id number]  History of Present Illness: Gerald Tucker is seen for followup today. He complains that he has had chest pain that has been progressively worse over the past 2-3 weeks. It only occurs at night. It is localized to the left breast region and is described as a sharp stabbing pain. It is relieved with turning over. He reports that the sores on his feet have now healed. He has had no edema. His weight has been stable. He denies any shortness of breath.  Current Outpatient Prescriptions on File Prior to Visit  Medication Sig Dispense Refill  . acetaminophen (TYLENOL) 325 MG tablet Take 650 mg by mouth every 6 (six) hours as needed.        Marland Kitchen aspirin 81 MG tablet Take 81 mg by mouth daily.        . isosorbide mononitrate (IMDUR) 60 MG 24 hr tablet Take 60 mg by mouth daily.        . metoprolol (LOPRESSOR) 50 MG tablet Take 50 mg by mouth 2 (two) times daily.        . Multiple Vitamin (MULTIVITAMIN) tablet Take 1 tablet by mouth daily.        . nitroGLYCERIN (NITROSTAT) 0.4 MG SL tablet Place 1 tablet (0.4 mg total) under the tongue every 5 (five) minutes as needed.  25 tablet  11  . omeprazole (PRILOSEC) 20 MG capsule Take 20 mg by mouth 2 (two) times daily.        . polyethylene glycol (MIRALAX / GLYCOLAX) packet Take 17 g by mouth daily.        . simvastatin (ZOCOR) 20 MG tablet Take 20 mg by mouth at bedtime.          Allergies  Allergen Reactions  . Morphine And Related   . Penicillins   . Ultram (Tramadol Hcl)     Past Medical History  Diagnosis Date  . Hypertension   . Ulcer   . GERD (gastroesophageal reflux disease)   . CAD (coronary artery disease)     distal LAD occlusion  . Poor circulation   . ESRD on dialysis   . Hyperlipidemia   . Cancer   . Knee pain, right   . OSA (obstructive sleep apnea)   . PAD (peripheral artery disease)   . IDA (iron deficiency anemia)   . Hyperparathyroidism   . Arthritis      Past Surgical History  Procedure Date  . Back surgery   . Neck surgery   . Hernia repair     right  . Knee surgery   . Leg surgery   . Shoulder surgery   . Cardiac catheterization 02/06/2005    EF 65-70%.  . Av fistula placement     LEFT ARM  . Cataract extraction, bilateral   . Cholecystectomy   . Transthoracic echocardiogram 04/04/2009    EF 60-65%  . Transthoracic echocardiogram 02/07/2008    EF 55-60%  . Cardiovascular stress test 06/01/2006    EF 55%. NORMAL LV FUNCTION    History  Smoking status  . Never Smoker   Smokeless tobacco  . Never Used    History  Alcohol Use No    Family History  Problem Relation Age of Onset  . Stroke Mother   . Heart attack Father   . Stroke Brother   . Stroke Brother   . Stroke Brother     Review of Systems: The review of systems  is positive for chronic weakness. His appetite has been poor. He remains on hemodialysis. All other systems were reviewed and are negative.  Physical Exam: BP 110/62  Pulse 68  Ht 5\' 10"  (1.778 m) He is an elderly, chronically ill-appearing white male in no acute distress. He is balding. He is normocephalic, atraumatic. Pupils are equal round and reactive to light and accommodation. Extraocular movements are full. Oropharynx is clear. Neck is supple without JVD, adenopathy, thyromegaly, or bruits. Lungs are clear. Cardiac exam reveals a harsh grade 2/6 systolic murmur at the right upper sternal border radiating to left sternal border. He does have chest wall pain to palpation beneath his left breast that reproduces his symptoms. He has an AV fistula in his left arm. He has no lower extremity edema. He has diminished pulses in his lower extremities. He is alert and oriented x3. LABORATORY DATA:   Assessment / Plan:

## 2011-05-20 ENCOUNTER — Other Ambulatory Visit (HOSPITAL_COMMUNITY): Payer: Self-pay | Admitting: Nephrology

## 2011-05-20 DIAGNOSIS — N186 End stage renal disease: Secondary | ICD-10-CM

## 2011-05-21 ENCOUNTER — Ambulatory Visit (HOSPITAL_COMMUNITY)
Admission: RE | Admit: 2011-05-21 | Discharge: 2011-05-21 | Disposition: A | Discharge status: Home / Self Care | Payer: Medicare Other | Source: Ambulatory Visit | Attending: Nephrology | Admitting: Nephrology

## 2011-05-21 ENCOUNTER — Encounter (HOSPITAL_COMMUNITY): Payer: Self-pay

## 2011-05-21 ENCOUNTER — Other Ambulatory Visit (HOSPITAL_COMMUNITY): Payer: Self-pay | Admitting: Nephrology

## 2011-05-21 DIAGNOSIS — Z992 Dependence on renal dialysis: Secondary | ICD-10-CM | POA: Insufficient documentation

## 2011-05-21 DIAGNOSIS — N186 End stage renal disease: Secondary | ICD-10-CM | POA: Insufficient documentation

## 2011-05-21 DIAGNOSIS — Y849 Medical procedure, unspecified as the cause of abnormal reaction of the patient, or of later complication, without mention of misadventure at the time of the procedure: Secondary | ICD-10-CM | POA: Insufficient documentation

## 2011-05-21 DIAGNOSIS — G4733 Obstructive sleep apnea (adult) (pediatric): Secondary | ICD-10-CM | POA: Insufficient documentation

## 2011-05-21 DIAGNOSIS — K219 Gastro-esophageal reflux disease without esophagitis: Secondary | ICD-10-CM | POA: Insufficient documentation

## 2011-05-21 DIAGNOSIS — I2510 Atherosclerotic heart disease of native coronary artery without angina pectoris: Secondary | ICD-10-CM | POA: Insufficient documentation

## 2011-05-21 DIAGNOSIS — I120 Hypertensive chronic kidney disease with stage 5 chronic kidney disease or end stage renal disease: Secondary | ICD-10-CM | POA: Insufficient documentation

## 2011-05-21 DIAGNOSIS — T82898A Other specified complication of vascular prosthetic devices, implants and grafts, initial encounter: Secondary | ICD-10-CM | POA: Insufficient documentation

## 2011-05-21 LAB — POTASSIUM: Potassium: 5.7 mEq/L — ABNORMAL HIGH (ref 3.5–5.1)

## 2011-05-21 MED ORDER — MIDAZOLAM HCL 2 MG/2ML IJ SOLN
INTRAMUSCULAR | Status: AC
  Filled 2011-05-21: qty 2

## 2011-05-21 MED ORDER — HEPARIN SODIUM (PORCINE) 1000 UNIT/ML IJ SOLN
INTRAMUSCULAR | Status: AC
  Filled 2011-05-21: qty 1

## 2011-05-21 MED ORDER — IOHEXOL 300 MG/ML  SOLN
100.0000 mL | Freq: Once | INTRAMUSCULAR | Status: AC | PRN
  Administered 2011-05-21: 60 mL via INTRAVENOUS

## 2011-05-21 MED ORDER — ALTEPLASE 2 MG IJ SOLR
2.0000 mg | Freq: Once | INTRAMUSCULAR | Status: DC
  Filled 2011-05-21: qty 2

## 2011-05-21 MED ORDER — MIDAZOLAM HCL 5 MG/5ML IJ SOLN
INTRAMUSCULAR | Status: AC | PRN
Start: 2011-05-21 — End: 2011-05-21
  Administered 2011-05-21: 0.5 mg via INTRAVENOUS
  Administered 2011-05-21: 1 mg via INTRAVENOUS
  Administered 2011-05-21: 0.5 mg via INTRAVENOUS

## 2011-05-21 NOTE — ED Notes (Signed)
Left arm dressing CDI.  REady for DC.  Instructions reviewed with pt and son.

## 2011-05-21 NOTE — ED Notes (Signed)
To Dialysis after dc

## 2011-05-21 NOTE — ED Notes (Signed)
O2 2 L/[place] added

## 2011-05-21 NOTE — ED Notes (Signed)
Heparin 3000 u given by Dr Fredia Sorrow through access.

## 2011-05-21 NOTE — H&P (Signed)
Gerald Tucker is an 78 y.o. male.   Chief Complaint: Left arm graft unable to be used in dialysis yesterday; clotted HPI: ESRD; HTN; CAD Scheduled for left arm graft thrombolysis and possible angioplasty/stent placement. Possible dialysis catheter placement if necessary  Past Medical History  Diagnosis Date  . Hypertension   . Ulcer   . GERD (gastroesophageal reflux disease)   . CAD (coronary artery disease)     distal LAD occlusion  . Poor circulation   . ESRD on dialysis   . Hyperlipidemia   . Cancer   . Knee pain, right   . OSA (obstructive sleep apnea)   . PAD (peripheral artery disease)   . IDA (iron deficiency anemia)   . Hyperparathyroidism   . Arthritis     Past Surgical History  Procedure Date  . Back surgery   . Neck surgery   . Hernia repair     right  . Knee surgery   . Leg surgery   . Shoulder surgery   . Cardiac catheterization 02/06/2005    EF 65-70%.  . Av fistula placement     LEFT ARM  . Cataract extraction, bilateral   . Cholecystectomy   . Transthoracic echocardiogram 04/04/2009    EF 60-65%  . Transthoracic echocardiogram 02/07/2008    EF 55-60%  . Cardiovascular stress test 06/01/2006    EF 55%. NORMAL LV FUNCTION    Family History  Problem Relation Age of Onset  . Stroke Mother   . Heart attack Father   . Stroke Brother   . Stroke Brother   . Stroke Brother    Social History:  reports that he has never smoked. He has never used smokeless tobacco. He reports that he does not drink alcohol. His drug history not on file.  Allergies:  Allergies  Allergen Reactions  . Morphine And Related   . Penicillins   . Ultram (Tramadol Hcl)     Medications Prior to Admission  Medication Sig Dispense Refill  . acetaminophen (TYLENOL) 325 MG tablet Take 650 mg by mouth every 6 (six) hours as needed.        Marland Kitchen aspirin 81 MG tablet Take 81 mg by mouth daily.        . isosorbide mononitrate (IMDUR) 60 MG 24 hr tablet Take 60 mg by mouth daily.         . metoprolol (LOPRESSOR) 50 MG tablet Take 50 mg by mouth 2 (two) times daily.        . Multiple Vitamin (MULTIVITAMIN) tablet Take 1 tablet by mouth daily.        . nitroGLYCERIN (NITROSTAT) 0.4 MG SL tablet Place 1 tablet (0.4 mg total) under the tongue every 5 (five) minutes as needed.  25 tablet  11  . omeprazole (PRILOSEC) 20 MG capsule Take 20 mg by mouth 2 (two) times daily.        . polyethylene glycol (MIRALAX / GLYCOLAX) packet Take 17 g by mouth daily.        . simvastatin (ZOCOR) 20 MG tablet Take 20 mg by mouth at bedtime.         Medications Prior to Admission  Medication Dose Route Frequency Provider Last Rate Last Dose  . alteplase (CATHFLO ACTIVASE) injection 2 mg  2 mg Intracatheter Once Reola Calkins, MD        No results found for this or any previous visit (from the past 48 hour(s)). No results found.  Review of Systems  Constitutional: Negative for fever.  Respiratory: Negative for cough.   Cardiovascular: Negative for chest pain.  Neurological: Negative for headaches.    Blood pressure 126/78, pulse 79, temperature 98.3 F (36.8 C), temperature source Oral, resp. rate 20, SpO2 99.00%. Physical Exam  Constitutional: He is oriented to person, place, and time. He appears well-developed.  HENT:  Head: Normocephalic.  Cardiovascular: Normal rate, regular rhythm and normal heart sounds.   No murmur heard. Respiratory: Effort normal and breath sounds normal. He has no wheezes.  GI: Soft.  Musculoskeletal:       Uses w/c  Neurological: He is alert and oriented to person, place, and time.       HOH  Skin: Skin is warm.     Assessment/Plan Left arm graft clotted; scheduled for thrombolysis and possible PTA/stent. Possible dialysis catheter placement if necessary. Pt aware procedure benefits and risks and agreeable to proceed. Consent signed.  Raye Slyter A 05/21/2011, 11:35 AM

## 2011-05-21 NOTE — ED Notes (Signed)
K+ 5.7  Placed on cardiac monitor.  Called to Gerald Tucker, [place].  Will decide on kayexalate or dialysis after case. Monitor SR 71

## 2011-05-21 NOTE — H&P (Signed)
Agree 

## 2011-05-21 NOTE — Procedures (Signed)
Procedure:  Left arm AV fistula declot, venous angioplasty and cephalic vein stent placement Findings:  Successful recanalization of occluded cephalic v.  Required stenting. See dictated IR note.

## 2011-05-22 ENCOUNTER — Telehealth (HOSPITAL_COMMUNITY): Payer: Self-pay | Admitting: *Deleted

## 2011-05-22 NOTE — Telephone Encounter (Signed)
Post procedure follow up call.  Spoke with pt, says doing well, no problems.

## 2011-08-27 ENCOUNTER — Other Ambulatory Visit (HOSPITAL_COMMUNITY): Payer: Self-pay | Admitting: Nephrology

## 2011-08-27 ENCOUNTER — Encounter (HOSPITAL_COMMUNITY): Payer: Self-pay

## 2011-08-27 ENCOUNTER — Ambulatory Visit (HOSPITAL_COMMUNITY)
Admission: RE | Admit: 2011-08-27 | Discharge: 2011-08-27 | Disposition: A | Discharge status: Home / Self Care | Payer: Medicare Other | Source: Ambulatory Visit | Attending: Nephrology | Admitting: Nephrology

## 2011-08-27 VITALS — BP 123/65 | HR 84 | Resp 25

## 2011-08-27 DIAGNOSIS — Y849 Medical procedure, unspecified as the cause of abnormal reaction of the patient, or of later complication, without mention of misadventure at the time of the procedure: Secondary | ICD-10-CM | POA: Insufficient documentation

## 2011-08-27 DIAGNOSIS — K219 Gastro-esophageal reflux disease without esophagitis: Secondary | ICD-10-CM | POA: Insufficient documentation

## 2011-08-27 DIAGNOSIS — I2510 Atherosclerotic heart disease of native coronary artery without angina pectoris: Secondary | ICD-10-CM | POA: Insufficient documentation

## 2011-08-27 DIAGNOSIS — Z992 Dependence on renal dialysis: Secondary | ICD-10-CM

## 2011-08-27 DIAGNOSIS — N186 End stage renal disease: Secondary | ICD-10-CM

## 2011-08-27 DIAGNOSIS — T82898A Other specified complication of vascular prosthetic devices, implants and grafts, initial encounter: Secondary | ICD-10-CM | POA: Insufficient documentation

## 2011-08-27 DIAGNOSIS — G4733 Obstructive sleep apnea (adult) (pediatric): Secondary | ICD-10-CM | POA: Insufficient documentation

## 2011-08-27 DIAGNOSIS — I120 Hypertensive chronic kidney disease with stage 5 chronic kidney disease or end stage renal disease: Secondary | ICD-10-CM | POA: Insufficient documentation

## 2011-08-27 LAB — POCT I-STAT 4, (NA,K, GLUC, HGB,HCT): HCT: 31 % — ABNORMAL LOW (ref 39.0–52.0)

## 2011-08-27 MED ORDER — FENTANYL CITRATE 0.05 MG/ML IJ SOLN
INTRAMUSCULAR | Status: AC | PRN
  Administered 2011-08-27: 12.5 ug via INTRAVENOUS
  Administered 2011-08-27: 25 ug via INTRAVENOUS
  Administered 2011-08-27: 12.5 ug via INTRAVENOUS

## 2011-08-27 MED ORDER — HEPARIN SODIUM (PORCINE) 1000 UNIT/ML IJ SOLN
INTRAMUSCULAR | Status: AC
  Filled 2011-08-27: qty 1

## 2011-08-27 MED ORDER — HEPARIN SODIUM (PORCINE) 1000 UNIT/ML IJ SOLN
INTRAMUSCULAR | Status: AC | PRN
  Administered 2011-08-27: 3000 [IU] via INTRAVENOUS

## 2011-08-27 MED ORDER — FENTANYL CITRATE 0.05 MG/ML IJ SOLN
INTRAMUSCULAR | Status: AC
  Filled 2011-08-27: qty 4

## 2011-08-27 MED ORDER — SODIUM POLYSTYRENE SULFONATE 15 GM/60ML PO SUSP: 15.0000 g | Freq: Once | ORAL | Status: DC

## 2011-08-27 MED ORDER — ALTEPLASE 100 MG IV SOLR
2.0000 mg | Freq: Once | INTRAVENOUS | Status: AC
  Administered 2011-08-27: 2 mg
  Filled 2011-08-27 (×2): qty 2

## 2011-08-27 MED ORDER — SODIUM POLYSTYRENE SULFONATE 15 GM/60ML PO SUSP
15.0000 g | Freq: Once | ORAL | Status: AC
  Administered 2011-08-27: 15 g via ORAL
  Filled 2011-08-27: qty 60

## 2011-08-27 MED ORDER — MIDAZOLAM HCL 5 MG/5ML IJ SOLN
INTRAMUSCULAR | Status: AC | PRN
  Administered 2011-08-27 (×2): 0.5 mg via INTRAVENOUS
  Administered 2011-08-27: 1 mg via INTRAVENOUS

## 2011-08-27 MED ORDER — MIDAZOLAM HCL 2 MG/2ML IJ SOLN
INTRAMUSCULAR | Status: AC
  Filled 2011-08-27: qty 4

## 2011-08-27 MED ORDER — FENTANYL CITRATE 0.05 MG/ML IJ SOLN
INTRAMUSCULAR | Status: AC
  Filled 2011-08-27: qty 2

## 2011-08-27 MED ORDER — IOHEXOL 300 MG/ML  SOLN
100.0000 mL | Freq: Once | INTRAMUSCULAR | Status: AC | PRN
  Administered 2011-08-27: 60 mL via INTRAVENOUS

## 2011-08-27 NOTE — Discharge Instructions (Signed)
Moderate Sedation, Adult Moderate sedation is given to help you relax or even sleep through a procedure. You may remain sleepy, be clumsy, or have poor balance for several hours following this procedure. Arrange for a responsible adult, family member, or friend to take you home. A responsible adult should stay with you for at least 24 hours or until the medicines have worn off.  Do not participate in any activities where you could become injured for the next 24 hours, or until you feel normal again. Do not:   Drive.   Swim.   Ride a bicycle.   Operate heavy machinery.   Cook.   Use power tools.   Climb ladders.   Work at heights.   Do not make important decisions or sign legal documents until you are improved.   Vomiting may occur if you eat too soon. When you can drink without vomiting, try water, juice, or soup. Try solid foods if you feel little or no nausea.   Only take over-the-counter or prescription medications for pain, discomfort, or fever as directed by your caregiver.If pain medications have been prescribed for you, ask your caregiver how soon it is safe to take them.   Make sure you and your family fully understands everything about the medication given to you. Make sure you understand what side effects may occur.   You should not drink alcohol, take sleeping pills, or medications that cause drowsiness for at least 24 hours.   If you smoke, do not smoke alone.   If you are feeling better, you may resume normal activities 24 hours after receiving sedation.   Keep all appointments as scheduled. Follow all instructions.   Ask questions if you do not understand.  SEEK MEDICAL CARE IF:   Your skin is pale or bluish in color.   You continue to feel sick to your stomach (nauseous) or throw up (vomit).   Your pain is getting worse and not helped by medication.   You have bleeding or swelling.   You are still sleepy or feeling clumsy after 24 hours.  SEEK IMMEDIATE  MEDICAL CARE IF:   You develop a rash.   You have difficulty breathing.   You develop any type of allergic problem.   You have a fever.  Document Released: 12/02/2000 Document Revised: 02/26/2011 Document Reviewed: 04/25/2007 [employer] Patient Information 2012 [employer].Moderate Sedation, Adult Moderate sedation is given to help you relax or even sleep through a procedure. You may remain sleepy, be clumsy, or have poor balance for several hours following this procedure. Arrange for a responsible adult, family member, or friend to take you home. A responsible adult should stay with you for at least 24 hours or until the medicines have worn off.  Do not participate in any activities where you could become injured for the next 24 hours, or until you feel normal again. Do not:   Drive.   Swim.   Ride a bicycle.   Operate heavy machinery.   Cook.   Use power tools.   Climb ladders.   Work at heights.   Do not make important decisions or sign legal documents until you are improved.   Vomiting may occur if you eat too soon. When you can drink without vomiting, try water, juice, or soup. Try solid foods if you feel little or no nausea.   Only take over-the-counter or prescription medications for pain, discomfort, or fever as directed by your caregiver.If pain medications have been prescribed for you,   ask your caregiver how soon it is safe to take them.   Make sure you and your family fully understands everything about the medication given to you. Make sure you understand what side effects may occur.   You should not drink alcohol, take sleeping pills, or medications that cause drowsiness for at least 24 hours.   If you smoke, do not smoke alone.   If you are feeling better, you may resume normal activities 24 hours after receiving sedation.   Keep all appointments as scheduled. Follow all instructions.   Ask questions if you do not understand.  SEEK MEDICAL CARE IF:    Your skin is pale or bluish in color.   You continue to feel sick to your stomach (nauseous) or throw up (vomit).   Your pain is getting worse and not helped by medication.   You have bleeding or swelling.   You are still sleepy or feeling clumsy after 24 hours.  SEEK IMMEDIATE MEDICAL CARE IF:   You develop a rash.   You have difficulty breathing.   You develop any type of allergic problem.   You have a fever.  Document Released: 12/02/2000 Document Revised: 02/26/2011 Document Reviewed: 04/25/2007 [employer] Patient Information 2012 [employer]. 

## 2011-08-27 NOTE — H&P (Signed)
Gerald Tucker is an 78 y.o. male.   Chief Complaint: slow flows in left arm fistula and was scheduled for shuntogram.  Shuntogram shows clotted fistula- now scheduled for thrombolysis and possible angioplasty/stent placement. Possible dialysis catheter placement if necessary. HPI: last declot on L arm fistula was 05/21/11- Dr Fredia Sorrow: successful and amenable to further intervention if needed. HTN; GERD; ESRD; CAD  Past Medical History  Diagnosis Date  . Hypertension   . Ulcer   . GERD (gastroesophageal reflux disease)   . CAD (coronary artery disease)     distal LAD occlusion  . Poor circulation   . ESRD on dialysis   . Hyperlipidemia   . Cancer   . Knee pain, right   . OSA (obstructive sleep apnea)   . PAD (peripheral artery disease)   . IDA (iron deficiency anemia)   . Hyperparathyroidism   . Arthritis     Past Surgical History  Procedure Date  . Back surgery   . Neck surgery   . Hernia repair     right  . Knee surgery   . Leg surgery   . Shoulder surgery   . Cardiac catheterization 02/06/2005    EF 65-70%.  . Av fistula placement     LEFT ARM  . Cataract extraction, bilateral   . Cholecystectomy   . Transthoracic echocardiogram 04/04/2009    EF 60-65%  . Transthoracic echocardiogram 02/07/2008    EF 55-60%  . Cardiovascular stress test 06/01/2006    EF 55%. NORMAL LV FUNCTION    Family History  Problem Relation Age of Onset  . Stroke Mother   . Heart attack Father   . Stroke Brother   . Stroke Brother   . Stroke Brother    Social History:  reports that he has never smoked. He has never used smokeless tobacco. He reports that he does not drink alcohol. His drug history not on file.  Allergies:  Allergies  Allergen Reactions  . Morphine And Related   . Penicillins   . Ultram (Tramadol Hcl)   . Amoxicillin   . Darvocet (Propoxyphene-Acetaminophen)   . Keflex (Cephalexin)   . Neurontin (Gabapentin)   . Tramadol      (Not in a hospital  admission)  Results for orders placed during the hospital encounter of 08/27/11 (from the past 48 hour(s))  POCT I-STAT 4, (NA,K, GLUC, HGB,HCT)     Status: Abnormal   Collection Time   08/27/11  1:45 PM      Component Value Range Comment   Sodium 138  135 - 145 (mEq/L)    Potassium 5.2 (*) 3.5 - 5.1 (mEq/L)    Glucose, Bld 123 (*) 70 - 99 (mg/dL)    HCT 16.1 (*) 09.6 - 52.0 (%)    Hemoglobin 10.5 (*) 13.0 - 17.0 (g/dL)    No results found.  Review of Systems  Constitutional: Negative for fever.  Respiratory: Negative for cough.   Cardiovascular: Negative for chest pain.  Gastrointestinal: Negative for nausea, vomiting and abdominal pain.  Neurological: Negative for dizziness and headaches.    There were no vitals taken for this visit. Physical Exam  Constitutional: He is oriented to person, place, and time. He appears well-developed and well-nourished.  HENT:       HOH  Cardiovascular: Normal rate, regular rhythm and normal heart sounds.   No murmur heard. Respiratory: Effort normal and breath sounds normal. He has no wheezes.  GI: Soft. Bowel sounds are normal. There is no  tenderness.  Musculoskeletal: Normal range of motion.       Uses wc  Neurological: He is alert and oriented to person, place, and time.  Skin: Skin is warm and dry.  Psychiatric: He has a normal mood and affect. His behavior is normal. Judgment and thought content normal.     Assessment/Plan Left arm fistula clotted Scheduled for thrombolysis and possible angioplasty/stent placement. Possible dialysis catheter placement. Pt aware of procedure benefits and risks and agreeable to proceed. Consent signed.  Tionne Carelli A 08/27/2011, 2:26 PM

## 2011-08-27 NOTE — ED Notes (Signed)
Printed info for Kayexalate administration printed and will be reviewed with pt and family member at time of discharge

## 2011-08-27 NOTE — Procedures (Signed)
Procedure:  Left arm AV fistula declot, angioplasty and cephalic vein stent placement Findings:  Cephalic v outflow of AVF occluded.  7 and 8 mm angioplasty of cephalic v.  Central cephalic occlusion crossed and stented w/ new 8x55mm and 10x89mm Zilver stents.  Good patency and flow after procedure.

## 2011-08-27 NOTE — H&P (Signed)
Agree.  Fistula clotted.  Will proceed with declot procedure.

## 2011-10-01 ENCOUNTER — Other Ambulatory Visit: Payer: Self-pay

## 2011-12-21 ENCOUNTER — Other Ambulatory Visit: Payer: Self-pay | Admitting: Cardiology

## 2011-12-21 DIAGNOSIS — I739 Peripheral vascular disease, unspecified: Secondary | ICD-10-CM

## 2011-12-29 ENCOUNTER — Other Ambulatory Visit: Payer: Self-pay | Admitting: *Deleted

## 2011-12-29 ENCOUNTER — Encounter (INDEPENDENT_AMBULATORY_CARE_PROVIDER_SITE_OTHER): Payer: Medicare Other

## 2011-12-29 DIAGNOSIS — I70229 Atherosclerosis of native arteries of extremities with rest pain, unspecified extremity: Secondary | ICD-10-CM

## 2011-12-29 DIAGNOSIS — I739 Peripheral vascular disease, unspecified: Secondary | ICD-10-CM

## 2012-01-01 ENCOUNTER — Encounter (HOSPITAL_COMMUNITY): Payer: Self-pay | Admitting: Emergency Medicine

## 2012-01-01 ENCOUNTER — Inpatient Hospital Stay (HOSPITAL_COMMUNITY)
Admission: EM | Admit: 2012-01-01 | Discharge: 2012-01-04 | DRG: 689 | Disposition: A | Discharge status: Home / Self Care | Payer: Medicare Other | Attending: Internal Medicine | Admitting: Internal Medicine

## 2012-01-01 ENCOUNTER — Emergency Department (HOSPITAL_COMMUNITY): Payer: Medicare Other

## 2012-01-01 DIAGNOSIS — R413 Other amnesia: Secondary | ICD-10-CM | POA: Diagnosis present

## 2012-01-01 DIAGNOSIS — Z79899 Other long term (current) drug therapy: Secondary | ICD-10-CM

## 2012-01-01 DIAGNOSIS — I4891 Unspecified atrial fibrillation: Secondary | ICD-10-CM | POA: Diagnosis present

## 2012-01-01 DIAGNOSIS — Z823 Family history of stroke: Secondary | ICD-10-CM

## 2012-01-01 DIAGNOSIS — I739 Peripheral vascular disease, unspecified: Secondary | ICD-10-CM | POA: Diagnosis present

## 2012-01-01 DIAGNOSIS — Z885 Allergy status to narcotic agent status: Secondary | ICD-10-CM

## 2012-01-01 DIAGNOSIS — E785 Hyperlipidemia, unspecified: Secondary | ICD-10-CM | POA: Diagnosis present

## 2012-01-01 DIAGNOSIS — Z992 Dependence on renal dialysis: Secondary | ICD-10-CM

## 2012-01-01 DIAGNOSIS — Z7982 Long term (current) use of aspirin: Secondary | ICD-10-CM

## 2012-01-01 DIAGNOSIS — G92 Toxic encephalopathy: Secondary | ICD-10-CM | POA: Diagnosis present

## 2012-01-01 DIAGNOSIS — E860 Dehydration: Secondary | ICD-10-CM

## 2012-01-01 DIAGNOSIS — K5909 Other constipation: Secondary | ICD-10-CM | POA: Diagnosis present

## 2012-01-01 DIAGNOSIS — N390 Urinary tract infection, site not specified: Principal | ICD-10-CM | POA: Diagnosis present

## 2012-01-01 DIAGNOSIS — I120 Hypertensive chronic kidney disease with stage 5 chronic kidney disease or end stage renal disease: Secondary | ICD-10-CM | POA: Diagnosis present

## 2012-01-01 DIAGNOSIS — M129 Arthropathy, unspecified: Secondary | ICD-10-CM | POA: Diagnosis present

## 2012-01-01 DIAGNOSIS — I2510 Atherosclerotic heart disease of native coronary artery without angina pectoris: Secondary | ICD-10-CM | POA: Diagnosis present

## 2012-01-01 DIAGNOSIS — Z881 Allergy status to other antibiotic agents status: Secondary | ICD-10-CM

## 2012-01-01 DIAGNOSIS — B372 Candidiasis of skin and nail: Secondary | ICD-10-CM | POA: Diagnosis present

## 2012-01-01 DIAGNOSIS — I10 Essential (primary) hypertension: Secondary | ICD-10-CM | POA: Diagnosis present

## 2012-01-01 DIAGNOSIS — Z888 Allergy status to other drugs, medicaments and biological substances status: Secondary | ICD-10-CM

## 2012-01-01 DIAGNOSIS — K219 Gastro-esophageal reflux disease without esophagitis: Secondary | ICD-10-CM | POA: Diagnosis present

## 2012-01-01 DIAGNOSIS — Z886 Allergy status to analgesic agent status: Secondary | ICD-10-CM

## 2012-01-01 DIAGNOSIS — D509 Iron deficiency anemia, unspecified: Secondary | ICD-10-CM | POA: Diagnosis present

## 2012-01-01 DIAGNOSIS — G929 Unspecified toxic encephalopathy: Secondary | ICD-10-CM | POA: Diagnosis present

## 2012-01-01 DIAGNOSIS — N2581 Secondary hyperparathyroidism of renal origin: Secondary | ICD-10-CM | POA: Diagnosis present

## 2012-01-01 DIAGNOSIS — F39 Unspecified mood [affective] disorder: Secondary | ICD-10-CM | POA: Diagnosis present

## 2012-01-01 DIAGNOSIS — K5904 Chronic idiopathic constipation: Secondary | ICD-10-CM | POA: Diagnosis present

## 2012-01-01 DIAGNOSIS — N186 End stage renal disease: Secondary | ICD-10-CM | POA: Diagnosis present

## 2012-01-01 DIAGNOSIS — Z9181 History of falling: Secondary | ICD-10-CM

## 2012-01-01 DIAGNOSIS — Z8249 Family history of ischemic heart disease and other diseases of the circulatory system: Secondary | ICD-10-CM

## 2012-01-01 DIAGNOSIS — G4733 Obstructive sleep apnea (adult) (pediatric): Secondary | ICD-10-CM | POA: Diagnosis present

## 2012-01-01 DIAGNOSIS — Z882 Allergy status to sulfonamides status: Secondary | ICD-10-CM

## 2012-01-01 DIAGNOSIS — Z9089 Acquired absence of other organs: Secondary | ICD-10-CM

## 2012-01-01 DIAGNOSIS — Z880 Allergy status to penicillin: Secondary | ICD-10-CM

## 2012-01-01 LAB — URINE MICROSCOPIC-ADD ON

## 2012-01-01 LAB — URINALYSIS, ROUTINE W REFLEX MICROSCOPIC
Ketones, ur: NEGATIVE mg/dL
Nitrite: NEGATIVE
Protein, ur: 100 mg/dL — AB
Urobilinogen, UA: 0.2 mg/dL (ref 0.0–1.0)
pH: 7 (ref 5.0–8.0)

## 2012-01-01 LAB — CBC WITH DIFFERENTIAL/PLATELET
Basophils Relative: 1 % (ref 0–1)
Eosinophils Absolute: 0.2 10*3/uL (ref 0.0–0.7)
HCT: 33.4 % — ABNORMAL LOW (ref 39.0–52.0)
Hemoglobin: 11 g/dL — ABNORMAL LOW (ref 13.0–17.0)
MCH: 32.3 pg (ref 26.0–34.0)
MCHC: 32.9 g/dL (ref 30.0–36.0)
Monocytes Absolute: 0.7 10*3/uL (ref 0.1–1.0)
Monocytes Relative: 7 % (ref 3–12)
Neutro Abs: 8.6 10*3/uL — ABNORMAL HIGH (ref 1.7–7.7)

## 2012-01-01 LAB — COMPREHENSIVE METABOLIC PANEL
Albumin: 2.9 g/dL — ABNORMAL LOW (ref 3.5–5.2)
BUN: 29 mg/dL — ABNORMAL HIGH (ref 6–23)
Chloride: 95 mEq/L — ABNORMAL LOW (ref 96–112)
Creatinine, Ser: 4.5 mg/dL — ABNORMAL HIGH (ref 0.50–1.35)
GFR calc Af Amer: 12 mL/min — ABNORMAL LOW (ref >90)
Total Bilirubin: 0.6 mg/dL (ref 0.3–1.2)

## 2012-01-01 LAB — TROPONIN I
Troponin I: <0.3 ng/mL (ref <0.30)
Troponin I: <0.3 ng/mL (ref <0.30)

## 2012-01-01 LAB — LIPASE, BLOOD: Lipase: 54 U/L (ref 11–59)

## 2012-01-01 LAB — LACTIC ACID, PLASMA: Lactic Acid, Venous: 1.2 mmol/L (ref 0.5–2.2)

## 2012-01-01 MED ORDER — CALCIUM ACETATE 667 MG PO CAPS
1334.0000 mg | ORAL_CAPSULE | Freq: Three times a day (TID) | ORAL | Status: DC
Start: 2012-01-02 — End: 2012-01-04
  Administered 2012-01-02 – 2012-01-04 (×6): 1334 mg via ORAL
  Filled 2012-01-01 (×10): qty 2

## 2012-01-01 MED ORDER — ASPIRIN 81 MG PO CHEW
81.0000 mg | CHEWABLE_TABLET | Freq: Every day | ORAL | Status: DC
  Administered 2012-01-01 – 2012-01-04 (×4): 81 mg via ORAL
  Filled 2012-01-01 (×4): qty 1

## 2012-01-01 MED ORDER — ONDANSETRON HCL 4 MG PO TABS: 4.0000 mg | ORAL_TABLET | Freq: Four times a day (QID) | ORAL | Status: DC | PRN

## 2012-01-01 MED ORDER — SIMVASTATIN 20 MG PO TABS
20.0000 mg | ORAL_TABLET | Freq: Every day | ORAL | Status: DC
  Administered 2012-01-01 – 2012-01-03 (×3): 20 mg via ORAL
  Filled 2012-01-01 (×4): qty 1

## 2012-01-01 MED ORDER — BISACODYL 10 MG RE SUPP
10.0000 mg | Freq: Every day | RECTAL | Status: DC | PRN
  Administered 2012-01-03: 10 mg via RECTAL
  Filled 2012-01-01: qty 1

## 2012-01-01 MED ORDER — TRAMADOL HCL 50 MG PO TABS
50.0000 mg | ORAL_TABLET | Freq: Every day | ORAL | Status: DC
  Administered 2012-01-01 – 2012-01-04 (×4): 50 mg via ORAL
  Filled 2012-01-01 (×4): qty 1

## 2012-01-01 MED ORDER — SODIUM CHLORIDE 0.9 % IV BOLUS (SEPSIS)
500.0000 mL | Freq: Once | INTRAVENOUS | Status: AC
  Administered 2012-01-01: 500 mL via INTRAVENOUS

## 2012-01-01 MED ORDER — PANTOPRAZOLE SODIUM 40 MG PO TBEC
40.0000 mg | DELAYED_RELEASE_TABLET | Freq: Every day | ORAL | Status: DC
  Administered 2012-01-02 – 2012-01-04 (×3): 40 mg via ORAL
  Filled 2012-01-01 (×3): qty 1

## 2012-01-01 MED ORDER — ISOSORBIDE MONONITRATE ER 60 MG PO TB24
60.0000 mg | ORAL_TABLET | Freq: Every day | ORAL | Status: DC
Start: 2012-01-01 — End: 2012-01-04
  Administered 2012-01-02 – 2012-01-04 (×3): 60 mg via ORAL
  Filled 2012-01-01 (×4): qty 1

## 2012-01-01 MED ORDER — FLUCONAZOLE 200 MG PO TABS
200.0000 mg | ORAL_TABLET | Freq: Every day | ORAL | Status: DC
  Administered 2012-01-01 – 2012-01-04 (×4): 200 mg via ORAL
  Filled 2012-01-01 (×5): qty 1

## 2012-01-01 MED ORDER — CIPROFLOXACIN IN D5W 400 MG/200ML IV SOLN
400.0000 mg | Freq: Once | INTRAVENOUS | Status: AC
Start: 2012-01-01 — End: 2012-01-01
  Administered 2012-01-01: 400 mg via INTRAVENOUS
  Filled 2012-01-01: qty 200

## 2012-01-01 MED ORDER — ACETAMINOPHEN 650 MG RE SUPP: 650.0000 mg | Freq: Four times a day (QID) | RECTAL | Status: DC | PRN

## 2012-01-01 MED ORDER — ACETAMINOPHEN 325 MG PO TABS
650.0000 mg | ORAL_TABLET | Freq: Four times a day (QID) | ORAL | Status: DC | PRN
  Administered 2012-01-03: 650 mg via ORAL
  Filled 2012-01-01: qty 2

## 2012-01-01 MED ORDER — LEVALBUTEROL HCL 0.63 MG/3ML IN NEBU
0.6300 mg | INHALATION_SOLUTION | Freq: Four times a day (QID) | RESPIRATORY_TRACT | Status: DC | PRN
  Filled 2012-01-01: qty 3

## 2012-01-01 MED ORDER — METOPROLOL TARTRATE 50 MG PO TABS
50.0000 mg | ORAL_TABLET | Freq: Two times a day (BID) | ORAL | Status: DC
  Administered 2012-01-02 – 2012-01-04 (×5): 50 mg via ORAL
  Filled 2012-01-01 (×7): qty 1

## 2012-01-01 MED ORDER — ONDANSETRON HCL 4 MG/2ML IJ SOLN: 4.0000 mg | Freq: Four times a day (QID) | INTRAMUSCULAR | Status: DC | PRN

## 2012-01-01 MED ORDER — ENOXAPARIN SODIUM 30 MG/0.3ML ~~LOC~~ SOLN
30.0000 mg | SUBCUTANEOUS | Status: DC
  Filled 2012-01-01: qty 0.3

## 2012-01-01 MED ORDER — SODIUM CHLORIDE 0.9 % IJ SOLN
3.0000 mL | Freq: Two times a day (BID) | INTRAMUSCULAR | Status: DC
  Administered 2012-01-01 – 2012-01-03 (×5): 3 mL via INTRAVENOUS

## 2012-01-01 NOTE — ED Notes (Signed)
Pt denies pain when asked the pain scale, but patient grimaces and asks staff not to touch left wrist.  Pt complains of constipation.

## 2012-01-01 NOTE — H&P (Signed)
[facility] History and Physical  Gerald Tucker GNF:[id number] DOB: [birth year] DOA: 01/01/2012  Referring physician: Gwyneth Sprout, MD  PCP: Minda Meo, MD   Chief Complaint:  ams   HPI:  78 year old gentleman with multiple medical problems including end-stage renal disease on dialysis M,W,F. For the last 3-4 days he's been having visual hallucinations which is new for him,per daughter coincides with him taking pain medicines.recently has been so confused that he has been drinking liquid soap, thinking that it is mouthwash. He recently had a scrotal infection for which he had 2 courses of antibiotics. Most of the history is provided with the daughter, she states that the patient has had a couple of bouts of diarrhea each lasting 4-5 days. He also had associated nausea vomiting dry heaving    His son also states that he has had multiple episodes of vomiting in the last week which is also different for him. He states he has not been eating or drinking well. Also the patient is complaining of a lot of lower extremity pain but he has peripheral vascular disease and recently had vein mapping /ABI done of his legs that suggested bilateral peripheral vascular disease.   Patient had a mechanical fall this morning and has been complaining of left-sided hip and rib pain. Currently on exam patient is fatigued but he is able to answer questions. He also states that he has not had a normal bowel movement in over a week. However he denies abdominal pain.  While patient was at dialysis today he started complaining of feeling" queasy." Dialysis was concerned that he was dehydrated and did not look well. They sent him here for further evaluation . In the ER he was found to have a UTI  The history is provided by the patient, a relative and a caregiver.        Review of Systems: negative for the following   Constitutional: Positive for appetite change. Negative for fever and chills.    Respiratory: Negative for cough and shortness of breath.  Cardiovascular: Positive for chest pain.  Since the fall last night pain in the left rib cage  Gastrointestinal: Positive for nausea, vomiting and constipation. Negative for abdominal pain and diarrhea.  Genitourinary: Negative for dysuria.  Psychiatric/Behavioral: Positive for hallucinations   Genitourinary: Blood in urine, difficulty urinating. Women only, change in menstrual period, irregular menstrual periods, excessively heavy bleeding   Musculoskeletal: Arthritis, abnormal weakness of muscles   Breasts: New breast lumps, nipple discharge or irritation   Neurological: Dizziness, numbing/tingling, loss of balance when walking   Psychiatric: Depression, mood swings, significant and unexplained memory loss  Endocrine: Excessively hot or cold, unexplained weight loss or gain   Hematologic/Lymphatic: Swollen glands, easy bruising   Allergic/Immunologic: Hives, runny nose, sneezing      Past Medical History  Diagnosis Date  . Hypertension   . Ulcer   . GERD (gastroesophageal reflux disease)   . CAD (coronary artery disease)     distal LAD occlusion  . Poor circulation   . ESRD on dialysis   . Hyperlipidemia   . Cancer   . Knee pain, right   . OSA (obstructive sleep apnea)   . PAD (peripheral artery disease)   . IDA (iron deficiency anemia)   . Hyperparathyroidism   . Arthritis      Past Surgical History  Procedure Date  . Back surgery   . Neck surgery   . Hernia repair     right  . Knee  surgery   . Leg surgery   . Shoulder surgery   . Cardiac catheterization 02/06/2005    EF 65-70%.  . Av fistula placement     LEFT ARM  . Cataract extraction, bilateral   . Cholecystectomy   . Transthoracic echocardiogram 04/04/2009    EF 60-65%  . Transthoracic echocardiogram 02/07/2008    EF 55-60%  . Cardiovascular stress test 06/01/2006    EF 55%. NORMAL LV FUNCTION      Social History:  reports that he has  never smoked. He has never used smokeless tobacco. He reports that he does not drink alcohol. His drug history not on file.    Allergies  Allergen Reactions  . Morphine And Related   . Penicillins   . Ultram (Tramadol Hcl)   . Amoxicillin   . Darvocet (Propoxyphene-Acetaminophen)   . Keflex (Cephalexin)   . Neurontin (Gabapentin)   . Tramadol     Family History  Problem Relation Age of Onset  . Stroke Mother   . Heart attack Father   . Stroke Brother   . Stroke Brother   . Stroke Brother      Prior to Admission medications   Medication Sig Start Date End Date Taking? Authorizing Provider  acetaminophen (TYLENOL) 325 MG tablet Take 650 mg by mouth every 6 (six) hours as needed. For pain   Yes Historical Provider, MD  aspirin 81 MG tablet Take 81 mg by mouth daily.     Yes Historical Provider, MD  calcium acetate (PHOSLO) 667 MG capsule Take 1,334 mg by mouth 3 (three) times daily with meals.   Yes Historical Provider, MD  fluconazole (DIFLUCAN) 200 MG tablet Take 200 mg by mouth daily. For 3 days, started on 12/29/11 last dose should be today   Yes Historical Provider, MD  isosorbide mononitrate (IMDUR) 60 MG 24 hr tablet Take 60 mg by mouth daily.     Yes Historical Provider, MD  metoprolol (LOPRESSOR) 50 MG tablet Take 50 mg by mouth 2 (two) times daily.     Yes Historical Provider, MD  Multiple Vitamin (MULTIVITAMIN) tablet Take 1 tablet by mouth daily.     Yes Historical Provider, MD  nystatin ointment (MYCOSTATIN) Apply 1 application topically 2 (two) times daily.   Yes Historical Provider, MD  omeprazole (PRILOSEC) 20 MG capsule Take 20 mg by mouth 2 (two) times daily.     Yes Historical Provider, MD  polyethylene glycol (MIRALAX / GLYCOLAX) packet Take 17 g by mouth daily.     Yes Historical Provider, MD  simvastatin (ZOCOR) 20 MG tablet Take 20 mg by mouth at bedtime.     Yes Historical Provider, MD  traMADol (ULTRAM) 50 MG tablet Take 50 mg by mouth daily.   Yes  Historical Provider, MD  nitroGLYCERIN (NITROSTAT) 0.4 MG SL tablet Place 1 tablet (0.4 mg total) under the tongue every 5 (five) minutes as needed. 03/12/11   Peter M [place], MD     Physical Exam: Filed Vitals:   01/01/12 1429 01/01/12 1600  BP: 110/75   Pulse: 100   Temp: 97.6 F (36.4 C)   Height: 5\' 10"  (1.778 m)   SpO2: 98% 100%    Head: Normocephalic and atraumatic.  Mouth/Throat: Oropharynx is clear and moist.  Eyes: Conjunctivae normal and EOM are normal. Pupils are equal, round, and reactive to light.  Neck: Normal range of motion. Neck supple.  Cardiovascular: Normal rate, regular rhythm and intact distal pulses.  No murmur heard.  Pulmonary/Chest: Effort normal and breath sounds normal. No respiratory distress. He has no wheezes. He has no rales.  Abdominal: Soft. He exhibits no distension. There is no tenderness. There is no rebound and no guarding.  Genitourinary:  Soft stool in the rectal vault  Musculoskeletal: Normal range of motion. He exhibits tenderness. He exhibits no edema.  Pt does not appear to be tender in the left hip. Able to range without pain. Graft in the left upper ext with normal strong pulse but ecchymosis around the puncture site. Normal radial pulses bilaterally. Multiple early necrotic lesions in the bilateral toes with cool feet and no palpable pulses. Mid foot bilaterally is warm.  Neurological: He is alert and oriented to person, place, and time.  Skin: Skin is warm and dry. No rash noted. No erythema.  Psychiatric: He has a normal mood and affect. His behavior is normal.      Labs on Admission:    Basic Metabolic Panel:  Lab 01/01/12 2956  NA 138  K 3.8  CL 95*  CO2 29  GLUCOSE 87  BUN 29*  CREATININE 4.50*  CALCIUM 9.4  MG --  PHOS --   Liver Function Tests:  Lab 01/01/12 1633  AST 46*  ALT 31  ALKPHOS 73  BILITOT 0.6  PROT 6.5  ALBUMIN 2.9*    Lab 01/01/12 1633  LIPASE 54  AMYLASE --   No results found for  this basename: AMMONIA:5 in the last 168 hours CBC:  Lab 01/01/12 1633  WBC 10.9*  NEUTROABS 8.6*  HGB 11.0*  HCT 33.4*  MCV 97.9  PLT 285   Cardiac Enzymes:  Lab 01/01/12 1650  CKTOTAL --  CKMB --  CKMBINDEX --  TROPONINI <0.30    BNP (last 3 results) No results found for this basename: PROBNP:3 in the last 8760 hours    CBG: No results found for this basename: GLUCAP:5 in the last 168 hours  Radiological Exams on Admission: Dg Chest 2 View  01/01/2012  *RADIOLOGY REPORT*  Clinical Data: Left rib pain with fall.  CHEST - 2 VIEW  Comparison: 08/22/2010.  Findings: No contusion, effusion or pneumothorax.  No acute rib abnormalities.  Chronic deformities left sixth and seventh ribs. Elevated right hemidiaphragm.  The lungs are clear.  The heart and mediastinal structures are normal. Incidental note made of a fusion plate in the lower cervical spine and vascular stent in the region of the left axilla.  IMPRESSION: No evidence of acute trauma.  No active disease.   Original Report Authenticated By: Mervin Hack, M.D.    Dg Hip Complete Left  01/01/2012  *RADIOLOGY REPORT*  Clinical Data: Left hip pain post fall  LEFT HIP - COMPLETE 2+ VIEW  Comparison: Pelvic radiograph 01/21/2010  Findings: Prior lower lumbar fusion. Diffuse osseous demineralization. Symmetric hip and SI joints. No acute fracture, dislocation, or bone destruction. Extensive atherosclerotic calcification.  IMPRESSION: Osseous demineralization. No acute abnormalities.   Original Report Authenticated By: Lollie Marrow, M.D.     EKG: Independently reviewed. Rate: 102  Rhythm: atrial fibrillation  QRS Axis: normal  Intervals: a.fib  ST/T Wave abnormalities: nonspecific ST/T changes  Conduction Disutrbances:none  Narrative Interpretation:  Old EKG Reviewed: unchanged   Assessment/Plan Active Problems:  Hypertension  CAD (coronary artery disease)  ESRD on dialysis  UTI (lower urinary tract  infection)  Constipation - functional  atrial fibrillation  Urinary tract infection likely causing toxic metabolic encephalopathy, patient has been started on ciprofloxacin he will receive  one dose, dosing to be adjusted for his dialysis  Altered mental status could be secondary to UTI, patient had a recent bout of diarrhea and nausea vomiting consistent with possible viral gastroenteritis,  Diarrhea if diarrhea recurs will check a C. difficile PCR  End-stage renal disease, notify nephrology the morning, next dialysis will be on Monday  Atrial fibrillation, patient on metoprolol for rate control, not on anticoagulation given recurrent falls   Code Status:   full, confirmed by the daughter Dois Davenport and [phone] also present by the bedside Family Communication: bedside Disposition Plan: admit , family does not want any placement at this time  Time spent: 70 mins   [facility] Pager [phone]  If 7PM-7AM, please contact night-coverage [email or web address] Password [email or web address] 01/01/2012, 8:14 PM

## 2012-01-01 NOTE — ED Notes (Signed)
Family at bedside. 

## 2012-01-01 NOTE — ED Notes (Signed)
Pt only received third of his dialysis treatment

## 2012-01-01 NOTE — Progress Notes (Signed)
Rechecked manual bp x2, 94/56 and 96/48. Pt is asymptomatic, sleeping but easily aroused, skin warm and dry. Per MD ok to hold bp meds. If pt HR increases, MD states ok to give metoprolol later. Will continue to monitor.

## 2012-01-01 NOTE — Progress Notes (Signed)
Pt has arrived to unit, oriented to RN, room, call bell. Pt resting comfortably. BP on arrival 95/58, paged MD regarding whether to hold or give bp meds. Will continue to monitor.

## 2012-01-01 NOTE — ED Notes (Signed)
Pt left side arm, leg pain, in addition to infiltrated fistula graft site on left arm.

## 2012-01-01 NOTE — ED Provider Notes (Signed)
History     CSN: [id number]  Arrival date & time 01/01/12  1419   First MD Initiated Contact with Patient 01/01/12 1516      Chief Complaint  Patient presents with  . Pain    (Consider location/radiation/quality/duration/timing/severity/associated sxs/prior treatment) HPI Comments: Patient is a 78 year old gentleman with multiple medical problems including end-stage renal disease on dialysis. For the last 3-4 days he's been having visual hallucinations which is new for him. His son also states that he has had multiple episodes of vomiting in the last week which is also different for him. He states he has not been eating or drinking well. Also the patient is complaining of a lot of lower extremity pain but he has no vascular disease and recently had vein mapping done of his legs.  Patient had a mechanical fall last night and has been complaining of left-sided hip and rib pain. Currently on exam patient is fatigued but he is able to answer questions. He also states that he has not had a normal bowel movement in over a week. However he denies abdominal pain. While patient was at dialysis today he started complaining of feeling" queasy."  Dialysis was concerned that he was dehydrated and did not look well. They sent him here for further evaluation  The history is provided by the patient, a relative and a caregiver.    Past Medical History  Diagnosis Date  . Hypertension   . Ulcer   . GERD (gastroesophageal reflux disease)   . CAD (coronary artery disease)     distal LAD occlusion  . Poor circulation   . ESRD on dialysis   . Hyperlipidemia   . Cancer   . Knee pain, right   . OSA (obstructive sleep apnea)   . PAD (peripheral artery disease)   . IDA (iron deficiency anemia)   . Hyperparathyroidism   . Arthritis     Past Surgical History  Procedure Date  . Back surgery   . Neck surgery   . Hernia repair     right  . Knee surgery   . Leg surgery   . Shoulder surgery   .  Cardiac catheterization 02/06/2005    EF 65-70%.  . Av fistula placement     LEFT ARM  . Cataract extraction, bilateral   . Cholecystectomy   . Transthoracic echocardiogram 04/04/2009    EF 60-65%  . Transthoracic echocardiogram 02/07/2008    EF 55-60%  . Cardiovascular stress test 06/01/2006    EF 55%. NORMAL LV FUNCTION    Family History  Problem Relation Age of Onset  . Stroke Mother   . Heart attack Father   . Stroke Brother   . Stroke Brother   . Stroke Brother     History  Substance Use Topics  . Smoking status: Never Smoker   . Smokeless tobacco: Never Used  . Alcohol Use: No      Review of Systems  Constitutional: Positive for appetite change. Negative for fever and chills.  Respiratory: Negative for cough and shortness of breath.   Cardiovascular: Positive for chest pain.       Since the fall last night pain in the left rib cage   Gastrointestinal: Positive for nausea, vomiting and constipation. Negative for abdominal pain and diarrhea.  Genitourinary: Negative for dysuria.  Psychiatric/Behavioral: Positive for hallucinations.  All other systems reviewed and are negative.    Allergies  Morphine and related; Penicillins; Ultram; Amoxicillin; Darvocet; Keflex; Neurontin; and Tramadol  Home  Medications   Current Outpatient Rx  Name Route Sig Dispense Refill  . ACETAMINOPHEN 325 MG PO TABS Oral Take 650 mg by mouth every 6 (six) hours as needed. For pain    . ASPIRIN 81 MG PO TABS Oral Take 81 mg by mouth daily.      Marland Kitchen CALCIUM ACETATE 667 MG PO CAPS Oral Take 1,334 mg by mouth 3 (three) times daily with meals.    Marland Kitchen FLUCONAZOLE 200 MG PO TABS Oral Take 200 mg by mouth daily. For 3 days, started on 12/29/11 last dose should be today    . ISOSORBIDE MONONITRATE ER 60 MG PO TB24 Oral Take 60 mg by mouth daily.      Marland Kitchen METOPROLOL TARTRATE 50 MG PO TABS Oral Take 50 mg by mouth 2 (two) times daily.      Marland Kitchen ONE-DAILY MULTI VITAMINS PO TABS Oral Take 1 tablet by  mouth daily.      . NYSTATIN 100000 UNIT/GM EX OINT Topical Apply 1 application topically 2 (two) times daily.    Marland Kitchen OMEPRAZOLE 20 MG PO CPDR Oral Take 20 mg by mouth 2 (two) times daily.      Marland Kitchen POLYETHYLENE GLYCOL 3350 PO PACK Oral Take 17 g by mouth daily.      Marland Kitchen SIMVASTATIN 20 MG PO TABS Oral Take 20 mg by mouth at bedtime.      . TRAMADOL HCL 50 MG PO TABS Oral Take 50 mg by mouth daily.    Marland Kitchen NITROGLYCERIN 0.4 MG SL SUBL Sublingual Place 1 tablet (0.4 mg total) under the tongue every 5 (five) minutes as needed. 25 tablet 11    BP 110/75  Pulse 100  Temp 97.6 F (36.4 C)  Ht 5\' 10"  (1.778 m)  SpO2 100%  Physical Exam  Nursing note and vitals reviewed. Constitutional: He is oriented to person, place, and time. He appears well-developed and well-nourished. No distress.       Fatigued but arousable  HENT:  Head: Normocephalic and atraumatic.  Mouth/Throat: Oropharynx is clear and moist.  Eyes: Conjunctivae normal and EOM are normal. Pupils are equal, round, and reactive to light.  Neck: Normal range of motion. Neck supple.  Cardiovascular: Normal rate, regular rhythm and intact distal pulses.   No murmur heard. Pulmonary/Chest: Effort normal and breath sounds normal. No respiratory distress. He has no wheezes. He has no rales.  Abdominal: Soft. He exhibits no distension. There is no tenderness. There is no rebound and no guarding.  Genitourinary:       Soft stool in the rectal vault  Musculoskeletal: Normal range of motion. He exhibits tenderness. He exhibits no edema.       Pt does not appear to be tender in the left hip.  Able to range without pain.  Graft in the left upper ext with normal strong pulse but ecchymosis around the puncture site.  Normal radial pulses bilaterally.  Multiple early necrotic lesions in the bilateral toes with cool feet and no palpable pulses. Mid foot bilaterally is warm.  Neurological: He is alert and oriented to person, place, and time.  Skin: Skin is  warm and dry. No rash noted. No erythema.  Psychiatric: He has a normal mood and affect. His behavior is normal.    ED Course  Procedures (including critical care time)  Labs Reviewed  URINALYSIS, ROUTINE W REFLEX MICROSCOPIC - Abnormal; Notable for the following:    Color, Urine AMBER (*)  BIOCHEMICALS MAY BE AFFECTED BY COLOR  APPearance CLOUDY (*)     Hgb urine dipstick LARGE (*)     Bilirubin Urine SMALL (*)     Protein, ur 100 (*)     Leukocytes, UA LARGE (*)     All other components within normal limits  URINE MICROSCOPIC-ADD ON - Abnormal; Notable for the following:    Squamous Epithelial / LPF MANY (*)     Bacteria, UA MANY (*)     Casts HYALINE CASTS (*)     All other components within normal limits  CBC WITH DIFFERENTIAL  COMPREHENSIVE METABOLIC PANEL  LIPASE, BLOOD  URINE CULTURE  TROPONIN I  TROPONIN I  TROPONIN I  LACTIC ACID, PLASMA   Dg Chest 2 View  01/01/2012  *RADIOLOGY REPORT*  Clinical Data: Left rib pain with fall.  CHEST - 2 VIEW  Comparison: 08/22/2010.  Findings: No contusion, effusion or pneumothorax.  No acute rib abnormalities.  Chronic deformities left sixth and seventh ribs. Elevated right hemidiaphragm.  The lungs are clear.  The heart and mediastinal structures are normal. Incidental note made of a fusion plate in the lower cervical spine and vascular stent in the region of the left axilla.  IMPRESSION: No evidence of acute trauma.  No active disease.   Original Report Authenticated By: Mervin Hack, M.D.    Dg Hip Complete Left  01/01/2012  *RADIOLOGY REPORT*  Clinical Data: Left hip pain post fall  LEFT HIP - COMPLETE 2+ VIEW  Comparison: Pelvic radiograph 01/21/2010  Findings: Prior lower lumbar fusion. Diffuse osseous demineralization. Symmetric hip and SI joints. No acute fracture, dislocation, or bone destruction. Extensive atherosclerotic calcification.  IMPRESSION: Osseous demineralization. No acute abnormalities.   Original Report  Authenticated By: Lollie Marrow, M.D.      Date: 01/01/2012  Rate: 102  Rhythm: atrial fibrillation  QRS Axis: normal  Intervals: a.fib  ST/T Wave abnormalities: nonspecific ST/T changes  Conduction Disutrbances:none  Narrative Interpretation:   Old EKG Reviewed: unchanged    1. UTI (lower urinary tract infection)   2. Dehydration       MDM   Patient with multiple medical problems including end-stage renal disease who is on dialysis who for the last few days has not been needing well and family states he is hallucinating. He fell last night and has been complaining of pain in the left ribs and left hip. While at dialysis patient began feeling very nauseated but cannot describe it any further. She denies nausea now but states he's had been unable to have a bowel movement for several days. His abdomen is soft and nontender he does have some soft stool in the rectal vault but no signs concerning for impaction. Patient is mildly tachycardic and does appear dehydrated. He does make urine and this concern for possible urinary tract infection as the cause for his hallucinations and decreased appetite. Family denies any new medications and patient has not missed any dialysis treatments. On exam he has no focal signs of weakness concerning for stroke.  Concern for a possible anginal equivalent causing his nausea today while on dialysis. Family also states he's been vomiting a lot recently which also could be an anginal equivalent versus urinary tract infection or other infectious cause.  Pt has known vascular disease in the lower ext with early necrosis and recently had vein mapping which may also be cause for the fall and pain however on exam no signs of infection. CBC, CMP, lipase, UA, troponin, lactic acid, EKG, chest x-ray pending.  Gwyneth Sprout, MD 01/01/12 [id number]

## 2012-01-02 DIAGNOSIS — N186 End stage renal disease: Secondary | ICD-10-CM

## 2012-01-02 DIAGNOSIS — K5909 Other constipation: Secondary | ICD-10-CM

## 2012-01-02 LAB — COMPREHENSIVE METABOLIC PANEL
AST: 43 U/L — ABNORMAL HIGH (ref 0–37)
Albumin: 2.7 g/dL — ABNORMAL LOW (ref 3.5–5.2)
Alkaline Phosphatase: 69 U/L (ref 39–117)
BUN: 35 mg/dL — ABNORMAL HIGH (ref 6–23)
Chloride: 100 mEq/L (ref 96–112)
Potassium: 4.5 mEq/L (ref 3.5–5.1)
Total Bilirubin: 0.5 mg/dL (ref 0.3–1.2)

## 2012-01-02 LAB — CBC
MCH: 32.6 pg (ref 26.0–34.0)
MCV: 98.8 fL (ref 78.0–100.0)
Platelets: 268 10*3/uL (ref 150–400)
RDW: 17 % — ABNORMAL HIGH (ref 11.5–15.5)

## 2012-01-02 LAB — TROPONIN I
Troponin I: <0.3 ng/mL (ref <0.30)
Troponin I: <0.3 ng/mL (ref <0.30)

## 2012-01-02 MED ORDER — SENNOSIDES-DOCUSATE SODIUM 8.6-50 MG PO TABS: 1.0000 | ORAL_TABLET | Freq: Two times a day (BID) | ORAL | Status: DC

## 2012-01-02 MED ORDER — NEPRO/CARBSTEADY PO LIQD
237.0000 mL | Freq: Every day | ORAL | Status: DC
  Administered 2012-01-02 – 2012-01-03 (×2): 237 mL via ORAL

## 2012-01-02 MED ORDER — NYSTATIN 100000 UNIT/GM EX OINT
TOPICAL_OINTMENT | Freq: Two times a day (BID) | CUTANEOUS | Status: DC
  Administered 2012-01-02 – 2012-01-04 (×5): via TOPICAL
  Filled 2012-01-02 (×2): qty 15

## 2012-01-02 NOTE — Progress Notes (Addendum)
[facility] PROGRESS NOTE  Gerald Tucker ZOX:[id number] DOB: 1918-07-02 DOA: 01/01/2012 PCP: Minda Meo, MD  Brief negative 78 year old male with history of end-stage renal disease on dialysis brought in by his daughter for her episodes of vomiting, and altered mental status with confusion this secondary to UTI.   Assessment/Plan Altered mental status Secondary to UTI. Patient started on ciprofloxacin renally dosed. Will follow up with urine culture. Patient also had recent nausea and vomiting possibly with UTI versus viral gastroenteritis. Informs being constipated for several days. -Patient mental status much improved at this time.    End-stage renal disease,  -Renal team informed. Hemodialysis on Monday.  Atrial fibrillation,  patient on metoprolol for rate control, not on anticoagulation given recurrent falls    bilateral peripheral artery disease Follow up as outpatient  History of sleep apnea Patient is on CPAP at home. Son will inform about the settings and will resume it here.  Yeast infection of the groin Recently completed a course of treatment. Of continue with nystatin cream. Continue Diflucan  Code Status: Full code Family Communication: Discussed plan with son at bedside Disposition Plan: Home once stable   Consultants:  [facility]  Procedures:  Hemodialysis inpatient  Antibiotics:  Ciprofloxacin started on 10/11  HPI/Subjective: Patient seen and examined this morning. He informs off being constipated for last 45 days. Denies any other symptoms. He is well oriented today.  Objective: Filed Vitals:   01/01/12 2330 01/02/12 0519 01/02/12 1007 01/02/12 1033  BP: 96/48 126/76 113/62   Pulse:  100 103 93  Temp:  98.5 F (36.9 C)  98.2 F (36.8 C)  TempSrc:  Oral  Oral  Resp:  20  18  Height:      Weight:      SpO2:  93%  95%    Intake/Output Summary (Last 24 hours) at 01/02/12 1132 Last data filed at 01/01/12 2233  Gross  per 24 hour  Intake    203 ml  Output      0 ml  Net    203 ml   Filed Weights   01/01/12 2143  Weight: 73.2 kg (161 lb 6 oz)    Exam:   General:  Elderly male lying in bed in no acute distress  HEENT: No pallor, no icterus, moist oral mucosa  Cardiovascular: Normal S1 and S2, no murmurs rub or gallop  Respiratory: Clear breath sounds bilaterally no added sounds  Abdomen: Soft, nontender nondistended, bowel sounds present  Extremities: Warm, no edema left UE graft, multiple necrotic lesions over right toe and absent pulses. Normal distal pulses over left lobe.  CNS: AAO x3, hard of hearing,  Data Reviewed: Basic Metabolic Panel:  Lab 01/02/12 8119 01/01/12 1633  NA 140 138  K 4.5 3.8  CL 100 95*  CO2 27 29  GLUCOSE 69* 87  BUN 35* 29*  CREATININE 5.40* 4.50*  CALCIUM 9.2 9.4  MG -- --  PHOS -- --   Liver Function Tests:  Lab 01/02/12 0315 01/01/12 1633  AST 43* 46*  ALT 27 31  ALKPHOS 69 73  BILITOT 0.5 0.6  PROT 6.1 6.5  ALBUMIN 2.7* 2.9*    Lab 01/01/12 1633  LIPASE 54  AMYLASE --   No results found for this basename: AMMONIA:5 in the last 168 hours CBC:  Lab 01/02/12 0315 01/01/12 1633  WBC 8.7 10.9*  NEUTROABS -- 8.6*  HGB 10.5* 11.0*  HCT 31.8* 33.4*  MCV 98.8 97.9  PLT 268 285  Cardiac Enzymes:  Lab 01/02/12 0851 01/02/12 0315 01/01/12 2139 01/01/12 1650  CKTOTAL -- -- -- --  CKMB -- -- -- --  CKMBINDEX -- -- -- --  TROPONINI <0.30 <0.30 <0.30 <0.30   BNP (last 3 results) No results found for this basename: PROBNP:3 in the last 8760 hours CBG: No results found for this basename: GLUCAP:5 in the last 168 hours  Recent Results (from the past 240 hour(s))  MRSA PCR SCREENING     Status: Normal   Collection Time   01/01/12 10:10 PM      Component Value Range Status Comment   MRSA by PCR NEGATIVE  NEGATIVE Final      Studies: Dg Chest 2 View  01/01/2012  *RADIOLOGY REPORT*  Clinical Data: Left rib pain with fall.  CHEST - 2  VIEW  Comparison: 08/22/2010.  Findings: No contusion, effusion or pneumothorax.  No acute rib abnormalities.  Chronic deformities left sixth and seventh ribs. Elevated right hemidiaphragm.  The lungs are clear.  The heart and mediastinal structures are normal. Incidental note made of a fusion plate in the lower cervical spine and vascular stent in the region of the left axilla.  IMPRESSION: No evidence of acute trauma.  No active disease.   Original Report Authenticated By: Mervin Hack, M.D.    Dg Hip Complete Left  01/01/2012  *RADIOLOGY REPORT*  Clinical Data: Left hip pain post fall  LEFT HIP - COMPLETE 2+ VIEW  Comparison: Pelvic radiograph 01/21/2010  Findings: Prior lower lumbar fusion. Diffuse osseous demineralization. Symmetric hip and SI joints. No acute fracture, dislocation, or bone destruction. Extensive atherosclerotic calcification.  IMPRESSION: Osseous demineralization. No acute abnormalities.   Original Report Authenticated By: Lollie Marrow, M.D.     Scheduled Meds:   . aspirin  81 mg Oral Daily  . calcium acetate  1,334 mg Oral TID WC  . ciprofloxacin  400 mg Intravenous Once  . fluconazole  200 mg Oral Daily  . isosorbide mononitrate  60 mg Oral Daily  . metoprolol  50 mg Oral BID  . pantoprazole  40 mg Oral Q1200  . simvastatin  20 mg Oral QHS  . sodium chloride  500 mL Intravenous Once  . sodium chloride  3 mL Intravenous Q12H  . traMADol  50 mg Oral Daily  . DISCONTD: enoxaparin (LOVENOX) injection  30 mg Subcutaneous Q24H   Continuous Infusions:     Time spent: 25 minutes    [name]  [facility] Pager [phone] If 8PM-8AM, please contact night-coverage at [email or web address], password [facility] 01/02/2012, 11:32 AM  LOS: 1 day

## 2012-01-02 NOTE — Progress Notes (Signed)
INITIAL ADULT NUTRITION ASSESSMENT Date: 01/02/2012   Time: 9:42 AM Reason for Assessment: MST  INTERVENTION: Nepro Shake po daily, each supplement provides 425 kcal and 19 grams protein.   DOCUMENTATION CODES Per approved criteria  -Not Applicable    ASSESSMENT: Male 78 y.o.  Dx: Altered Mental Status  Hx:  Past Medical History  Diagnosis Date  . Hypertension   . Ulcer   . GERD (gastroesophageal reflux disease)   . CAD (coronary artery disease)     distal LAD occlusion  . Poor circulation   . ESRD on dialysis   . Hyperlipidemia   . Cancer   . Knee pain, right   . OSA (obstructive sleep apnea)   . PAD (peripheral artery disease)   . IDA (iron deficiency anemia)   . Hyperparathyroidism   . Arthritis    Past Surgical History  Procedure Date  . Back surgery   . Neck surgery   . Hernia repair     right  . Knee surgery   . Leg surgery   . Shoulder surgery   . Cardiac catheterization 02/06/2005    EF 65-70%.  . Av fistula placement     LEFT ARM  . Cataract extraction, bilateral   . Cholecystectomy   . Transthoracic echocardiogram 04/04/2009    EF 60-65%  . Transthoracic echocardiogram 02/07/2008    EF 55-60%  . Cardiovascular stress test 06/01/2006    EF 55%. NORMAL LV FUNCTION    Related Meds:  Scheduled Meds:   . aspirin  81 mg Oral Daily  . calcium acetate  1,334 mg Oral TID WC  . ciprofloxacin  400 mg Intravenous Once  . fluconazole  200 mg Oral Daily  . isosorbide mononitrate  60 mg Oral Daily  . metoprolol  50 mg Oral BID  . pantoprazole  40 mg Oral Q1200  . simvastatin  20 mg Oral QHS  . sodium chloride  500 mL Intravenous Once  . sodium chloride  3 mL Intravenous Q12H  . traMADol  50 mg Oral Daily  . DISCONTD: enoxaparin (LOVENOX) injection  30 mg Subcutaneous Q24H   Continuous Infusions:  PRN Meds:.acetaminophen, acetaminophen, bisacodyl, levalbuterol, ondansetron (ZOFRAN) IV, ondansetron  Ht: 5\' 8"  (172.7 cm)  Wt: 161 lb 6 oz (73.2  kg)  Ideal Wt: 70 kg % Ideal Wt: 105%  Usual Wt:  Wt Readings from Last 10 Encounters:  01/01/12 161 lb 6 oz (73.2 kg)  01/13/11 158 lb 6.4 oz (71.85 kg)   % Usual Wt: 102%  Body mass index is 24.54 kg/(m^2). WNL   Food/Nutrition Related Hx: No issues PTA per pt.   Labs:  CMP     Component Value Date/Time   NA 140 01/02/2012 0315   K 4.5 01/02/2012 0315   CL 100 01/02/2012 0315   CO2 27 01/02/2012 0315   GLUCOSE 69* 01/02/2012 0315   BUN 35* 01/02/2012 0315   CREATININE 5.40* 01/02/2012 0315   CALCIUM 9.2 01/02/2012 0315   CALCIUM 9.1 04/01/2009 2202   PROT 6.1 01/02/2012 0315   ALBUMIN 2.7* 01/02/2012 0315   AST 43* 01/02/2012 0315   ALT 27 01/02/2012 0315   ALKPHOS 69 01/02/2012 0315   BILITOT 0.5 01/02/2012 0315   GFRNONAA 8* 01/02/2012 0315   GFRAA 10* 01/02/2012 0315  CBG (last 3)  No results found for this basename: GLUCAP:3 in the last 72 hours Sodium  Date/Time Value Range Status  01/02/2012  3:15 AM 140  135 - 145 mEq/L Final  01/01/2012  4:33 PM 138  135 - 145 mEq/L Final  08/27/2011  1:45 PM 138  135 - 145 mEq/L Final    Potassium  Date/Time Value Range Status  01/02/2012  3:15 AM 4.5  3.5 - 5.1 mEq/L Final  01/01/2012  4:33 PM 3.8  3.5 - 5.1 mEq/L Final  08/27/2011  1:45 PM 5.2* 3.5 - 5.1 mEq/L Final    Phosphorus  Date/Time Value Range Status  08/25/2010  9:34 AM 6.1* 2.3 - 4.6 mg/dL Final  11/26/6211  0:86 AM 6.2* 2.3 - 4.6 mg/dL Final  5/78/4696  2:95 AM 6.5* 2.3 - 4.6 mg/dL Final    No results found for this basename: mg    Intake/Output Summary (Last 24 hours) at 01/02/12 0947 Last data filed at 01/01/12 2233  Gross per 24 hour  Intake    203 ml  Output      0 ml  Net    203 ml   Last BM: 01/01/12 per documentation  Diet Order: Renal [id number]  Supplements/Tube Feeding: none  IVF:    Pt admitted with Altered Mental Status. Pt has had a recent scrotal infection and completed 2 courses of antibiotics. Per family pt has had bouts  of N/V/D PTA.  No family present. Pt alert, awake and did not seem confused. Per pt he has had no N/V/D but does report constipation of >/= 5 days. Pt reports no weight loss or decrease in appetite.   Spoke with RN who confirms unclear hx.  Pt reports that he ate >50% of his Breakfast. States he has never had any supplements as he has not needed them.   Estimated Nutritional Needs:   Kcal:  2000-2200 Protein:  80-95 grams Fluid:  1.2 L  NUTRITION DIAGNOSIS: -Increased nutrient needs r/t HD treatments AEB estimated needs.  MONITORING/EVALUATION(Goals): Goal: Pt to meet >/= 90% of their estimated nutrition needs. Monitor: PO intake, weight, supplement acceptance  EDUCATION NEEDS: -No education needs identified at this time  Kendell Bane RD, LDN, CNSC [phone] Weekend/After Hours Pager  01/02/2012, 9:42 AM

## 2012-01-02 NOTE — Consult Note (Signed)
[name] Renal Consultation Note  Indication for Consultation:  Management of ESRD/hemodialysis; anemia, hypertension/volume and secondary hyperparathyroidism  HPI: Gerald Tucker is a 78 y.o. male with ESRD on dialysis on MWF at the [facility] who presented to the ED yesterday from the center for weakness and confusion.  He arrived at the dialysis center yesterday morning with complaints of left hip and rib pain after a mechanical fall and became increasingly confused. The charge nurse reported that he appeared dehydrated and very weak, so his dialysis was stopped after 1 1/2 hours of his four-hour treatment.  Chest x-ray showed no evidence of acute trauma and clear lungs, but urinalysis indicated UTI.  His family reported several episodes of vomiting during the last week.  He frequently complains of lower extremity pain and recently had vein mapping/ABI suggesting bilateral peripheral vascular disease. Currently he is feeling better and complains only of constipation.  Dialysis Orders: Center: [place] on MWF. EDW 73 kg  HD Bath 2K/2Ca  Time 3 hrs 45 mins Heparin 3000 U bolus, 2000 U mid-Tx. Access AVF @ LUA  BFR 400 DFR A1.5    Zemplar 4 mcg IV/HD   Epogen 0 Units IV/HD  Venofer 100 mg on Wed.   Past Medical History  Diagnosis Date  . Hypertension   . Ulcer   . GERD (gastroesophageal reflux disease)   . CAD (coronary artery disease)     distal LAD occlusion  . Poor circulation   . ESRD on dialysis   . Hyperlipidemia   . Cancer   . Knee pain, right   . OSA (obstructive sleep apnea)   . PAD (peripheral artery disease)   . IDA (iron deficiency anemia)   . Hyperparathyroidism   . Arthritis    Past Surgical History  Procedure Date  . Back surgery   . Neck surgery   . Hernia repair     right  . Knee surgery   . Leg surgery   . Shoulder surgery   . Cardiac catheterization 02/06/2005    EF 65-70%.  . Av fistula placement     LEFT ARM  . Cataract  extraction, bilateral   . Cholecystectomy   . Transthoracic echocardiogram 04/04/2009    EF 60-65%  . Transthoracic echocardiogram 02/07/2008    EF 55-60%  . Cardiovascular stress test 06/01/2006    EF 55%. NORMAL LV FUNCTION   Family History  Problem Relation Age of Onset  . Stroke Mother   . Heart attack Father   . Stroke Brother   . Stroke Brother   . Stroke Brother    Social History She reports that he has never smoked. He has never used smokeless tobacco. He reports that he does not drink alcohol. His drug history not on file.  Allergies  Allergen Reactions  . Morphine And Related   . Penicillins   . Ultram (Tramadol Hcl)   . Amoxicillin   . Darvocet (Propoxyphene-Acetaminophen)   . Keflex (Cephalexin)   . Neurontin (Gabapentin)   . Tramadol    Prior to Admission medications   Medication Sig Start Date End Date Taking? Authorizing Provider  acetaminophen (TYLENOL) 325 MG tablet Take 650 mg by mouth every 6 (six) hours as needed. For pain   Yes Historical Provider, MD  aspirin 81 MG tablet Take 81 mg by mouth daily.     Yes Historical Provider, MD  calcium acetate (PHOSLO) 667 MG capsule Take 1,334 mg by mouth 3 (three) times daily  with meals.   Yes Historical Provider, MD  fluconazole (DIFLUCAN) 200 MG tablet Take 200 mg by mouth daily. For 3 days, started on 12/29/11 last dose should be today   Yes Historical Provider, MD  isosorbide mononitrate (IMDUR) 60 MG 24 hr tablet Take 60 mg by mouth daily.     Yes Historical Provider, MD  metoprolol (LOPRESSOR) 50 MG tablet Take 50 mg by mouth 2 (two) times daily.     Yes Historical Provider, MD  Multiple Vitamin (MULTIVITAMIN) tablet Take 1 tablet by mouth daily.     Yes Historical Provider, MD  nystatin ointment (MYCOSTATIN) Apply 1 application topically 2 (two) times daily.   Yes Historical Provider, MD  omeprazole (PRILOSEC) 20 MG capsule Take 20 mg by mouth 2 (two) times daily.     Yes Historical Provider, MD  polyethylene  glycol (MIRALAX / GLYCOLAX) packet Take 17 g by mouth daily.     Yes Historical Provider, MD  simvastatin (ZOCOR) 20 MG tablet Take 20 mg by mouth at bedtime.     Yes Historical Provider, MD  traMADol (ULTRAM) 50 MG tablet Take 50 mg by mouth daily.   Yes Historical Provider, MD  nitroGLYCERIN (NITROSTAT) 0.4 MG SL tablet Place 1 tablet (0.4 mg total) under the tongue every 5 (five) minutes as needed. 03/12/11   Peter M [place], MD   Labs:  Results for orders placed during the hospital encounter of 01/01/12 (from the past 48 hour(s))  URINALYSIS, ROUTINE W REFLEX MICROSCOPIC     Status: Abnormal   Collection Time   01/01/12  4:23 PM      Component Value Range Comment   Color, Urine AMBER (*) YELLOW BIOCHEMICALS MAY BE AFFECTED BY COLOR   APPearance CLOUDY (*) CLEAR    Specific Gravity, Urine 1.018  1.005 - 1.030    pH 7.0  5.0 - 8.0    Glucose, UA NEGATIVE  NEGATIVE mg/dL    Hgb urine dipstick LARGE (*) NEGATIVE    Bilirubin Urine SMALL (*) NEGATIVE    Ketones, ur NEGATIVE  NEGATIVE mg/dL    Protein, ur 161 (*) NEGATIVE mg/dL    Urobilinogen, UA 0.2  0.0 - 1.0 mg/dL    Nitrite NEGATIVE  NEGATIVE    Leukocytes, UA LARGE (*) NEGATIVE   URINE MICROSCOPIC-ADD ON     Status: Abnormal   Collection Time   01/01/12  4:23 PM      Component Value Range Comment   Squamous Epithelial / LPF MANY (*) RARE    WBC, UA TOO NUMEROUS TO COUNT  <3 WBC/hpf    RBC / HPF 21-50  <3 RBC/hpf    Bacteria, UA MANY (*) RARE    Casts HYALINE CASTS (*) NEGATIVE    Urine-Other LESS THAN 10 mL OF URINE SUBMITTED     CBC WITH DIFFERENTIAL     Status: Abnormal   Collection Time   01/01/12  4:33 PM      Component Value Range Comment   WBC 10.9 (*) 4.0 - 10.5 K/uL    RBC 3.41 (*) 4.22 - 5.81 MIL/uL    Hemoglobin 11.0 (*) 13.0 - 17.0 g/dL    HCT 09.6 (*) 04.5 - 52.0 %    MCV 97.9  78.0 - 100.0 fL    MCH 32.3  26.0 - 34.0 pg    MCHC 32.9  30.0 - 36.0 g/dL    RDW 40.9 (*) 81.1 - 15.5 %    Platelets 285  150 -  400 K/uL    Neutrophils Relative 79 (*) 43 - 77 %    Neutro Abs 8.6 (*) 1.7 - 7.7 K/uL    Lymphocytes Relative 12  12 - 46 %    Lymphs Abs 1.3  0.7 - 4.0 K/uL    Monocytes Relative 7  3 - 12 %    Monocytes Absolute 0.7  0.1 - 1.0 K/uL    Eosinophils Relative 2  0 - 5 %    Eosinophils Absolute 0.2  0.0 - 0.7 K/uL    Basophils Relative 1  0 - 1 %    Basophils Absolute 0.1  0.0 - 0.1 K/uL   COMPREHENSIVE METABOLIC PANEL     Status: Abnormal   Collection Time   01/01/12  4:33 PM      Component Value Range Comment   Sodium 138  135 - 145 mEq/L    Potassium 3.8  3.5 - 5.1 mEq/L    Chloride 95 (*) 96 - 112 mEq/L    CO2 29  19 - 32 mEq/L    Glucose, Bld 87  70 - 99 mg/dL    BUN 29 (*) 6 - 23 mg/dL    Creatinine, Ser 1.61 (*) 0.50 - 1.35 mg/dL    Calcium 9.4  8.4 - 09.6 mg/dL    Total Protein 6.5  6.0 - 8.3 g/dL    Albumin 2.9 (*) 3.5 - 5.2 g/dL    AST 46 (*) 0 - 37 U/L    ALT 31  0 - 53 U/L    Alkaline Phosphatase 73  39 - 117 U/L    Total Bilirubin 0.6  0.3 - 1.2 mg/dL    GFR calc non Af Amer 10 (*) >90 mL/min    GFR calc Af Amer 12 (*) >90 mL/min   LIPASE, BLOOD     Status: Normal   Collection Time   01/01/12  4:33 PM      Component Value Range Comment   Lipase 54  11 - 59 U/L   LACTIC ACID, PLASMA     Status: Normal   Collection Time   01/01/12  4:33 PM      Component Value Range Comment   Lactic Acid, Venous 1.2  0.5 - 2.2 mmol/L   TROPONIN I     Status: Normal   Collection Time   01/01/12  4:50 PM      Component Value Range Comment   Troponin I <0.30  <0.30 ng/mL   TSH     Status: Normal   Collection Time   01/01/12  9:38 PM      Component Value Range Comment   TSH 1.296  0.350 - 4.500 uIU/mL   TROPONIN I     Status: Normal   Collection Time   01/01/12  9:39 PM      Component Value Range Comment   Troponin I <0.30  <0.30 ng/mL   MRSA PCR SCREENING     Status: Normal   Collection Time   01/01/12 10:10 PM      Component Value Range Comment   MRSA by PCR NEGATIVE   NEGATIVE   TROPONIN I     Status: Normal   Collection Time   01/02/12  3:15 AM      Component Value Range Comment   Troponin I <0.30  <0.30 ng/mL   COMPREHENSIVE METABOLIC PANEL     Status: Abnormal   Collection Time   01/02/12  3:15 AM  Component Value Range Comment   Sodium 140  135 - 145 mEq/L    Potassium 4.5  3.5 - 5.1 mEq/L    Chloride 100  96 - 112 mEq/L    CO2 27  19 - 32 mEq/L    Glucose, Bld 69 (*) 70 - 99 mg/dL    BUN 35 (*) 6 - 23 mg/dL    Creatinine, Ser 1.61 (*) 0.50 - 1.35 mg/dL    Calcium 9.2  8.4 - 09.6 mg/dL    Total Protein 6.1  6.0 - 8.3 g/dL    Albumin 2.7 (*) 3.5 - 5.2 g/dL    AST 43 (*) 0 - 37 U/L    ALT 27  0 - 53 U/L    Alkaline Phosphatase 69  39 - 117 U/L    Total Bilirubin 0.5  0.3 - 1.2 mg/dL    GFR calc non Af Amer 8 (*) >90 mL/min    GFR calc Af Amer 10 (*) >90 mL/min   CBC     Status: Abnormal   Collection Time   01/02/12  3:15 AM      Component Value Range Comment   WBC 8.7  4.0 - 10.5 K/uL    RBC 3.22 (*) 4.22 - 5.81 MIL/uL    Hemoglobin 10.5 (*) 13.0 - 17.0 g/dL    HCT 04.5 (*) 40.9 - 52.0 %    MCV 98.8  78.0 - 100.0 fL    MCH 32.6  26.0 - 34.0 pg    MCHC 33.0  30.0 - 36.0 g/dL    RDW 81.1 (*) 91.4 - 15.5 %    Platelets 268  150 - 400 K/uL   TROPONIN I     Status: Normal   Collection Time   01/02/12  8:51 AM      Component Value Range Comment   Troponin I <0.30  <0.30 ng/mL    Constitutional: negative for chills, fatigue, fevers and sweats Ears, nose, mouth, throat, and face: negative for hearing loss, hoarseness, nasal congestion and sore throat Respiratory: negative for cough, dyspnea on exertion, hemoptysis, sputum and wheezing Cardiovascular: negative for chest pain, dyspnea, orthopnea and palpitations Gastrointestinal: positive for constipation; negative for abdominal pain, nausea and vomiting Genitourinary:negative, oliguric Musculoskeletal:negative for arthralgias, back pain and neck pain Neurological: negative for  dizziness, headaches and speech problems  Physical Exam: Filed Vitals:   01/02/12 1033  BP:   Pulse: 93  Temp: 98.2 F (36.8 C)  Resp: 18     General appearance: somnolent, but able to arouse, in no apparent distress Head: Normocephalic, without obvious abnormality, atraumatic Neck: no adenopathy, no carotid bruit, no JVD and supple, symmetrical, trachea midline Resp: clear to auscultation bilaterally Cardio: regular rate and rhythm, S1, S2 normal, no murmur, click, rub or gallop GI: soft, non-tender; bowel sounds normal; no masses,  no organomegaly Extremities: No edema bilaterally Neurologic: Grossly normal Dialysis Access: AVF @ LUA with + bruit   Assessment/Plan: 1. AMS - secondary to UTI, started on Cipro, mental status improving; urine culture pending. 2. ESRD -  HD on MWF @ [place]; K 3.8 today s/p 1 1/2 hrs of 4-hr Tx yesterday.  Stable for next HD on 10/14. 3. Hypertension/volume  - BP 113/62 on metoprolol 50 mg bid; current wt 73.2 kg with EDW 73 kg; lungs clear per chest x-ray. 4. Anemia  - Hgb 10.5, no outpatient Epogen, Venofer 100 mg on Wed. 5. Metabolic bone disease - Ca 9.2 (10.2 corrected), last P, on  Zemplar 4 mcg, Phoslo 2 with meals. 6. Nutrition - Alb 2.7. 7. Yeast infection - on Diflucan and nystatin ointment for groin rash. 8. Hx A-fib - on Metoprolol, not a candidate for Coumadin secondary to fall risk. 9. PVD - not a candidate for revascularization.  LYLES,CHARLES 01/02/2012, 2:00 PM   Attending Nephrologist: Delano Metz, MD  Patient seen and examined and agree with assessment and plan as above.  Vinson Moselle  MD [employer] [phone] pgr    [phone] cell 01/02/2012, 5:00 PM

## 2012-01-03 LAB — URINE CULTURE

## 2012-01-03 MED ORDER — HEPARIN SODIUM (PORCINE) 1000 UNIT/ML DIALYSIS
20.0000 [IU]/kg | INTRAMUSCULAR | Status: DC | PRN
  Administered 2012-01-04: 1500 [IU] via INTRAVENOUS_CENTRAL
  Filled 2012-01-03: qty 2

## 2012-01-03 MED ORDER — CIPROFLOXACIN IN D5W 400 MG/200ML IV SOLN
400.0000 mg | INTRAVENOUS | Status: DC
  Administered 2012-01-03: 400 mg via INTRAVENOUS
  Filled 2012-01-03 (×2): qty 200

## 2012-01-03 MED ORDER — POLYETHYLENE GLYCOL 3350 17 G PO PACK
17.0000 g | PACK | Freq: Two times a day (BID) | ORAL | Status: DC
  Administered 2012-01-03 – 2012-01-04 (×2): 17 g via ORAL
  Filled 2012-01-03 (×4): qty 1

## 2012-01-03 NOTE — Progress Notes (Signed)
  ANTIBIOTIC CONSULT NOTE - INITIAL  Pharmacy Consult for Cipro Indication: UTI  Allergies  Allergen Reactions  . Morphine And Related   . Penicillins   . Ultram (Tramadol Hcl)   . Amoxicillin   . Darvocet (Propoxyphene-Acetaminophen)   . Keflex (Cephalexin)   . Neurontin (Gabapentin)   . Tramadol     Patient Measurements: Height: 5\' 8"  (172.7 cm) Weight: 163 lb 2.3 oz (74 kg) IBW/kg (Calculated) : 68.4  Adjusted Body Weight:   Vital Signs: Temp: 98 F (36.7 C) (10/13 1330) Temp src: Oral (10/13 1330) BP: 116/54 mmHg (10/13 1330) Pulse Rate: 80  (10/13 1330) Intake/Output from previous day: 10/12 0701 - 10/13 0700 In: 600 [P.O.:600] Out: -  Intake/Output from this shift: Total I/O In: 240 [P.O.:240] Out: -   Labs:  Basename 01/02/12 0315 01/01/12 1633  WBC 8.7 10.9*  HGB 10.5* 11.0*  PLT 268 285  LABCREA -- --  CREATININE 5.40* 4.50*   Estimated Creatinine Clearance: 8.6 ml/min (by C-G formula based on Cr of 5.4). No results found for this basename: VANCOTROUGH:2,VANCOPEAK:2,VANCORANDOM:2,GENTTROUGH:2,GENTPEAK:2,GENTRANDOM:2,TOBRATROUGH:2,TOBRAPEAK:2,TOBRARND:2,AMIKACINPEAK:2,AMIKACINTROU:2,AMIKACIN:2, in the last 72 hours   Microbiology: Recent Results (from the past 720 hour(s))  URINE CULTURE     Status: Normal   Collection Time   01/01/12  4:23 PM      Component Value Range Status Comment   Specimen Description URINE, CLEAN CATCH   Final    Special Requests NONE   Final    Culture  Setup Time 01/01/2012 17:02   Final    Colony Count >=100,000 COLONIES/ML   Final    Culture     Final    Value: Multiple bacterial morphotypes present, none predominant. Suggest appropriate recollection if clinically indicated.   Report Status 01/03/2012 FINAL   Final   MRSA PCR SCREENING     Status: Normal   Collection Time   01/01/12 10:10 PM      Component Value Range Status Comment   MRSA by PCR NEGATIVE  NEGATIVE Final     Medical History: Past Medical  History  Diagnosis Date  . Hypertension   . Ulcer   . GERD (gastroesophageal reflux disease)   . CAD (coronary artery disease)     distal LAD occlusion  . Poor circulation   . ESRD on dialysis   . Hyperlipidemia   . Cancer   . Knee pain, right   . OSA (obstructive sleep apnea)   . PAD (peripheral artery disease)   . IDA (iron deficiency anemia)   . Hyperparathyroidism   . Arthritis    Assessment: 89yom admitted for AMS to continue Cipro for UTI. Patient received Cipro 400mg  x 1 on 10/11 but medication was never reordered. Patient has ESRD and receives HD on MWF. - Afebrile - WBC 8.7 - See culture above  Plan:  1. Cipro 400mg  IV q24h 2. Monitor renal plans, clinical course and cultures  Gerald Tucker [phone] 01/03/2012,3:36 PM

## 2012-01-03 NOTE — Progress Notes (Signed)
[facility] PROGRESS NOTE  NABEEL KUCHER NFA:[id number] DOB: [birth year] DOA: 01/01/2012 PCP: Minda Meo, MD  Brief negative  78 year old male with history of end-stage renal disease on dialysis brought in by his daughter for her episodes of vomiting, and altered mental status with confusion this secondary to UTI.   Assessment/Plan  Altered mental status  Secondary to UTI. Patient started on ciprofloxacin renally dosed. Pending urine culture. Patient also had recent nausea and vomiting possibly with UTI versus viral gastroenteritis. Patient informs being constipated and had a small bowel movement today.-Patient mental status back to baseline at this time. If continues to improve we'll discharge home in the morning.   End-stage renal disease,  Renal team following hemodialysis tomorrow.  Atrial fibrillation,  patient on metoprolol for rate control, not on anticoagulation given recurrent falls   bilateral peripheral artery disease  Follow up as outpatient   History of sleep apnea  Patient is on CPAP at home. Resume  Yeast infection of the groin  Continue Diflucan and nystatin cream. Clinically improved     Code Status: Full code Family Communication: None at bedside today Disposition Plan: Home likely in the morning   Consultants:  [facility]  Procedures:  None  Antibiotics:  Ciprofloxacin started on 10/11  HPI/Subjective: Patient seen and examined this morning. Informs having a small bowel movement today and feels better.  Objective: Filed Vitals:   01/02/12 1826 01/02/12 2144 01/02/12 2205 01/03/12 0516  BP: 103/63 97/57 98/48  106/44  Pulse: 72 88 76 88  Temp: 97.6 F (36.4 C)  98.5 F (36.9 C) 98.7 F (37.1 C)  TempSrc: Oral  Oral Oral  Resp: 18  18 18   Height:      Weight:   74 kg (163 lb 2.3 oz)   SpO2: 98%  98% 94%    Intake/Output Summary (Last 24 hours) at 01/03/12 1018 Last data filed at 01/03/12 0400  Gross per 24 hour    Intake    600 ml  Output      0 ml  Net    600 ml   Filed Weights   01/01/12 2143 01/02/12 2205  Weight: 73.2 kg (161 lb 6 oz) 74 kg (163 lb 2.3 oz)    Exam:  General: Elderly male lying in bed in no acute distress  HEENT: No pallor, no icterus, moist oral mucosa  Cardiovascular: Normal S1 and S2, no murmurs rub or gallop  Respiratory: Clear breath sounds bilaterally no added sounds  Abdomen: Soft, nontender nondistended, bowel sounds present , fungal rash over the groin appears improved Extremities: Warm, no edema left UE graft, multiple necrotic lesions over right toe and absent pulses. Normal distal pulses over left lobe.  CNS: AAO x3, hard of hearing,   Data Reviewed: Basic Metabolic Panel:  Lab 01/02/12 4696 01/01/12 1633  NA 140 138  K 4.5 3.8  CL 100 95*  CO2 27 29  GLUCOSE 69* 87  BUN 35* 29*  CREATININE 5.40* 4.50*  CALCIUM 9.2 9.4  MG -- --  PHOS -- --   Liver Function Tests:  Lab 01/02/12 0315 01/01/12 1633  AST 43* 46*  ALT 27 31  ALKPHOS 69 73  BILITOT 0.5 0.6  PROT 6.1 6.5  ALBUMIN 2.7* 2.9*    Lab 01/01/12 1633  LIPASE 54  AMYLASE --   No results found for this basename: AMMONIA:5 in the last 168 hours CBC:  Lab 01/02/12 0315 01/01/12 1633  WBC 8.7 10.9*  NEUTROABS -- 8.6*  HGB 10.5* 11.0*  HCT 31.8* 33.4*  MCV 98.8 97.9  PLT 268 285   Cardiac Enzymes:  Lab 01/02/12 0851 01/02/12 0315 01/01/12 2139 01/01/12 1650  CKTOTAL -- -- -- --  CKMB -- -- -- --  CKMBINDEX -- -- -- --  TROPONINI <0.30 <0.30 <0.30 <0.30   BNP (last 3 results) No results found for this basename: PROBNP:3 in the last 8760 hours CBG: No results found for this basename: GLUCAP:5 in the last 168 hours  Recent Results (from the past 240 hour(s))  MRSA PCR SCREENING     Status: Normal   Collection Time   01/01/12 10:10 PM      Component Value Range Status Comment   MRSA by PCR NEGATIVE  NEGATIVE Final      Studies: Dg Chest 2 View  01/01/2012   *RADIOLOGY REPORT*  Clinical Data: Left rib pain with fall.  CHEST - 2 VIEW  Comparison: 08/22/2010.  Findings: No contusion, effusion or pneumothorax.  No acute rib abnormalities.  Chronic deformities left sixth and seventh ribs. Elevated right hemidiaphragm.  The lungs are clear.  The heart and mediastinal structures are normal. Incidental note made of a fusion plate in the lower cervical spine and vascular stent in the region of the left axilla.  IMPRESSION: No evidence of acute trauma.  No active disease.   Original Report Authenticated By: Mervin Hack, M.D.    Dg Hip Complete Left  01/01/2012  *RADIOLOGY REPORT*  Clinical Data: Left hip pain post fall  LEFT HIP - COMPLETE 2+ VIEW  Comparison: Pelvic radiograph 01/21/2010  Findings: Prior lower lumbar fusion. Diffuse osseous demineralization. Symmetric hip and SI joints. No acute fracture, dislocation, or bone destruction. Extensive atherosclerotic calcification.  IMPRESSION: Osseous demineralization. No acute abnormalities.   Original Report Authenticated By: Lollie Marrow, M.D.     Scheduled Meds:   . aspirin  81 mg Oral Daily  . calcium acetate  1,334 mg Oral TID WC  . feeding supplement (NEPRO CARB STEADY)  237 mL Oral Daily  . fluconazole  200 mg Oral Daily  . isosorbide mononitrate  60 mg Oral Daily  . metoprolol  50 mg Oral BID  . nystatin ointment   Topical BID  . pantoprazole  40 mg Oral Q1200  . polyethylene glycol  17 g Oral BID  . simvastatin  20 mg Oral QHS  . sodium chloride  3 mL Intravenous Q12H  . traMADol  50 mg Oral Daily  . DISCONTD: senna-docusate  1 tablet Oral BID   Continuous Infusions:     Time spent: 25 minutes    Daleigh Pollinger  [facility] Pager [phone] If 8PM-8AM, please contact night-coverage at [email or web address], password [facility] 01/03/2012, 10:18 AM  LOS: 2 days

## 2012-01-03 NOTE — Progress Notes (Signed)
Subjective:   Still with constipation, although bowel movement documented on Fri 10/11; no other complaints.  Objective: Vital signs in last 24 hours: Temp:  [97.6 F (36.4 C)-98.7 F (37.1 C)] 98.7 F (37.1 C) (10/13 0516) Pulse Rate:  [72-103] 88  (10/13 0516) Resp:  [18] 18  (10/13 0516) BP: (97-113)/(44-63) 106/44 mmHg (10/13 0516) SpO2:  [94 %-98 %] 94 % (10/13 0516) Weight:  [74 kg (163 lb 2.3 oz)] 74 kg (163 lb 2.3 oz) (10/12 2205) Weight change: 0.8 kg (1 lb 12.2 oz)  Intake/Output from previous day: 10/12 0701 - 10/13 0700 In: 360 [P.O.:360] Out: -    EXAM: General appearance:  Alert, in no apparent distress Resp:  CTA without rales, rhonchi, or wheezes Cardio:  RRR without murmur or rub GI: + BS, soft and nontender Extremities:  No edema Access:  AVF @ LUA with + bruit  Lab Results:  Basename 01/02/12 0315 01/01/12 1633  WBC 8.7 10.9*  HGB 10.5* 11.0*  HCT 31.8* 33.4*  PLT 268 285   BMET:  Basename 01/02/12 0315 01/01/12 1633  NA 140 138  K 4.5 3.8  CL 100 95*  CO2 27 29  GLUCOSE 69* 87  BUN 35* 29*  CREATININE 5.40* 4.50*  CALCIUM 9.2 9.4  ALBUMIN 2.7* 2.9*   No results found for this basename: PTH:2 in the last 72 hours Iron Studies: No results found for this basename: IRON,TIBC,TRANSFERRIN,FERRITIN in the last 72 hours  Dialysis Orders: Center: [place] on MWF.  EDW 73 kg HD Bath 2K/2Ca Time 3 hrs 45 mins Heparin 3000 U bolus, 2000 U mid-Tx. Access AVF @ LUA BFR 400 DFR A1.5  Zemplar 4 mcg IV/HD Epogen 0 Units IV/HD Venofer 100 mg on Wed  UA > TNTC WBC's, many bact  Assessment/Plan: 1. AMS - secondary to UTI, s/p Cipro, mental status better; urine culture pending. Not on any abx? 2. ESRD - HD on MWF @ [place]; K 4.5 today s/p 1 1/2 hrs of 4-hr Tx on 10/11. Stable for next HD tomorrow. 3. Hypertension/volume - BP 106/44, on Metoprolol 50 mg bid here as at home; current wt 74 kg with EDW 73 kg; lungs clear per chest x-ray. 4. Anemia - Hgb 10.5, no  outpatient Epogen, Venofer 100 mg on Wed. 5. Metabolic bone disease - Ca 9.2 (10.2 corrected), last P, on Zemplar 4 mcg, Phoslo 2 with meals. 6. Nutrition - Alb 2.7. 7. Yeast infection - on Diflucan and nystatin ointment for groin rash. 8. Hx A-fib - on Metoprolol, not a candidate for Coumadin secondary to fall risk. 9. PVD - not a candidate for revascularization.    LOS: 2 days   LYLES,CHARLES 01/03/2012,7:08 AM  Patient seen and examined and agree with assessment and plan as above with additions as indicated.  Vinson Moselle  MD [facility] [phone] pgr    [phone] cell 01/03/2012, 2:51 PM

## 2012-01-03 NOTE — Progress Notes (Signed)
Utilization Review Completed.  

## 2012-01-04 ENCOUNTER — Observation Stay (HOSPITAL_COMMUNITY): Payer: Medicare Other

## 2012-01-04 DIAGNOSIS — I10 Essential (primary) hypertension: Secondary | ICD-10-CM

## 2012-01-04 LAB — RENAL FUNCTION PANEL
BUN: 65 mg/dL — ABNORMAL HIGH (ref 6–23)
Chloride: 96 mEq/L (ref 96–112)
Creatinine, Ser: 8.82 mg/dL — ABNORMAL HIGH (ref 0.50–1.35)
Glucose, Bld: 107 mg/dL — ABNORMAL HIGH (ref 70–99)
Potassium: 4.9 mEq/L (ref 3.5–5.1)

## 2012-01-04 LAB — CBC
HCT: 30.1 % — ABNORMAL LOW (ref 39.0–52.0)
Hemoglobin: 10.1 g/dL — ABNORMAL LOW (ref 13.0–17.0)
MCV: 99.3 fL (ref 78.0–100.0)
WBC: 11 10*3/uL — ABNORMAL HIGH (ref 4.0–10.5)

## 2012-01-04 MED ORDER — FLUCONAZOLE 200 MG PO TABS: 200.0000 mg | ORAL_TABLET | Freq: Every day | ORAL | Status: DC

## 2012-01-04 MED ORDER — CIPROFLOXACIN HCL 500 MG PO TABS: 500.0000 mg | ORAL_TABLET | Freq: Every day | ORAL | Status: DC

## 2012-01-04 MED ORDER — CHARCOAL ACTIVATED PO LIQD
50.0000 g | Freq: Four times a day (QID) | ORAL | Status: DC | PRN
  Filled 2012-01-04: qty 240

## 2012-01-04 MED ORDER — POLYETHYLENE GLYCOL 3350 17 G PO PACK: 17.0000 g | PACK | Freq: Every day | ORAL | Status: AC

## 2012-01-04 NOTE — Discharge Summary (Signed)
Physician Discharge Summary  Gerald Tucker UJW:[id number] DOB: 1918-12-05 DOA: 01/01/2012  PCP: Gerald Meo, MD  Admit date: 01/01/2012 Discharge date: 01/04/2012  Recommendations for Outpatient Follow-up:  1. Home with outpatient followup with PCP and dialysis  Discharge Diagnoses:  Principal Problem:  Altered mental status secondary to*UTI (lower urinary tract infection)  Active Problems:  Hypertension  CAD (coronary artery disease)  ESRD on dialysis  Constipation - functional   Discharge Condition: Fair  Diet recommendation: Renal  Filed Weights   01/02/12 2205 01/03/12 2200 01/04/12 0902  Weight: 74 kg (163 lb 2.3 oz) 74.8 kg (164 lb 14.5 oz) 72.6 kg (160 lb 0.9 oz)    History of present illness:  For more details please review admission H&P, but in brief, 78 year old male with history of end-stage renal disease on dialysis brought in by his daughter for her episodes of vomiting, and altered mental status with confusion this secondary to UTI.   Hospital Course:    Altered mental status  Secondary to UTI. Patient started on ciprofloxacin renally dosed. Urine culture growing greater than 100,000 colonies but could not determine predominant bacteria. Patient also had recent nausea and vomiting possibly with UTI versus viral gastroenteritis. Patient informs being constipated and had a small bowel movement after bowel regimen added..-Patient mental status back to baseline at this time. I will discharge him on 4 more days of by mouth ciprofloxacin to complete a seven-day course.  End-stage renal disease,  Renal team following . Patient getting hemodialysis today and can be discharged home with outpatient followup  Atrial fibrillation,  patient on metoprolol for rate control, not on anticoagulation given recurrent falls   bilateral peripheral artery disease  Follow up as outpatient   History of sleep apnea  Patient is on CPAP at home. Resume  Yeast infection of  the groin  On Diflucan as outpatient and continued here and has completed almost 7 days of treatment. Continue with nystatin cream. Clinically improved      Procedures:  None  Consultations:     K[facility] for  dialysis  Discharge Exam: Filed Vitals:   01/04/12 1030 01/04/12 1100 01/04/12 1130 01/04/12 1200  BP: 93/47 106/55 98/40 110/52  Pulse: 85 82 81 87  Temp:      TempSrc:      Resp: 18 19 18 16   Height:      Weight:      SpO2: 98% 98% 99% 94%    General: Elderly male lying in bed in no acute distress  HEENT: No pallor, no icterus, moist oral mucosa  Cardiovascular: Normal S1 and S2, no murmurs rub or gallop  Respiratory: Clear breath sounds bilaterally no added sounds  Abdomen: Soft, nontender nondistended, bowel sounds present , fungal rash over the groin appears improved Extremities: Warm, no edema left UE graft, multiple necrotic lesions over right toe and absent pulses. Normal distal pulses over left leg.  CNS: AAO x3, hard of hearing,   Discharge Instructions  Discharge Orders    Future Appointments: Provider: Department: Dept Phone: Center:   01/21/2012 9:45 AM Gerald Hazel, MD [facility] [phone] [id number]       Medication List     As of 01/04/2012 12:25 PM    TAKE these medications         acetaminophen 325 MG tablet   Commonly known as: TYLENOL   Take 650 mg by mouth every 6 (six) hours as needed. For pain      aspirin 81 MG  tablet   Take 81 mg by mouth daily.      calcium acetate 667 MG capsule   Commonly known as: PHOSLO   Take 1,334 mg by mouth 3 (three) times daily with meals.      ciprofloxacin 500 MG tablet   Commonly known as: CIPRO   Take 1 tablet (500 mg total) by mouth daily.      fluconazole 200 MG tablet   Commonly known as: DIFLUCAN   Take 1 tablet (200 mg total) by mouth daily.      isosorbide mononitrate 60 MG 24 hr tablet   Commonly known as: IMDUR   Take 60 mg by mouth daily.       metoprolol 50 MG tablet   Commonly known as: LOPRESSOR   Take 50 mg by mouth 2 (two) times daily.      multivitamin tablet   Take 1 tablet by mouth daily.      nitroGLYCERIN 0.4 MG SL tablet   Commonly known as: NITROSTAT   Place 1 tablet (0.4 mg total) under the tongue every 5 (five) minutes as needed.      nystatin ointment   Commonly known as: MYCOSTATIN   Apply 1 application topically 2 (two) times daily.      omeprazole 20 MG capsule   Commonly known as: PRILOSEC   Take 20 mg by mouth 2 (two) times daily.      polyethylene glycol packet   Commonly known as: MIRALAX / GLYCOLAX   Take 17 g by mouth daily.      polyethylene glycol packet   Commonly known as: MIRALAX / GLYCOLAX   Take 17 g by mouth daily.      simvastatin 20 MG tablet   Commonly known as: ZOCOR   Take 20 mg by mouth at bedtime.      traMADol 50 MG tablet   Commonly known as: ULTRAM   Take 50 mg by mouth daily.           Follow-up Information    Follow up with ARONSON,RICHARD A, MD. In 1 week.   Contact information:   [address] [facility] [place] [address] [phone]           The results of significant diagnostics from this hospitalization (including imaging, microbiology, ancillary and laboratory) are listed below for reference.    Significant Diagnostic Studies: Dg Chest 2 View  01/01/2012  *RADIOLOGY REPORT*  Clinical Data: Left rib pain with fall.  CHEST - 2 VIEW  Comparison: 08/22/2010.  Findings: No contusion, effusion or pneumothorax.  No acute rib abnormalities.  Chronic deformities left sixth and seventh ribs. Elevated right hemidiaphragm.  The lungs are clear.  The heart and mediastinal structures are normal. Incidental note made of a fusion plate in the lower cervical spine and vascular stent in the region of the left axilla.  IMPRESSION: No evidence of acute trauma.  No active disease.   Original Report Authenticated By: Mervin Hack,  M.D.    Dg Hip Complete Left  01/01/2012  *RADIOLOGY REPORT*  Clinical Data: Left hip pain post fall  LEFT HIP - COMPLETE 2+ VIEW  Comparison: Pelvic radiograph 01/21/2010  Findings: Prior lower lumbar fusion. Diffuse osseous demineralization. Symmetric hip and SI joints. No acute fracture, dislocation, or bone destruction. Extensive atherosclerotic calcification.  IMPRESSION: Osseous demineralization. No acute abnormalities.   Original Report Authenticated By: Lollie Marrow, M.D.     Microbiology: Recent Results (from the past 240  hour(s))  URINE CULTURE     Status: Normal   Collection Time   01/01/12  4:23 PM      Component Value Range Status Comment   Specimen Description URINE, CLEAN CATCH   Final    Special Requests NONE   Final    Culture  Setup Time 01/01/2012 17:02   Final    Colony Count >=100,000 COLONIES/ML   Final    Culture     Final    Value: Multiple bacterial morphotypes present, none predominant. Suggest appropriate recollection if clinically indicated.   Report Status 01/03/2012 FINAL   Final   MRSA PCR SCREENING     Status: Normal   Collection Time   01/01/12 10:10 PM      Component Value Range Status Comment   MRSA by PCR NEGATIVE  NEGATIVE Final      Labs: Basic Metabolic Panel:  Lab 01/04/12 1610 01/02/12 0315 01/01/12 1633  NA 135 140 138  K 4.9 4.5 3.8  CL 96 100 95*  CO2 25 27 29   GLUCOSE 107* 69* 87  BUN 65* 35* 29*  CREATININE 8.82* 5.40* 4.50*  CALCIUM 9.7 9.2 9.4  MG -- -- --  PHOS 4.6 -- --   Liver Function Tests:  Lab 01/04/12 0800 01/02/12 0315 01/01/12 1633  AST -- 43* 46*  ALT -- 27 31  ALKPHOS -- 69 73  BILITOT -- 0.5 0.6  PROT -- 6.1 6.5  ALBUMIN 2.6* 2.7* 2.9*    Lab 01/01/12 1633  LIPASE 54  AMYLASE --   No results found for this basename: AMMONIA:5 in the last 168 hours CBC:  Lab 01/04/12 0800 01/02/12 0315 01/01/12 1633  WBC 11.0* 8.7 10.9*  NEUTROABS -- -- 8.6*  HGB 10.1* 10.5* 11.0*  HCT 30.1* 31.8* 33.4*  MCV  99.3 98.8 97.9  PLT 265 268 285   Cardiac Enzymes:  Lab 01/02/12 0851 01/02/12 0315 01/01/12 2139 01/01/12 1650  CKTOTAL -- -- -- --  CKMB -- -- -- --  CKMBINDEX -- -- -- --  TROPONINI <0.30 <0.30 <0.30 <0.30   BNP: BNP (last 3 results) No results found for this basename: PROBNP:3 in the last 8760 hours CBG: No results found for this basename: GLUCAP:5 in the last 168 hours  Time coordinating discharge: 40 minutes  Signed:  Eddie North  [facility] 01/04/2012, 12:25 PM

## 2012-01-04 NOTE — Progress Notes (Signed)
I have seen and examined this patient and agree with the plan of care. Much better mentally today . Recognizes me and orientated as to his dialysis schedule  Karlin Heilman W 01/04/2012, 1:11 PM

## 2012-01-04 NOTE — Progress Notes (Signed)
   CARE MANAGEMENT NOTE 01/04/2012  Patient:  Gerald Tucker, Gerald Tucker   Account Number:  [id number]  Date Initiated:  01/04/2012  Documentation initiated by:  Darlyne Russian  Subjective/Objective Assessment:   Patient admitted with infiltrated HD graft     Action/Plan:   Progression of care and discharge planning   Anticipated DC Date:  01/04/2012   Anticipated DC Plan:  HOME W HOME HEALTH SERVICES      DC Planning Services  CM consult      [facility] Choice  HOME HEALTH   Choice offered to / List presented to:  C-4 Adult Children        HH arranged  HH-2 PT      [facility] agency  [facility]   Status of service:  In process, will continue to follow Medicare Important Message given?   (If response is "NO", the following Medicare IM given date fields will be blank) Date Medicare IM given:   Date Additional Medicare IM given:    Discharge Disposition:    Per UR Regulation:    If discussed at Long Length of Stay Meetings, dates discussed:    Comments:  01/04/2012  [address] RN, [place]  [phone] CM referral PT  Spoke with patient's daughter Dois Davenport regarding discharge planning and home health PT.  She requests [facility] for home health.  [facility] called with referral for home PT.

## 2012-01-04 NOTE — Progress Notes (Signed)
Subjective: cos constipation,about to go to Hemodialysis Objective Vital signs in last 24 hours: Filed Vitals:   01/03/12 1330 01/03/12 1800 01/03/12 2200 01/04/12 0459  BP: 116/54 102/51 122/69 120/65  Pulse: 80 80 85 83  Temp: 98 F (36.7 C) 98.4 F (36.9 C) 98.1 F (36.7 C) 98.2 F (36.8 C)  TempSrc: Oral Oral Oral Oral  Resp: 18 18 18 18   Height:      Weight:   74.8 kg (164 lb 14.5 oz)   SpO2: 97% 96% 98% 95%   Weight change: 0.8 kg (1 lb 12.2 oz)  Intake/Output Summary (Last 24 hours) at 01/04/12 0859 Last data filed at 01/03/12 1700  Gross per 24 hour  Intake    480 ml  Output      0 ml  Net    480 ml   Labs: Basic Metabolic Panel:  Lab 01/02/12 1610 01/01/12 1633  NA 140 138  K 4.5 3.8  CL 100 95*  CO2 27 29  GLUCOSE 69* 87  BUN 35* 29*  CREATININE 5.40* 4.50*  CALCIUM 9.2 9.4  ALB -- --  PHOS -- --   Liver Function Tests:  Lab 01/02/12 0315 01/01/12 1633  AST 43* 46*  ALT 27 31  ALKPHOS 69 73  BILITOT 0.5 0.6  PROT 6.1 6.5  ALBUMIN 2.7* 2.9*    Lab 01/01/12 1633  LIPASE 54  AMYLASE --   No results found for this basename: AMMONIA:3 in the last 168 hours CBC:  Lab 01/02/12 0315 01/01/12 1633  WBC 8.7 10.9*  NEUTROABS -- 8.6*  HGB 10.5* 11.0*  HCT 31.8* 33.4*  MCV 98.8 97.9  PLT 268 285   Cardiac Enzymes:  Lab 01/02/12 0851 01/02/12 0315 01/01/12 2139 01/01/12 1650  CKTOTAL -- -- -- --  CKMB -- -- -- --  CKMBINDEX -- -- -- --  TROPONINI <0.30 <0.30 <0.30 <0.30   CBG: No results found for this basename: GLUCAP:5 in the last 168 hours  Iron Studies: No results found for this basename: IRON,TIBC,TRANSFERRIN,FERRITIN in the last 72 hours Studies/Results: No results found. Medications:      . aspirin  81 mg Oral Daily  . calcium acetate  1,334 mg Oral TID WC  . ciprofloxacin  400 mg Intravenous Q24H  . feeding supplement (NEPRO CARB STEADY)  237 mL Oral Daily  . fluconazole  200 mg Oral Daily  . isosorbide mononitrate  60 mg  Oral Daily  . metoprolol  50 mg Oral BID  . nystatin ointment   Topical BID  . pantoprazole  40 mg Oral Q1200  . polyethylene glycol  17 g Oral BID  . simvastatin  20 mg Oral QHS  . sodium chloride  3 mL Intravenous Q12H  . traMADol  50 mg Oral Daily   I  have reviewed scheduled and prn medications.  Physical Exam=  General appearance: Alert, NAD, OX3 Resp: CTA without rales, rhonchi, or wheezes  Cardio: RRR without murmur or rub  GI: + BS, soft and nontender  Extremities: No Pedal edema  Access: AVF @ LUA with + bruit  Dialysis Orders: Center: [place] on MWF.  EDW 73 kg HD Bath 2K/2Ca Time 3 hrs 45 mins Heparin 3000 U bolus, 2000 U mid-Tx. Access AVF @ LUA BFR 400 DFR A1.5  Zemplar 4 mcg IV/HD Epogen 0 Units IV/HD Venofer 100 mg on Wed  Problem Plan= 1. AMS - secondary to UTI= back to baseline ms / On IV Cipro per Admit  2. .ESRD - HD on MWF @ [place]; Stable for  HD today 3. Hypertension/volume - BP 120/65, on Metoprolol 50 mg bid here as at home; current wt 74.8 kg with EDW 73 kg; lungs clearand per chest x-ray.fu post wt and bps today on hd, No change in edw? 4. Anemia - Hgb 10.5, no outpatient Epogen, Venofer 100 mg on Wed.fu lab pre hd today 5. Metabolic bone disease - Ca 9.2 (10.2 corrected), last P, on Zemplar 4 mcg, Phoslo 2 with meals. 6. Nutrition - Alb 2.7. 7. Yeast infection - on Diflucan and nystatin ointment for groin rash. 8. Hx A-fib - on Metoprolol, not a candidate for Coumadin secondary to fall risk. 9. PVD - not a candidate for revascularization.   Lenny Pastel, PA-C [facility] [phone]  01/04/2012,8:59 AM  LOS: 3 days \

## 2012-01-04 NOTE — Progress Notes (Deleted)
Pt had a 5 beat run of vtach. Pt asymptomatic. VS stable MD made aware. No new orders received.

## 2012-01-05 NOTE — Progress Notes (Signed)
Pt discharged to home after visit summary reviewed with pt's son. Pt remains stable. No signs and symptoms of distress. Educated to return to ER in the event of SOB, dizziness, chest pain, or fainting. Laverda Sorenson, RN

## 2012-01-21 ENCOUNTER — Ambulatory Visit (INDEPENDENT_AMBULATORY_CARE_PROVIDER_SITE_OTHER): Payer: Medicare Other | Admitting: Cardiovascular Disease

## 2012-01-21 ENCOUNTER — Encounter: Payer: Self-pay | Admitting: Cardiovascular Disease

## 2012-01-21 VITALS — BP 113/62 | HR 77 | Ht 65.0 in | Wt 174.0 lb

## 2012-01-21 DIAGNOSIS — I739 Peripheral vascular disease, unspecified: Secondary | ICD-10-CM

## 2012-01-21 NOTE — Progress Notes (Signed)
History of Present Illness: 78 yo male with history of CAD, PAD, ESRD on HD, HTN, GERD who is here today for PV evaluation. His cardiac issues are followed by Dr. [place]. He tells me today that he has been having issues with ulcerations on the toes of his right foot for one year. He keeps hitting his toes on things and also had some issues with his shoes not fitting. [facility] Wynelle Cleveland. His wounds are healing. He is here today to explore options for revascularization. His children are present. He had a distal aortogram with bilateral lower extremity runoff in December 2010 per Dr. Myra Gianotti with [facility]. He was found to have total occlusion of all three tibial vessel in both legs with an extensive collateral network supplying both feet. There were no revascularization options at that time. Non-invasive studies 12/29/11 with right ABI 0.77, left ABI 0.54. No significant disease from the aorta to the popliteals but dampening of pressure below both knees with poor flow into toes.   He reports pain in both legs at night. No discoloration of toes. Ulcers on right first and fourth toe healing slowly. No fever or chills. No other complaints.   Primary Care Physician: Gerald Tucker   Past Medical History  Diagnosis Date  . Hypertension   . Ulcer   . GERD (gastroesophageal reflux disease)   . CAD (coronary artery disease)     distal LAD occlusion  . Poor circulation   . ESRD on dialysis   . Hyperlipidemia   . Cancer   . Knee pain, right   . OSA (obstructive sleep apnea)   . PAD (peripheral artery disease)   . IDA (iron deficiency anemia)   . Hyperparathyroidism   . Arthritis     Past Surgical History  Procedure Date  . Back surgery   . Neck surgery   . Hernia repair     right  . Knee surgery   . Leg surgery   . Shoulder surgery   . Cardiac catheterization 02/06/2005    EF 65-70%.  . Av fistula placement     LEFT ARM  . Cataract  extraction, bilateral   . Cholecystectomy   . Transthoracic echocardiogram 04/04/2009    EF 60-65%  . Transthoracic echocardiogram 02/07/2008    EF 55-60%  . Cardiovascular stress test 06/01/2006    EF 55%. NORMAL LV FUNCTION    Current Outpatient Prescriptions  Medication Sig Dispense Refill  . acetaminophen (TYLENOL) 325 MG tablet Take 650 mg by mouth every 6 (six) hours as needed. For pain      . aspirin 81 MG tablet Take 81 mg by mouth daily.        . calcium acetate (PHOSLO) 667 MG capsule Take 1,334 mg by mouth 3 (three) times daily with meals.      . ciprofloxacin (CIPRO) 500 MG tablet Take 1 tablet (500 mg total) by mouth daily.  4 tablet  0  . fluconazole (DIFLUCAN) 200 MG tablet Take 1 tablet (200 mg total) by mouth daily.  4 tablet  0  . isosorbide mononitrate (IMDUR) 60 MG 24 hr tablet Take 60 mg by mouth daily.        . metoprolol (LOPRESSOR) 50 MG tablet Take 50 mg by mouth 2 (two) times daily.        . Multiple Vitamin (MULTIVITAMIN) tablet Take 1 tablet by mouth daily.        Marland Kitchen  nitroGLYCERIN (NITROSTAT) 0.4 MG SL tablet Place 1 tablet (0.4 mg total) under the tongue every 5 (five) minutes as needed.  25 tablet  11  . nystatin ointment (MYCOSTATIN) Apply 1 application topically 2 (two) times daily.      Marland Kitchen omeprazole (PRILOSEC) 20 MG capsule Take 20 mg by mouth 2 (two) times daily.        . polyethylene glycol (MIRALAX / GLYCOLAX) packet Take 17 g by mouth daily.        . polyethylene glycol (MIRALAX / GLYCOLAX) packet Take 17 g by mouth daily.  10 each  0  . simvastatin (ZOCOR) 20 MG tablet Take 20 mg by mouth at bedtime.        . traMADol (ULTRAM) 50 MG tablet Take 50 mg by mouth daily.        Allergies  Allergen Reactions  . Morphine And Related   . Penicillins   . Ultram (Tramadol Hcl)   . Amoxicillin   . Darvocet (Propoxyphene-Acetaminophen)   . Keflex (Cephalexin)   . Neurontin (Gabapentin)   . Tramadol     History   Social History  . Marital Status:  Widowed    Spouse Name: N/A    Number of Children: N/A  . Years of Education: N/A   Occupational History  . Not on file.   Social History Main Topics  . Smoking status: Never Smoker   . Smokeless tobacco: Never Used  . Alcohol Use: No  . Drug Use:   . Sexually Active:    Other Topics Concern  . Not on file   Social History Narrative  . No narrative on file    Family History  Problem Relation Age of Onset  . Stroke Mother   . Heart attack Father   . Stroke Brother   . Stroke Brother   . Stroke Brother     Review of Systems:  As stated in the HPI and otherwise negative.   BP 113/62  Pulse 77  Ht 5\' 5"  (1.651 m)  Wt 174 lb (78.926 kg)  BMI 28.96 kg/m2  Physical Examination: General: Elderly. Frail.  NAD HEENT: OP clear, mucus membranes moist SKIN: warm, dry. No rashes. Neuro: No focal deficits Musculoskeletal: Muscle strength 5/5 all ext Psychiatric: Mood and affect normal Neck: No JVD, no carotid bruits, no thyromegaly, no lymphadenopathy. Lungs:Clear bilaterally, no wheezes, rhonci, crackles Cardiovascular: Regular rate and rhythm. No loud murmurs Abdomen:Soft. Bowel sounds present. Non-tender.  Extremities: No lower extremity edema. Pulses are non-palpable in the bilateral DP/PT. Ulcers 1st and 4th toes right foot, granulation tissue present.   Distal aortogram with bilateral LE runoff: December 2010: FINDINGS:  Aortogram:  The visualized portions of the suprarenal   abdominal aorta show minimal disease.  There are single renal arteries   bilaterally.  There is focal stenosis at the origin of the right renal   artery which was not well defined by the strictures.  The left renal   artery is widely patent.  The infrarenal abdominal aorta is calcified,   but patent without significant stenosis.  Bilateral common iliac   arteries were widely patent.  Bilateral external iliac arteries were   extremely tortuous, but widely patent.  Bilateral hypogastric arteries     are patent.      Right lower extremity:  The right common femoral artery is widely   patent.  The right profunda femoral artery is widely patent.  The right   superficial femoral artery is widely patent  with calcification in the   adductor canal.  Right popliteal artery is widely patent.  The patient   has diffuse tibial disease.  The anterior tibial artery is occluded just   distal to its origin.  The peroneal artery occludes just distal to its   origin and the posterior tibial artery is occluded past its origin.   There is reconstitution of a diseased anterior tibial artery in the mid   calf, which does continue down across the foot.      Left lower extremity:  The left common femoral artery is widely patent.   The left profunda femoral artery is widely patent.  Left superficial   femoral artery is widely patent.  There is focal stenosis within the   popliteal artery above the knee measuring approximately 40%.  The below-   knee popliteal artery is widely patent.  The patient has diffuse tibial   disease with proximal occlusion of all 3 tibial vessels.  There is   reconstitution of a diseased dorsalis pedis on the left hip.      At this point in time, I did not feel the patient had any options for   revascularization of the left leg.  Therefore, catheters and wires were   removed.  The patient was taken to holding area for sheath pull.      IMPRESSION:   1. Minimal aortoiliac disease.   2. Patent bilateral superficial femoral and popliteal arteries.   3. Severe tibial disease on the right, all 3 tibial vessels were       occluded.  There is reconstitution of the anterior tibial artery       and the mid calf, which does cross the ankle, but it is very       diseased.  On the left, all 3 tibial vessels are occluded.  There       is reconstitution of a diseased dorsalis pedis vessel on the foot.   4. No options for revascularizations.   Assessment and Plan:   1. PAD: He has  healing ulcers over first and fourth toes of right foot. He is known to have occlusion of all tibial vessels below the knee in both legs by angiogram in 2010. No need to repeat angiogram as we know what we will find since ABI are still just moderately reduced. I do not think there is aortoiliac disease. At his age, would not add Pletal or Plavix. Continue wound care. Discussed with pt and family and they agree.

## 2012-01-21 NOTE — Patient Instructions (Addendum)
Your physician recommends that you schedule a follow-up appointment  As needed with Dr. McAlhany  

## 2012-01-27 ENCOUNTER — Other Ambulatory Visit (HOSPITAL_COMMUNITY): Payer: Self-pay | Admitting: Nephrology

## 2012-01-27 DIAGNOSIS — N186 End stage renal disease: Secondary | ICD-10-CM

## 2012-01-28 ENCOUNTER — Encounter (HOSPITAL_COMMUNITY): Payer: Self-pay

## 2012-01-28 ENCOUNTER — Other Ambulatory Visit (HOSPITAL_COMMUNITY): Payer: Self-pay | Admitting: Nephrology

## 2012-01-28 ENCOUNTER — Ambulatory Visit (HOSPITAL_COMMUNITY)
Admission: RE | Admit: 2012-01-28 | Discharge: 2012-01-28 | Disposition: A | Discharge status: Home / Self Care | Payer: Medicare Other | Source: Ambulatory Visit | Attending: Nephrology | Admitting: Nephrology

## 2012-01-28 DIAGNOSIS — Z992 Dependence on renal dialysis: Secondary | ICD-10-CM | POA: Insufficient documentation

## 2012-01-28 DIAGNOSIS — K219 Gastro-esophageal reflux disease without esophagitis: Secondary | ICD-10-CM | POA: Insufficient documentation

## 2012-01-28 DIAGNOSIS — T82898A Other specified complication of vascular prosthetic devices, implants and grafts, initial encounter: Secondary | ICD-10-CM | POA: Insufficient documentation

## 2012-01-28 DIAGNOSIS — I2510 Atherosclerotic heart disease of native coronary artery without angina pectoris: Secondary | ICD-10-CM | POA: Insufficient documentation

## 2012-01-28 DIAGNOSIS — I120 Hypertensive chronic kidney disease with stage 5 chronic kidney disease or end stage renal disease: Secondary | ICD-10-CM | POA: Insufficient documentation

## 2012-01-28 DIAGNOSIS — Y849 Medical procedure, unspecified as the cause of abnormal reaction of the patient, or of later complication, without mention of misadventure at the time of the procedure: Secondary | ICD-10-CM | POA: Insufficient documentation

## 2012-01-28 DIAGNOSIS — G4733 Obstructive sleep apnea (adult) (pediatric): Secondary | ICD-10-CM | POA: Insufficient documentation

## 2012-01-28 DIAGNOSIS — N186 End stage renal disease: Secondary | ICD-10-CM

## 2012-01-28 DIAGNOSIS — D509 Iron deficiency anemia, unspecified: Secondary | ICD-10-CM | POA: Insufficient documentation

## 2012-01-28 LAB — POTASSIUM: Potassium: 6.3 mEq/L (ref 3.5–5.1)

## 2012-01-28 MED ORDER — MIDAZOLAM HCL 2 MG/2ML IJ SOLN
INTRAMUSCULAR | Status: AC | PRN
  Administered 2012-01-28: 0.5 mg via INTRAVENOUS

## 2012-01-28 MED ORDER — FENTANYL CITRATE 0.05 MG/ML IJ SOLN
INTRAMUSCULAR | Status: AC
  Filled 2012-01-28: qty 2

## 2012-01-28 MED ORDER — FENTANYL CITRATE 0.05 MG/ML IJ SOLN
INTRAMUSCULAR | Status: AC | PRN
  Administered 2012-01-28: 12.5 ug via INTRAVENOUS

## 2012-01-28 MED ORDER — DIPHENHYDRAMINE HCL 50 MG/ML IJ SOLN
INTRAMUSCULAR | Status: AC | PRN
  Administered 2012-01-28: 25 mg via INTRAVENOUS

## 2012-01-28 MED ORDER — DIPHENHYDRAMINE HCL 50 MG/ML IJ SOLN
INTRAMUSCULAR | Status: AC
  Filled 2012-01-28: qty 1

## 2012-01-28 MED ORDER — GELATIN ABSORBABLE 12-7 MM EX MISC
CUTANEOUS | Status: AC
  Filled 2012-01-28: qty 1

## 2012-01-28 MED ORDER — HEPARIN SODIUM (PORCINE) 1000 UNIT/ML IJ SOLN
INTRAMUSCULAR | Status: AC
  Filled 2012-01-28: qty 1

## 2012-01-28 MED ORDER — MIDAZOLAM HCL 2 MG/2ML IJ SOLN
INTRAMUSCULAR | Status: AC
  Filled 2012-01-28: qty 4

## 2012-01-28 MED ORDER — VANCOMYCIN HCL IN DEXTROSE 1-5 GM/200ML-% IV SOLN
1000.0000 mg | Freq: Once | INTRAVENOUS | Status: AC
  Administered 2012-01-28: 1000 mg via INTRAVENOUS
  Filled 2012-01-28: qty 200

## 2012-01-28 MED ORDER — ALTEPLASE 2 MG IJ SOLR
2.0000 mg | Freq: Once | INTRAMUSCULAR | Status: DC
  Filled 2012-01-28: qty 2

## 2012-01-28 NOTE — ED Notes (Signed)
Patient complains of right arm itchy around IV site. No redness noted. Dr. Miles Costain aware. Benadryl 25mg  given. Dr Miles Costain also aware patient in atrial fib, controlled rate. Son states pt has had atrial fib before.

## 2012-01-28 NOTE — Procedures (Signed)
Successful LT IJ HD CATH INSERTION NO COMP STABLE READY FOR USE FULL REPORT IN PACS

## 2012-01-28 NOTE — H&P (Signed)
Gerald Tucker is an 78 y.o. male.   Chief Complaint: Left arm dialysis fistula clotted Last use 11/4; good flow Clotted 11/6 Last intervention:  08/27/11 - left arm fistula declot - successful Scheduled now for thrombolysis with poss angioplasty/stent placement With possible dialysis catheter if needed HPI: HTN; GERD; ESRD; CAD; HLD; skin ca; PAD; IDA  Past Medical History  Diagnosis Date  . Hypertension   . Ulcer   . GERD (gastroesophageal reflux disease)   . CAD (coronary artery disease)     distal LAD occlusion  . Poor circulation   . ESRD on dialysis   . Hyperlipidemia   . Cancer     Skin  . Knee pain, right   . OSA (obstructive sleep apnea)   . PAD (peripheral artery disease)   . IDA (iron deficiency anemia)   . Hyperparathyroidism   . Arthritis     Past Surgical History  Procedure Date  . Back surgery   . Neck surgery   . Hernia repair     right  . Knee surgery   . Leg surgery   . Shoulder surgery   . Cardiac catheterization 02/06/2005    EF 65-70%.  . Av fistula placement     LEFT ARM  . Cataract extraction, bilateral   . Cholecystectomy   . Transthoracic echocardiogram 04/04/2009    EF 60-65%  . Transthoracic echocardiogram 02/07/2008    EF 55-60%  . Cardiovascular stress test 06/01/2006    EF 55%. NORMAL LV FUNCTION    Family History  Problem Relation Age of Onset  . Stroke Mother   . Heart attack Father   . Stroke Brother   . Stroke Brother   . Stroke Brother    Social History:  reports that he has never smoked. He has never used smokeless tobacco. He reports that he does not drink alcohol or use illicit drugs.  Allergies:  Allergies  Allergen Reactions  . Penicillins   . Morphine And Related     Agitated and sees things  . Amoxicillin   . Darvocet (Propoxyphene-Acetaminophen)     Agitated and sees things  . Keflex (Cephalexin)   . Neurontin (Gabapentin)     Agitated and confused  . Ultram (Tramadol Hcl)     Agitated and sees things.  Can take small amounts     (Not in a hospital admission)  No results found for this or any previous visit (from the past 48 hour(s)). No results found.  Review of Systems  Constitutional: Negative for fever and chills.  Respiratory: Negative for shortness of breath.   Cardiovascular: Negative for chest pain.  Gastrointestinal: Negative for nausea, vomiting and abdominal pain.  Neurological: Positive for weakness.    Blood pressure 108/54, pulse 55, resp. rate 16, SpO2 94.00%. Physical Exam  Constitutional: He is oriented to person, place, and time.  Cardiovascular: Normal rate and regular rhythm.   Murmur heard. Respiratory: Effort normal and breath sounds normal. He has no wheezes.  GI: Soft. Bowel sounds are normal. There is no tenderness.  Musculoskeletal:       In wc; ulcers on toes  Neurological: He is alert and oriented to person, place, and time.       HOH  Psychiatric: He has a normal mood and affect. His behavior is normal. Judgment and thought content normal.     Assessment/Plan Left arm fistula clotted Scheduled for thrombolysis and poss pta/stent placement; Poss catheter if needed Pt and son aware of procedure  benefits and risks and agreeable to proceed Consent signed and in chart  Marceil Welp A 01/28/2012, 8:27 AM

## 2012-05-08 ENCOUNTER — Emergency Department (HOSPITAL_COMMUNITY)
Admission: EM | Admit: 2012-05-08 | Discharge: 2012-05-08 | Disposition: A | Discharge status: Home / Self Care | Payer: Medicare Other | Attending: Emergency Medicine | Admitting: Emergency Medicine

## 2012-05-08 ENCOUNTER — Encounter (HOSPITAL_COMMUNITY): Payer: Self-pay | Admitting: Nurse Practitioner

## 2012-05-08 ENCOUNTER — Emergency Department (HOSPITAL_COMMUNITY): Payer: Medicare Other

## 2012-05-08 DIAGNOSIS — Z872 Personal history of diseases of the skin and subcutaneous tissue: Secondary | ICD-10-CM | POA: Insufficient documentation

## 2012-05-08 DIAGNOSIS — W1809XA Striking against other object with subsequent fall, initial encounter: Secondary | ICD-10-CM | POA: Insufficient documentation

## 2012-05-08 DIAGNOSIS — Z85828 Personal history of other malignant neoplasm of skin: Secondary | ICD-10-CM | POA: Insufficient documentation

## 2012-05-08 DIAGNOSIS — R109 Unspecified abdominal pain: Secondary | ICD-10-CM | POA: Insufficient documentation

## 2012-05-08 DIAGNOSIS — S20219A Contusion of unspecified front wall of thorax, initial encounter: Secondary | ICD-10-CM

## 2012-05-08 DIAGNOSIS — N186 End stage renal disease: Secondary | ICD-10-CM | POA: Insufficient documentation

## 2012-05-08 DIAGNOSIS — W19XXXA Unspecified fall, initial encounter: Secondary | ICD-10-CM

## 2012-05-08 DIAGNOSIS — I2510 Atherosclerotic heart disease of native coronary artery without angina pectoris: Secondary | ICD-10-CM | POA: Insufficient documentation

## 2012-05-08 DIAGNOSIS — G4733 Obstructive sleep apnea (adult) (pediatric): Secondary | ICD-10-CM | POA: Insufficient documentation

## 2012-05-08 DIAGNOSIS — Z8679 Personal history of other diseases of the circulatory system: Secondary | ICD-10-CM | POA: Insufficient documentation

## 2012-05-08 DIAGNOSIS — K219 Gastro-esophageal reflux disease without esophagitis: Secondary | ICD-10-CM | POA: Insufficient documentation

## 2012-05-08 DIAGNOSIS — Z7982 Long term (current) use of aspirin: Secondary | ICD-10-CM | POA: Insufficient documentation

## 2012-05-08 DIAGNOSIS — Z79899 Other long term (current) drug therapy: Secondary | ICD-10-CM | POA: Insufficient documentation

## 2012-05-08 DIAGNOSIS — Z8739 Personal history of other diseases of the musculoskeletal system and connective tissue: Secondary | ICD-10-CM | POA: Insufficient documentation

## 2012-05-08 DIAGNOSIS — Z8639 Personal history of other endocrine, nutritional and metabolic disease: Secondary | ICD-10-CM | POA: Insufficient documentation

## 2012-05-08 DIAGNOSIS — Z862 Personal history of diseases of the blood and blood-forming organs and certain disorders involving the immune mechanism: Secondary | ICD-10-CM | POA: Insufficient documentation

## 2012-05-08 DIAGNOSIS — Y929 Unspecified place or not applicable: Secondary | ICD-10-CM | POA: Insufficient documentation

## 2012-05-08 DIAGNOSIS — Y939 Activity, unspecified: Secondary | ICD-10-CM | POA: Insufficient documentation

## 2012-05-08 DIAGNOSIS — I120 Hypertensive chronic kidney disease with stage 5 chronic kidney disease or end stage renal disease: Secondary | ICD-10-CM | POA: Insufficient documentation

## 2012-05-08 DIAGNOSIS — Z992 Dependence on renal dialysis: Secondary | ICD-10-CM | POA: Insufficient documentation

## 2012-05-08 LAB — CBC
MCH: 32.5 pg (ref 26.0–34.0)
MCHC: 32.4 g/dL (ref 30.0–36.0)
Platelets: 351 10*3/uL (ref 150–400)
RDW: 14.9 % (ref 11.5–15.5)

## 2012-05-08 LAB — BASIC METABOLIC PANEL
Calcium: 10.7 mg/dL — ABNORMAL HIGH (ref 8.4–10.5)
GFR calc Af Amer: 6 mL/min — ABNORMAL LOW (ref >90)
GFR calc non Af Amer: 5 mL/min — ABNORMAL LOW (ref >90)
Potassium: 5.1 mEq/L (ref 3.5–5.1)
Sodium: 137 mEq/L (ref 135–145)

## 2012-05-08 MED ORDER — IOHEXOL 300 MG/ML  SOLN
100.0000 mL | Freq: Once | INTRAMUSCULAR | Status: AC | PRN
  Administered 2012-05-08: 80 mL via INTRAVENOUS

## 2012-05-08 MED ORDER — HYDROCODONE-ACETAMINOPHEN 5-325 MG PO TABS
1.0000 | ORAL_TABLET | Freq: Once | ORAL | Status: AC
  Administered 2012-05-08: 1 via ORAL
  Filled 2012-05-08: qty 1

## 2012-05-08 MED ORDER — HYDROCODONE-ACETAMINOPHEN 5-325 MG PO TABS: 1.0000 | ORAL_TABLET | Freq: Four times a day (QID) | ORAL | Status: DC | PRN

## 2012-05-08 NOTE — ED Notes (Signed)
Pt c/o left sided chest pain. Possible deformity noted to lower left ribs, pt c/o being tender in that area. Pt also c/o pain in left abdomen. Pt alert and oriented at this time.

## 2012-05-08 NOTE — ED Notes (Addendum)
Pt son states he fell on Thursday and was initially c/o L abd and side pain after fall, but today began to state his chest was hurting. Son states daughter gave him tramadol for pain PTA and that normally makes him "less coherent." pt will answer some questions and follow commands. Pt is alert. Dialysis pt, MWF and had full dialysis Friday

## 2012-05-08 NOTE — ED Notes (Signed)
Labs not drawn in triage pt transported to treatment room.

## 2012-05-08 NOTE — ED Provider Notes (Signed)
History     CSN: [id number]  Arrival date & time 05/08/12  1422   First MD Initiated Contact with Patient 05/08/12 1450      Chief Complaint  Patient presents with  . Chest Pain    (Consider location/radiation/quality/duration/timing/severity/associated sxs/prior treatment) Patient is a 79 y.o. male presenting with chest pain. The history is provided by the patient and a relative.  Chest Pain Associated symptoms: no abdominal pain, no back pain, no fever, no headache, no shortness of breath and not vomiting   pt fell in bathroom 2 days ago. At baseline stays in bed/motorized chair, cannot walk on own, can assist w transfers. Was in bathroom on own, fell forward hitting left lower chest, constant pain to area since, worse today. No sob. Also has esrd on hd, mwf, had his last/normal hd per his normal schedule. Pt c/o pain to left lower ribs, upper abd area. Worse w palpation and movement. No other recent cp or discomfort (prior to fall/injury). No cough. No fever. No nv. Denies head injury or loc. No headache. No neck or back pain.      Past Medical History  Diagnosis Date  . Hypertension   . Ulcer   . GERD (gastroesophageal reflux disease)   . CAD (coronary artery disease)     distal LAD occlusion  . Poor circulation   . ESRD on dialysis   . Hyperlipidemia   . Cancer     Skin  . Knee pain, right   . OSA (obstructive sleep apnea)   . PAD (peripheral artery disease)   . IDA (iron deficiency anemia)   . Hyperparathyroidism   . Arthritis     Past Surgical History  Procedure Laterality Date  . Back surgery    . Neck surgery    . Hernia repair      right  . Knee surgery    . Leg surgery    . Shoulder surgery    . Cardiac catheterization  02/06/2005    EF 65-70%.  . Av fistula placement      LEFT ARM  . Cataract extraction, bilateral    . Cholecystectomy    . Transthoracic echocardiogram  04/04/2009    EF 60-65%  . Transthoracic echocardiogram  02/07/2008    EF  55-60%  . Cardiovascular stress test  06/01/2006    EF 55%. NORMAL LV FUNCTION    Family History  Problem Relation Age of Onset  . Stroke Mother   . Heart attack Father   . Stroke Brother   . Stroke Brother   . Stroke Brother     History  Substance Use Topics  . Smoking status: Never Smoker   . Smokeless tobacco: Never Used  . Alcohol Use: No      Review of Systems  Constitutional: Negative for fever.  HENT: Negative for neck pain.   Eyes: Negative for redness.  Respiratory: Negative for shortness of breath.   Cardiovascular: Positive for chest pain.  Gastrointestinal: Negative for vomiting and abdominal pain.  Genitourinary: Negative for flank pain.  Musculoskeletal: Negative for back pain.  Skin: Negative for wound.  Neurological: Negative for headaches.  Hematological: Does not bruise/bleed easily.  Psychiatric/Behavioral: Negative for confusion.    Allergies  Penicillins; Morphine and related; Amoxicillin; Darvocet; Keflex; Neurontin; Ultram; and Vancomycin  Home Medications   Current Outpatient Rx  Name  Route  Sig  Dispense  Refill  . acetaminophen (TYLENOL) 325 MG tablet   Oral   Take 650 mg  by mouth every 6 (six) hours as needed. For pain         . aspirin 81 MG tablet   Oral   Take 81 mg by mouth daily.           . calcium acetate (PHOSLO) 667 MG capsule   Oral   Take 1,334 mg by mouth 3 (three) times daily with meals.         . clindamycin (CLEOCIN) 150 MG capsule   Oral   Take 150 mg by mouth 4 (four) times daily.         . isosorbide mononitrate (IMDUR) 60 MG 24 hr tablet   Oral   Take 60 mg by mouth daily.           Marland Kitchen LIDODERM 5 %      One patch once or twice a week         . metoprolol (LOPRESSOR) 50 MG tablet   Oral   Take 50 mg by mouth once.          . Multiple Vitamin (MULTIVITAMIN) tablet   Oral   Take 1 tablet by mouth daily.           . nitroGLYCERIN (NITROSTAT) 0.4 MG SL tablet   Sublingual   Place 1  tablet (0.4 mg total) under the tongue every 5 (five) minutes as needed.   25 tablet   11   . nystatin ointment (MYCOSTATIN)   Topical   Apply 1 application topically 2 (two) times daily.         Marland Kitchen omeprazole (PRILOSEC) 20 MG capsule   Oral   Take 20 mg by mouth 2 (two) times daily.           . polyethylene glycol (MIRALAX / GLYCOLAX) packet   Oral   Take 17 g by mouth daily.   10 each   0   . simvastatin (ZOCOR) 20 MG tablet   Oral   Take 20 mg by mouth at bedtime.           . sulfamethoxazole-trimethoprim (BACTRIM DS) 800-160 MG per tablet      Apply to feet as needed         . traMADol (ULTRAM) 50 MG tablet   Oral   Take 50 mg by mouth daily.           BP 112/75  Pulse 101  Temp(Src) 98.7 F (37.1 C) (Oral)  Resp 18  SpO2 91%  Physical Exam  Nursing note and vitals reviewed. Constitutional: He appears well-developed and well-nourished. No distress.  HENT:  Head: Atraumatic.  Eyes: Pupils are equal, round, and reactive to light.  Neck: Neck supple. No tracheal deviation present.  Cardiovascular: Normal heart sounds and intact distal pulses.   Irregular, hx chronic afib  Pulmonary/Chest: Effort normal and breath sounds normal. No accessory muscle usage. No respiratory distress. He exhibits tenderness.  Left lower chest wall tenderness reproducing pain  Abdominal: Soft. Bowel sounds are normal. He exhibits no distension and no mass. There is no rebound and no guarding.  Left upper abd tenderness.   Musculoskeletal: Normal range of motion. He exhibits no edema and no tenderness.  Neurological: He is alert.  Alert, hoh, responds to questions, ms c/w baseline per family  Skin: Skin is warm and dry.  Psychiatric: He has a normal mood and affect.    ED Course  Procedures (including critical care time)  Results for orders placed during the hospital encounter  of 05/08/12  CBC      Result Value Range   WBC 13.3 (*) 4.0 - 10.5 K/uL   RBC 3.48 (*)  4.22 - 5.81 MIL/uL   Hemoglobin 11.3 (*) 13.0 - 17.0 g/dL   HCT 47.8 (*) 29.5 - 62.1 %   MCV 100.3 (*) 78.0 - 100.0 fL   MCH 32.5  26.0 - 34.0 pg   MCHC 32.4  30.0 - 36.0 g/dL   RDW 30.8  65.7 - 84.6 %   Platelets 351  150 - 400 K/uL  BASIC METABOLIC PANEL      Result Value Range   Sodium 137  135 - 145 mEq/L   Potassium 5.1  3.5 - 5.1 mEq/L   Chloride 95 (*) 96 - 112 mEq/L   CO2 25  19 - 32 mEq/L   Glucose, Bld 97  70 - 99 mg/dL   BUN 50 (*) 6 - 23 mg/dL   Creatinine, Ser 9.62 (*) 0.50 - 1.35 mg/dL   Calcium 95.2 (*) 8.4 - 10.5 mg/dL   GFR calc non Af Amer 5 (*) >90 mL/min   GFR calc Af Amer 6 (*) >90 mL/min   Dg Ribs Unilateral W/chest Left  05/08/2012  *RADIOLOGY REPORT*  Clinical Data: Fall with left chest and rib pain.  LEFT RIBS AND CHEST - 3+ VIEW  Comparison: 01/01/2012 and prior chest radiographs  Findings: Mild cardiomegaly noted. A left IJ central venous catheters noted with tips overlying the upper SVC. Elevation of the right hemidiaphragm is again noted. There is no evidence of focal airspace disease, pulmonary edema, suspicious pulmonary nodule/mass, pleural effusion, or pneumothorax. No acute bony abnormalities are identified. Multiple remote left-sided rib fractures are identified without definite acute fracture. A vascular stent in the left subclavian/axillary region noted.  IMPRESSION: Cardiomegaly without evidence of acute cardiopulmonary disease.  Multiple remote appearing left rib fractures.  No definite acute fracture identified.   Original Report Authenticated By: Harmon Pier, M.D.    Ct Abdomen Pelvis W Contrast  05/08/2012  *RADIOLOGY REPORT*  Clinical Data: Fall, left lower rib pain and upper abdominal pain.  CT ABDOMEN AND PELVIS WITH CONTRAST  Technique:  Multidetector CT imaging of the abdomen and pelvis was performed following the standard protocol during bolus administration of intravenous contrast.  Contrast: 80mL OMNIPAQUE IOHEXOL 300 MG/ML  SOLN  Comparison:  04/04/2009  Findings: Heart is mildly enlarged.  Coronary artery, aortic and mitral valve calcifications present.  There is left base atelectasis.  No pleural effusions.  Probable nondisplaced fractures through the left lateral sixth and seventh ribs.  No visible pneumothorax.  Prior cholecystectomy.  No focal lesion in the liver.  Areas of decreased enhancement within the spleen.  I favor this is vascular, related to timing of the injection and imaging.  This does not appear to represent a splenic laceration.  No surrounding free fluid or blood.  Pancreas, adrenals are unremarkable.  Kidneys are atrophic with severe cortical atrophy and vascular calcifications. No hydronephrosis.  Aorta and branch vessels are heavily calcified, non-aneurysmal.  No free fluid, free air or adenopathy.  Sigmoid diverticulosis.  Large stool burden throughout the colon.  Small bowel is decompressed. Urinary bladder grossly unremarkable.  No additional acute bony abnormality.  Postoperative changes in the lower lumbar spine.  Grade II anterolisthesis of L5 on S1.  IMPRESSION: Suspect nondisplaced left sixth and seventh lateral rib fractures. Left base atelectasis.  No evidence of solid organ injury.  Borderline heart size.  Sigmoid diverticulosis.  Postoperative changes in the lower lumbar spine.   Original Report Authenticated By: Charlett Nose, M.D.        MDM  Monitor. Cxr. Labs.  Reviewed nursing notes and prior charts for additional history.    Date: 05/08/2012  Rate: 111  Rhythm: atrial fibrillation  QRS Axis: normal  Intervals: afib  ST/T Wave abnormalities: normal  Conduction Disutrbances:afib  Narrative Interpretation:   Old EKG Reviewed: unchanged No acute st/t changes   Pt w marked left lower chest wall/rib tenderness reproducing pain/symptoms.  Pain markedly improved a vicodin 1 po.  Pt comfortable.  On recheck, vitals 109/62, hr 94, rr 16, pulse ox 96% room air.  Discussed xrays w pt/family, they  indicate prior/remote left chest wall injury/rib fx.    6th and 7th rib fractures noted on prior cxr. pts rib/chest wall tenderness lower today.  Also discussed mildly elev ca, pt on ca supplement, will have discuss at f/u at next dialyses.        Suzi Roots, MD 05/08/12 [phone]

## 2012-05-12 ENCOUNTER — Telehealth: Payer: Self-pay | Admitting: Cardiology

## 2012-05-12 NOTE — Telephone Encounter (Signed)
New Problem   Pt's son would like to know if his father can possibly come in today or within the week. Pt c/o chest pain. Appt scheduled with Dr. [place] on 2/25.

## 2012-05-12 NOTE — Telephone Encounter (Signed)
Spoke with patient's son Simona Huh he stated father fell last Thursday 05/05/12.States father complains frequently and is complaining of chest pain off and on.States took father to ER this past Sunday 05/08/12 and was told he was in atrial fib and xrays just revealed bruising from the recent fall.Appointment scheduled with Dr.Jordan 05/17/12.Advised to take father back to ER if needed.

## 2012-05-17 ENCOUNTER — Ambulatory Visit (INDEPENDENT_AMBULATORY_CARE_PROVIDER_SITE_OTHER): Payer: Medicare Other | Admitting: Cardiology

## 2012-05-17 ENCOUNTER — Encounter: Payer: Self-pay | Admitting: Cardiology

## 2012-05-17 VITALS — BP 94/48 | HR 83 | Ht 65.0 in

## 2012-05-17 DIAGNOSIS — I2510 Atherosclerotic heart disease of native coronary artery without angina pectoris: Secondary | ICD-10-CM

## 2012-05-17 DIAGNOSIS — I4891 Unspecified atrial fibrillation: Secondary | ICD-10-CM | POA: Insufficient documentation

## 2012-05-17 DIAGNOSIS — I359 Nonrheumatic aortic valve disorder, unspecified: Secondary | ICD-10-CM

## 2012-05-17 DIAGNOSIS — I739 Peripheral vascular disease, unspecified: Secondary | ICD-10-CM

## 2012-05-17 NOTE — Patient Instructions (Signed)
Continue your current therapy  I will see you in 6 months.   

## 2012-05-17 NOTE — Progress Notes (Signed)
Gerald Tucker Date of Birth: 1919-02-19 Medical Record #[id number]  History of Present Illness: Gerald Tucker is seen for followup today. He was recently seen in the ED for chest pain. Patient had a fall in bathroom 2 days prior and hit his chest. Pain reproduced by palpation. Patient really has no memory of any of this. Thinks someone told him he has a hole in his heart. He does have a history of chronic atrial fibrillation, moderate aortic stenosis, and CAD. He is on dialysis. Memory is very poor.  Current Outpatient Prescriptions on File Prior to Visit  Medication Sig Dispense Refill  . acetaminophen (TYLENOL) 325 MG tablet Take 650 mg by mouth every 6 (six) hours as needed. For pain      . aspirin 81 MG tablet Take 81 mg by mouth daily.        . calcium acetate (PHOSLO) 667 MG capsule Take 1,334 mg by mouth 3 (three) times daily with meals.      . isosorbide mononitrate (IMDUR) 60 MG 24 hr tablet Take 60 mg by mouth daily.        Marland Kitchen LIDODERM 5 % Place 1 patch onto the skin daily. One patch once or twice a week      . metoprolol (LOPRESSOR) 50 MG tablet Take 25 mg by mouth once.       . Multiple Vitamin (MULTIVITAMIN) tablet Take 1 tablet by mouth daily.        . nitroGLYCERIN (NITROSTAT) 0.4 MG SL tablet Place 1 tablet (0.4 mg total) under the tongue every 5 (five) minutes as needed.  25 tablet  11  . nystatin ointment (MYCOSTATIN) Apply 1 application topically 2 (two) times daily.      Marland Kitchen omeprazole (PRILOSEC) 20 MG capsule Take 20 mg by mouth 2 (two) times daily.        . polyethylene glycol (MIRALAX / GLYCOLAX) packet Take 17 g by mouth daily.  10 each  0  . simvastatin (ZOCOR) 20 MG tablet Take 20 mg by mouth at bedtime.        . traMADol (ULTRAM) 50 MG tablet Take 50 mg by mouth daily. For pain       No current facility-administered medications on file prior to visit.    Allergies  Allergen Reactions  . Penicillins   . Morphine And Related     Agitated and sees things  .  Amoxicillin   . Darvocet (Propoxyphene-Acetaminophen)     Agitated and sees things  . Keflex (Cephalexin)   . Neurontin (Gabapentin)     Agitated and confused  . Ultram (Tramadol Hcl)     Agitated and sees things. Can take small amounts  . Vancomycin Itching    Past Medical History  Diagnosis Date  . Hypertension   . Ulcer   . GERD (gastroesophageal reflux disease)   . CAD (coronary artery disease)     distal LAD occlusion  . Poor circulation   . ESRD on dialysis   . Hyperlipidemia   . Cancer     Skin  . Knee pain, right   . OSA (obstructive sleep apnea)   . PAD (peripheral artery disease)   . IDA (iron deficiency anemia)   . Hyperparathyroidism   . Arthritis   . Atrial fibrillation   . Aortic stenosis     Past Surgical History  Procedure Laterality Date  . Back surgery    . Neck surgery    . Hernia repair  right  . Knee surgery    . Leg surgery    . Shoulder surgery    . Cardiac catheterization  02/06/2005    EF 65-70%.  . Av fistula placement      LEFT ARM  . Cataract extraction, bilateral    . Cholecystectomy    . Transthoracic echocardiogram  04/04/2009    EF 60-65%  . Transthoracic echocardiogram  02/07/2008    EF 55-60%  . Cardiovascular stress test  06/01/2006    EF 55%. NORMAL LV FUNCTION    History  Smoking status  . Never Smoker   Smokeless tobacco  . Never Used    History  Alcohol Use No    Family History  Problem Relation Age of Onset  . Stroke Mother   . Heart attack Father   . Stroke Brother   . Stroke Brother   . Stroke Brother     Review of Systems: The review of systems is positive for chronic weakness. His appetite has been poor. He remains on hemodialysis.  Complains of pain below right scapula. Has pain all over. He has nonhealing ulcers on toes. All other systems were reviewed and are negative.  Physical Exam: BP 94/48  Pulse 83  Ht 5\' 5"  (1.651 m)  SpO2 88% He is an elderly, chronically ill-appearing white  male in no acute distress. He is balding. He is normocephalic, atraumatic. Pupils are equal round and reactive to light and accommodation. Extraocular movements are full. Oropharynx is clear. Neck is supple without JVD, adenopathy, thyromegaly, or bruits. Lungs are clear. Cardiac exam reveals a harsh grade 2/6 systolic murmur at the right upper sternal border radiating to left sternal border. Pulse is irregular.He has an AV fistula in his left arm. He has no lower extremity edema. He has diminished pulses in his lower extremities. He is wearing soft boots on both feet. He is alert and oriented x3. LABORATORY DATA: Reviewed from ED includes CXR which showed old rib fractures. Ecg shows Afib with controlled rate. Hbg 11.3. Potassium 5.1.  Assessment / Plan: 1. CAD no clear cut angina. Not a candidate for invasive/interventional treatment given advanced age and comorbidities.Continue current therapy 2. Atrial fibrillation rate is controlled. Not a candidate for anticoagulation due to fall risk. 3. Moderate aortic stenosis 4. ESRD on dialysis. 5. Toe ulcers. Severe bilateral tibial PAD. Not a candidate for intervention. 6. HTN controlled.

## 2012-06-07 ENCOUNTER — Other Ambulatory Visit: Payer: Self-pay | Admitting: *Deleted

## 2012-06-07 ENCOUNTER — Encounter: Payer: Self-pay | Admitting: Vascular Surgery

## 2012-06-07 ENCOUNTER — Ambulatory Visit (INDEPENDENT_AMBULATORY_CARE_PROVIDER_SITE_OTHER): Payer: Medicare Other | Admitting: Vascular Surgery

## 2012-06-07 ENCOUNTER — Encounter (INDEPENDENT_AMBULATORY_CARE_PROVIDER_SITE_OTHER): Payer: Medicare Other | Admitting: *Deleted

## 2012-06-07 VITALS — BP 148/70 | HR 96 | Resp 20 | Ht 70.0 in | Wt 158.0 lb

## 2012-06-07 DIAGNOSIS — I96 Gangrene, not elsewhere classified: Secondary | ICD-10-CM

## 2012-06-07 NOTE — Progress Notes (Signed)
The patient presents today as an add-on for evaluation of gangrene of his left great toe. He also has dry gangrenous changes over the dorsum of his right fourth toe and a blister over his right great toe. This appears to be for partial thickness. He had seen Dr[facility]ikora day [facility]. In reviewing his chart he has seen Dr. Hart Rochester in the past for similar problem. He underwent arteriography for evaluation of his arterial flow in December of 2010. This revealed severe unreconstructable tibial vessel disease at that time. He did have a right foot ulceration which eventually went onto heal after this 2010 event. He does have rest pain most particularly in his left great toe.  Past Medical History  Diagnosis Date  . Hypertension   . Ulcer   . GERD (gastroesophageal reflux disease)   . CAD (coronary artery disease)     distal LAD occlusion  . Poor circulation   . ESRD on dialysis   . Hyperlipidemia   . Cancer     Skin  . Knee pain, right   . OSA (obstructive sleep apnea)   . PAD (peripheral artery disease)   . IDA (iron deficiency anemia)   . Hyperparathyroidism   . Arthritis   . Atrial fibrillation   . Aortic stenosis     History  Substance Use Topics  . Smoking status: Never Smoker   . Smokeless tobacco: Never Used  . Alcohol Use: No    Family History  Problem Relation Age of Onset  . Stroke Mother   . Heart attack Father   . Stroke Brother   . Stroke Brother   . Stroke Brother     Allergies  Allergen Reactions  . Penicillins   . Morphine And Related     Agitated and sees things  . Amoxicillin   . Darvocet (Propoxyphene-Acetaminophen)     Agitated and sees things  . Keflex (Cephalexin)   . Neurontin (Gabapentin)     Agitated and confused  . Ultram (Tramadol Hcl)     Agitated and sees things. Can take small amounts  . Vancomycin Itching    Current outpatient prescriptions:acetaminophen (TYLENOL) 325 MG tablet, Take 650 mg by mouth every 6 (six) hours as  needed. For pain, Disp: , Rfl: ;  aspirin 81 MG tablet, Take 81 mg by mouth daily.  , Disp: , Rfl: ;  B Complex-C-Folic Acid (DIALYVITE PO), Take by mouth., Disp: , Rfl: ;  Cadexomer Iodine (IODOSORB EX), Apply topically., Disp: , Rfl:  calcium acetate (PHOSLO) 667 MG capsule, Take 1,334 mg by mouth 3 (three) times daily with meals., Disp: , Rfl: ;  clindamycin (CLEOCIN) 150 MG capsule, , Disp: , Rfl: ;  CLOTRIMAZOLE-BETAMETHASONE EX, Apply topically., Disp: , Rfl: ;  isosorbide mononitrate (IMDUR) 60 MG 24 hr tablet, Take 60 mg by mouth daily.  , Disp: , Rfl: ;  LIDODERM 5 %, Place 1 patch onto the skin daily. One patch once or twice a week, Disp: , Rfl:  metoprolol (LOPRESSOR) 50 MG tablet, Take 25 mg by mouth once. , Disp: , Rfl: ;  Multiple Vitamin (MULTIVITAMIN) tablet, Take 1 tablet by mouth daily.  , Disp: , Rfl: ;  nitroGLYCERIN (NITROSTAT) 0.4 MG SL tablet, Place 1 tablet (0.4 mg total) under the tongue every 5 (five) minutes as needed., Disp: 25 tablet, Rfl: 11;  nystatin ointment (MYCOSTATIN), Apply 1 application topically 2 (two) times daily., Disp: , Rfl:  omeprazole (PRILOSEC) 20 MG capsule, Take 20  mg by mouth 2 (two) times daily.  , Disp: , Rfl: ;  polyethylene glycol (MIRALAX / GLYCOLAX) packet, Take 17 g by mouth daily., Disp: 10 each, Rfl: 0;  simvastatin (ZOCOR) 20 MG tablet, Take 20 mg by mouth at bedtime.  , Disp: , Rfl: ;  traMADol (ULTRAM) 50 MG tablet, Take 50 mg by mouth daily. For pain, Disp: , Rfl:   BP 148/70  Pulse 96  Resp 20  Ht 5\' 10"  (1.778 m)  Wt 158 lb (71.668 kg)  BMI 22.67 kg/m2  Body mass index is 22.67 kg/(m^2).       Physical exam a well-developed alert gentleman sitting in a wheelchair. He is here today with his son. He does have palpable popliteal pulses bilaterally. He does have some erythema on his left foot and dry gangrene of his entire left great toe.  Noninvasive vascular lab studies today reveal ankle arm index of 0.58 on the right and 0.56 on  the left with monophasic waveforms.  Had a long discussion with the patient and his son. I've also discussed this with Dr.Sikora telephone. I feel that he is certainly at risk for nonhealing toe amputation. I would recommend proceeding with attempt of this knowing that he has nonhealing that he would require below-knee amputation. With normal flow to the popliteal level I feel that he should have a very high chance of healing a below-knee amputation on the left. I explained the alternative would be a primary low knee amputation but this obviously would markedly change his quality of life. He is able to transfer from bed to chair and take a few cells with a walker currently which would not be possible with a below-knee amputation. He is in agreement with this plan and will schedule toe amputation with Dr.Sikora. We will be available for any further problems with healing.

## 2012-06-21 ENCOUNTER — Inpatient Hospital Stay (HOSPITAL_COMMUNITY): Payer: Medicare Other

## 2012-06-21 ENCOUNTER — Encounter (HOSPITAL_COMMUNITY): Payer: Self-pay | Admitting: *Deleted

## 2012-06-21 ENCOUNTER — Emergency Department (HOSPITAL_COMMUNITY): Payer: Medicare Other

## 2012-06-21 ENCOUNTER — Inpatient Hospital Stay (HOSPITAL_COMMUNITY)
Admission: EM | Admit: 2012-06-21 | Discharge: 2012-06-29 | DRG: 474 | Disposition: A | Discharge status: Skilled Nursing Facility | Payer: Medicare Other | Attending: Internal Medicine | Admitting: Internal Medicine

## 2012-06-21 DIAGNOSIS — T8789 Other complications of amputation stump: Principal | ICD-10-CM | POA: Diagnosis present

## 2012-06-21 DIAGNOSIS — N186 End stage renal disease: Secondary | ICD-10-CM | POA: Diagnosis present

## 2012-06-21 DIAGNOSIS — M86179 Other acute osteomyelitis, unspecified ankle and foot: Secondary | ICD-10-CM | POA: Diagnosis present

## 2012-06-21 DIAGNOSIS — K5909 Other constipation: Secondary | ICD-10-CM | POA: Diagnosis present

## 2012-06-21 DIAGNOSIS — L97509 Non-pressure chronic ulcer of other part of unspecified foot with unspecified severity: Secondary | ICD-10-CM | POA: Diagnosis present

## 2012-06-21 DIAGNOSIS — K5904 Chronic idiopathic constipation: Secondary | ICD-10-CM | POA: Diagnosis present

## 2012-06-21 DIAGNOSIS — T814XXA Infection following a procedure, initial encounter: Secondary | ICD-10-CM

## 2012-06-21 DIAGNOSIS — I96 Gangrene, not elsewhere classified: Secondary | ICD-10-CM

## 2012-06-21 DIAGNOSIS — Z79899 Other long term (current) drug therapy: Secondary | ICD-10-CM

## 2012-06-21 DIAGNOSIS — G4733 Obstructive sleep apnea (adult) (pediatric): Secondary | ICD-10-CM | POA: Diagnosis present

## 2012-06-21 DIAGNOSIS — I359 Nonrheumatic aortic valve disorder, unspecified: Secondary | ICD-10-CM | POA: Diagnosis present

## 2012-06-21 DIAGNOSIS — I679 Cerebrovascular disease, unspecified: Secondary | ICD-10-CM | POA: Diagnosis present

## 2012-06-21 DIAGNOSIS — I4891 Unspecified atrial fibrillation: Secondary | ICD-10-CM | POA: Diagnosis present

## 2012-06-21 DIAGNOSIS — E785 Hyperlipidemia, unspecified: Secondary | ICD-10-CM | POA: Diagnosis present

## 2012-06-21 DIAGNOSIS — Z89512 Acquired absence of left leg below knee: Secondary | ICD-10-CM

## 2012-06-21 DIAGNOSIS — I10 Essential (primary) hypertension: Secondary | ICD-10-CM | POA: Diagnosis present

## 2012-06-21 DIAGNOSIS — D509 Iron deficiency anemia, unspecified: Secondary | ICD-10-CM | POA: Diagnosis present

## 2012-06-21 DIAGNOSIS — Z880 Allergy status to penicillin: Secondary | ICD-10-CM

## 2012-06-21 DIAGNOSIS — M86172 Other acute osteomyelitis, left ankle and foot: Secondary | ICD-10-CM

## 2012-06-21 DIAGNOSIS — M79609 Pain in unspecified limb: Secondary | ICD-10-CM

## 2012-06-21 DIAGNOSIS — Z992 Dependence on renal dialysis: Secondary | ICD-10-CM

## 2012-06-21 DIAGNOSIS — N2581 Secondary hyperparathyroidism of renal origin: Secondary | ICD-10-CM | POA: Diagnosis present

## 2012-06-21 DIAGNOSIS — Z7982 Long term (current) use of aspirin: Secondary | ICD-10-CM

## 2012-06-21 DIAGNOSIS — M129 Arthropathy, unspecified: Secondary | ICD-10-CM | POA: Diagnosis present

## 2012-06-21 DIAGNOSIS — K219 Gastro-esophageal reflux disease without esophagitis: Secondary | ICD-10-CM | POA: Diagnosis present

## 2012-06-21 DIAGNOSIS — I739 Peripheral vascular disease, unspecified: Secondary | ICD-10-CM

## 2012-06-21 DIAGNOSIS — I2510 Atherosclerotic heart disease of native coronary artery without angina pectoris: Secondary | ICD-10-CM | POA: Diagnosis present

## 2012-06-21 DIAGNOSIS — M869 Osteomyelitis, unspecified: Secondary | ICD-10-CM

## 2012-06-21 DIAGNOSIS — I120 Hypertensive chronic kidney disease with stage 5 chronic kidney disease or end stage renal disease: Secondary | ICD-10-CM | POA: Diagnosis present

## 2012-06-21 DIAGNOSIS — Y835 Amputation of limb(s) as the cause of abnormal reaction of the patient, or of later complication, without mention of misadventure at the time of the procedure: Secondary | ICD-10-CM | POA: Diagnosis present

## 2012-06-21 LAB — BASIC METABOLIC PANEL WITH GFR
BUN: 43 mg/dL — ABNORMAL HIGH (ref 6–23)
GFR calc Af Amer: 8 mL/min — ABNORMAL LOW (ref >90)
GFR calc non Af Amer: 7 mL/min — ABNORMAL LOW (ref >90)
Potassium: 4.3 meq/L (ref 3.5–5.1)
Sodium: 137 meq/L (ref 135–145)

## 2012-06-21 LAB — CBC WITH DIFFERENTIAL/PLATELET
Basophils Absolute: 0 K/uL (ref 0.0–0.1)
Basophils Relative: 0 % (ref 0–1)
Eosinophils Absolute: 0.1 K/uL (ref 0.0–0.7)
Eosinophils Relative: 0 % (ref 0–5)
HCT: 28.5 % — ABNORMAL LOW (ref 39.0–52.0)
Hemoglobin: 9.4 g/dL — ABNORMAL LOW (ref 13.0–17.0)
Lymphocytes Relative: 5 % — ABNORMAL LOW (ref 12–46)
Lymphs Abs: 0.9 10*3/uL (ref 0.7–4.0)
MCH: 32.3 pg (ref 26.0–34.0)
MCHC: 33 g/dL (ref 30.0–36.0)
MCV: 97.9 fL (ref 78.0–100.0)
Monocytes Absolute: 1.4 10*3/uL — ABNORMAL HIGH (ref 0.1–1.0)
Monocytes Relative: 8 % (ref 3–12)
Neutro Abs: 15.6 10*3/uL — ABNORMAL HIGH (ref 1.7–7.7)
Neutrophils Relative %: 87 % — ABNORMAL HIGH (ref 43–77)
Platelets: 382 K/uL (ref 150–400)
RBC: 2.91 MIL/uL — ABNORMAL LOW (ref 4.22–5.81)
RDW: 14.8 % (ref 11.5–15.5)
WBC: 17.9 10*3/uL — ABNORMAL HIGH (ref 4.0–10.5)

## 2012-06-21 LAB — BASIC METABOLIC PANEL
CO2: 26 mEq/L (ref 19–32)
Calcium: 9.1 mg/dL (ref 8.4–10.5)
Chloride: 97 mEq/L (ref 96–112)
Creatinine, Ser: 6.2 mg/dL — ABNORMAL HIGH (ref 0.50–1.35)
Glucose, Bld: 115 mg/dL — ABNORMAL HIGH (ref 70–99)

## 2012-06-21 MED ORDER — ASPIRIN 81 MG PO CHEW
81.0000 mg | CHEWABLE_TABLET | Freq: Every day | ORAL | Status: DC
  Administered 2012-06-23 – 2012-06-29 (×7): 81 mg via ORAL
  Filled 2012-06-21 (×7): qty 1

## 2012-06-21 MED ORDER — FENTANYL CITRATE 0.05 MG/ML IJ SOLN
12.5000 ug | INTRAMUSCULAR | Status: DC | PRN
  Administered 2012-06-22 – 2012-06-23 (×5): 25 ug via INTRAVENOUS
  Administered 2012-06-24 – 2012-06-25 (×3): 12.5 ug via INTRAVENOUS
  Administered 2012-06-26: 25 ug via INTRAVENOUS
  Administered 2012-06-27: 12.5 ug via INTRAVENOUS
  Filled 2012-06-21 (×10): qty 2

## 2012-06-21 MED ORDER — POLYETHYLENE GLYCOL 3350 17 G PO PACK
17.0000 g | PACK | Freq: Every day | ORAL | Status: DC
  Administered 2012-06-21 – 2012-06-27 (×7): 17 g via ORAL
  Filled 2012-06-21 (×8): qty 1

## 2012-06-21 MED ORDER — PANTOPRAZOLE SODIUM 40 MG PO TBEC
40.0000 mg | DELAYED_RELEASE_TABLET | Freq: Every day | ORAL | Status: DC
  Administered 2012-06-21 – 2012-06-29 (×8): 40 mg via ORAL
  Filled 2012-06-21 (×6): qty 1

## 2012-06-21 MED ORDER — METOPROLOL TARTRATE 50 MG PO TABS
50.0000 mg | ORAL_TABLET | Freq: Two times a day (BID) | ORAL | Status: DC
  Administered 2012-06-21 – 2012-06-29 (×14): 50 mg via ORAL
  Filled 2012-06-21 (×18): qty 1

## 2012-06-21 MED ORDER — ASPIRIN 81 MG PO TABS: 81.0000 mg | ORAL_TABLET | Freq: Every day | ORAL | Status: DC

## 2012-06-21 MED ORDER — SODIUM CHLORIDE 0.9 % IV BOLUS (SEPSIS)
500.0000 mL | Freq: Once | INTRAVENOUS | Status: AC
  Administered 2012-06-21: 500 mL via INTRAVENOUS

## 2012-06-21 MED ORDER — ENOXAPARIN SODIUM 30 MG/0.3ML ~~LOC~~ SOLN
30.0000 mg | Freq: Every day | SUBCUTANEOUS | Status: DC
  Administered 2012-06-22 – 2012-06-28 (×7): 30 mg via SUBCUTANEOUS
  Filled 2012-06-21 (×10): qty 0.3

## 2012-06-21 MED ORDER — SENNA 8.6 MG PO TABS
1.0000 | ORAL_TABLET | Freq: Two times a day (BID) | ORAL | Status: DC
  Administered 2012-06-21 – 2012-06-29 (×14): 8.6 mg via ORAL
  Filled 2012-06-21 (×17): qty 1

## 2012-06-21 MED ORDER — RENA-VITE PO TABS
1.0000 | ORAL_TABLET | Freq: Every day | ORAL | Status: DC
  Administered 2012-06-21 – 2012-06-28 (×7): 1 via ORAL
  Filled 2012-06-21 (×9): qty 1

## 2012-06-21 MED ORDER — LEVOFLOXACIN IN D5W 500 MG/100ML IV SOLN: 500.0000 mg | INTRAVENOUS | Status: DC

## 2012-06-21 MED ORDER — LEVOFLOXACIN IN D5W 750 MG/150ML IV SOLN
750.0000 mg | Freq: Once | INTRAVENOUS | Status: AC
  Administered 2012-06-21: 750 mg via INTRAVENOUS
  Filled 2012-06-21: qty 150

## 2012-06-21 MED ORDER — SIMVASTATIN 20 MG PO TABS
20.0000 mg | ORAL_TABLET | Freq: Every day | ORAL | Status: DC
  Administered 2012-06-21 – 2012-06-28 (×7): 20 mg via ORAL
  Filled 2012-06-21 (×9): qty 1

## 2012-06-21 MED ORDER — ACETAMINOPHEN 325 MG PO TABS
650.0000 mg | ORAL_TABLET | Freq: Four times a day (QID) | ORAL | Status: DC | PRN
  Administered 2012-06-23 – 2012-06-29 (×7): 650 mg via ORAL
  Filled 2012-06-21 (×7): qty 2

## 2012-06-21 MED ORDER — CALCIUM ACETATE 667 MG PO CAPS
667.0000 mg | ORAL_CAPSULE | Freq: Three times a day (TID) | ORAL | Status: DC
  Administered 2012-06-22 – 2012-06-29 (×15): 667 mg via ORAL
  Filled 2012-06-21 (×25): qty 1

## 2012-06-21 MED ORDER — FENTANYL CITRATE 0.05 MG/ML IJ SOLN
25.0000 ug | INTRAMUSCULAR | Status: DC | PRN
  Administered 2012-06-21 (×2): 25 ug via INTRAVENOUS
  Filled 2012-06-21: qty 2

## 2012-06-21 NOTE — ED Notes (Addendum)
Pt was sent here by his surgeon, Dr Ralene Cork, to R/O infection to L foot post L great toe amputation.  Redness and purulent drainage to surgical site.  Please page Dr Arbie Cookey when pt is roomed.

## 2012-06-21 NOTE — Consult Note (Signed)
[facility] Consult  Reason for Consult:  osteomyelitis left foot Referring Physician:  ED  [id number]  History of Present Illness: This is a 79 y.o. male who has a long standing hx of PVD.  He saw Dr. Arbie Cookey on 06/07/12 and at that time, it was recommended that he undergo a left BKA.  He also has dry gangrenous changes over the dorsum of his right fourth toe and a blister over his right great toe. This appears to be for partial thickness. He had [facility] Dr[facility] day [facility]. In reviewing his chart he has seen Dr. Hart Rochester in the past for similar problem.   He underwent arteriography for evaluation of his arterial flow in December 2010 and this revealed severer unreconstructable tibial vessel disease at that time.  He did have a aright foot ulceration, which eventually went on to heal after this 2010 event.  He continued to have rest pain particularly in his left great toe.  At the office visit with Dr. Arbie Cookey, he had a long discussion with the pt and his son as well as a discussion with Dr. Ralene Cork via telephone.  At that time, it was felt that the pt was certainly at risk for not healing his toe amputation. The patient understood that if this did not heal he would potentially need a more proximal amputation. With normal flow to the popliteal level he would likely have a very high chance of healing a below-knee amputation on the left. Dr. Arbie Cookey explained the alternative would be a primary low knee amputation but this obviously would markedly change his quality of life. He is able to transfer from bed to chair and take a few cells with a walker currently which would not be possible with a below-knee amputation. The patient elected to proceed with toe amputation by Dr.Sikora. He was seen today and found that the toe amputation has not healed. He continues to have significant pain.  He was sent to the ED today with complaints of foot pain for IV antibiotics and further evaluation..  Due to a foul odor, there was some concern for ongoing and worsening infection despite him taking clindamycin at home. Patient denies fever chills, shortness of breath or vomiting.     Past Medical History  Diagnosis Date  . Hypertension   . Ulcer   . GERD (gastroesophageal reflux disease)   . CAD (coronary artery disease)     distal LAD occlusion  . Poor circulation   . ESRD on dialysis   . Hyperlipidemia   . Cancer     Skin  . Knee pain, right   . OSA (obstructive sleep apnea)   . PAD (peripheral artery disease)   . IDA (iron deficiency anemia)   . Hyperparathyroidism   . Arthritis   . Atrial fibrillation   . Aortic stenosis    Past Surgical History  Procedure Laterality Date  . Back surgery    . Neck surgery    . Hernia repair      right  . Knee surgery    . Leg surgery    . Shoulder surgery    . Cardiac catheterization  02/06/2005    EF 65-70%.  . Av fistula placement      LEFT ARM  . Cataract extraction, bilateral    . Cholecystectomy    . Transthoracic echocardiogram  04/04/2009    EF 60-65%  . Transthoracic echocardiogram  02/07/2008    EF 55-60%  . Cardiovascular  stress test  06/01/2006    EF 55%. NORMAL LV FUNCTION    Allergies  Allergen Reactions  . Penicillins   . Morphine And Related     Agitated and sees things  . Amoxicillin   . Darvocet (Propoxyphene-Acetaminophen)     Agitated and sees things  . Keflex (Cephalexin) Other (See Comments)    Unknown   . Neurontin (Gabapentin)     Agitated and confused  . Ultram (Tramadol Hcl)     Agitated and sees things. Can take small amounts  . Vancomycin Itching    Prior to Admission medications   Medication Sig Start Date End Date Taking? Authorizing Provider  acetaminophen (TYLENOL) 325 MG tablet Take 650 mg by mouth every 6 (six) hours as needed. For pain   Yes Historical Provider, MD  aspirin 81 MG tablet Take 81 mg by mouth daily.     Yes Historical Provider, MD  B Complex-C-Folic Acid (DIALYVITE  PO) Take by mouth.   Yes Historical Provider, MD  calcium acetate (PHOSLO) 667 MG capsule Take 667 mg by mouth 3 (three) times daily with meals.    Yes Historical Provider, MD  clindamycin (CLEOCIN) 150 MG capsule Take 150 mg by mouth 2 (two) times daily.  02/23/12  Yes Historical Provider, MD  isosorbide mononitrate (IMDUR) 60 MG 24 hr tablet Take 60 mg by mouth daily.     Yes Historical Provider, MD  LIDODERM 5 % Place 1 patch onto the skin daily. One patch once or twice a week 11/27/11  Yes Historical Provider, MD  metoprolol (LOPRESSOR) 50 MG tablet Take 50 mg by mouth 2 (two) times daily.    Yes Historical Provider, MD  nitroGLYCERIN (NITROSTAT) 0.4 MG SL tablet Place 1 tablet (0.4 mg total) under the tongue every 5 (five) minutes as needed. 03/12/11  Yes Peter M [place], MD  nystatin ointment (MYCOSTATIN) Apply 1 application topically 2 (two) times daily.   Yes Historical Provider, MD  omeprazole (PRILOSEC) 20 MG capsule Take 20 mg by mouth 2 (two) times daily.     Yes Historical Provider, MD  polyethylene glycol (MIRALAX / GLYCOLAX) packet Take 17 g by mouth daily. 01/04/12  Yes Nishant Dhungel, MD  simvastatin (ZOCOR) 20 MG tablet Take 20 mg by mouth at bedtime.     Yes Historical Provider, MD  traMADol (ULTRAM) 50 MG tablet Take 50 mg by mouth 2 (two) times daily. For pain   Yes Historical Provider, MD    History   Social History  . Marital Status: Widowed    Spouse Name: N/A    Number of Children: 4  . Years of Education: N/A   Occupational History  . Sold Hardware-Retired    Social History Main Topics  . Smoking status: Never Smoker   . Smokeless tobacco: Never Used  . Alcohol Use: No  . Drug Use: No  . Sexually Active: Not on file   Other Topics Concern  . Not on file   Social History Narrative  . No narrative on file    Family History  Problem Relation Age of Onset  . Stroke Mother   . Heart attack Father   . Stroke Brother   . Stroke Brother   . Stroke Brother      ROS: [x]  Positive   [ ]  Negative   [ ]  All sytems reviewed and are negative  General: [ ]  Weight loss, [ ]  Weight gain, [ ]   Loss of appetite, [ ]  Fever Neurologic: [ ]   Dizziness, [ ]  Blackouts, [ ]  Headaches, [ ]  Seizure, Stroke, [ ]  "Mini stroke", [ ]  Slurred speech, [ ]  Temporary blindness Ear/Nose/Throat: [ ]  Change in eyesight, [ ]  Change in hearing, [ ]  Nose bleeds, [ ]  Sore throat Vascular: [ ]  Pain in legs with walking, [ ]  Pain in feet while lying flat, [ ]  Non-healing ulcer,  [ ]  Blood clot in vein, [ ]  Phlebitis Pulmonary: [ ]  Home oxygen, [ ]  Productive cough, [ ]  Bronchitis, [ ]  Coughing up blood, [x]  OSA [ ]  Asthma, [ ]  Wheezing Musculoskeletal: [ ]  Arthritis, [ ]  Joint pain, [ ]  Muscle pain Cardiac: [ ]  Chest pain, [ ]  Chest tightness/pressure, [ ]  Shortness of breath when lying flat, [ ]  Shortness of breath with exertion, [ ]  Palpitations, [ ]  Heart murmur, [ ]  Arrythmia, [x]  HTN [x ] Atrial fibrillation Hematologic: [ ]  Bleeding problems, [ ]  Clotting disorder, [ ]  Anemia; [x]  iron deficiency anemia Psychiatric:  [ ]  Depression, [ ]  Anxiety Gastrointestinal:  [ ]  Black stool,[ ]   Blood in stool, [x]  GERD [ ]  Peptic ulcer disease, [ ]  Reflux, [ ]  Hiatal hernia, [ ]  Trouble swallowing, [ ]  Diarrhea, [ ]  Constipation Urinary:  [x]  Kidney disease, [ ]  Burning with urination, [ ]  nocturia, [ ]  Difficulty urinating Endocrine: [ ]  hx diabetes, [ ]  hx thyroid disease Skin: [ ]  Ulcers, [ ]  Rashes   Physical Examination  Filed Vitals:   06/21/12 1515  BP:   Pulse: 90  Temp:   Resp: 20   Body mass index is 22.63 kg/(m^2).  General:  WDWN in NAD Gait: Normal HENT: WNL Eyes: Pupils equal Pulmonary: normal non-labored breathing , without Rales, rhonchi,  wheezing Cardiac: RRR, without  Murmurs, rubs or gallops; No carotid bruits Abdomen: soft, NT, no masses Skin: Nonhealing left great toe amputation. I performed a dressing change. He has erythema extending up onto the  dorsum of his foot. There are ischemic changes to the tissue at the level of the amputation  Vascular Exam/Pulses: Pedal pulses are not palpable Extremities: without ischemic changes, no Gangrene , no cellulitis; no open wounds;  Musculoskeletal: no muscle wasting or atrophy  Neurologic: A&O X 3; Appropriate Affect. Speech is fluent/normal. Patient is hard of hearing   CBC    Component Value Date/Time   WBC 17.9* 06/21/2012 1329   RBC 2.91* 06/21/2012 1329   HGB 9.4* 06/21/2012 1329   HCT 28.5* 06/21/2012 1329   PLT 382 06/21/2012 1329   MCV 97.9 06/21/2012 1329   MCH 32.3 06/21/2012 1329   MCHC 33.0 06/21/2012 1329   RDW 14.8 06/21/2012 1329   LYMPHSABS 0.9 06/21/2012 1329   MONOABS 1.4* 06/21/2012 1329   EOSABS 0.1 06/21/2012 1329   BASOSABS 0.0 06/21/2012 1329    BMET    Component Value Date/Time   NA 137 06/21/2012 1329   K 4.3 06/21/2012 1329   CL 97 06/21/2012 1329   CO2 26 06/21/2012 1329   GLUCOSE 115* 06/21/2012 1329   BUN 43* 06/21/2012 1329   CREATININE 6.20* 06/21/2012 1329   CALCIUM 9.1 06/21/2012 1329   CALCIUM 9.1 04/01/2009 2202   GFRNONAA 7* 06/21/2012 1329   GFRAA 8* 06/21/2012 1329     Non-Invasive Vascular Imaging:  None at this time   ASSESSMENT/PLAN: This is a 79 y.o. male who underwent left great toe amputation earlier this month and presents today with non healing wound with leukocytosis. He is an ESRD pt on HD  -  admit pt to medicine service -pt will be started on IV ABx -I discussed with the son and the patient that he will most likely require a more proximal amputation (below knee amputation). He would like to continue with wound care to see if this improves. I do not think this will happen. We discussed the possibility of palliative care as well. We will continue to follow this patient while he is in the hospital   Doreatha Massed, PA-C [facility] [phone]

## 2012-06-21 NOTE — Progress Notes (Addendum)
Pt admitted to unit 6700 from the ED. Received report from [place], RN in the ED. Patient arrived to unit 6700 at 19:00. Pt is alert and oriented. Vital Signs are WNL. Patient is familiarized to staff, call bell, and room. Pt's family is at his bedside.  Bed in lowest position. Call bell within reach. Report given to night shift nurse on 6700.  [name], RN

## 2012-06-21 NOTE — ED Notes (Signed)
Pt transported to xray 

## 2012-06-21 NOTE — H&P (Signed)
Hospital Admission Note Date: 06/21/2012  Patient name: Gerald Tucker Medical record number: [id number] Date of birth: 03/19/1919 Age: 79 y.o. Gender: male PCP: Cecille Aver, MD  _________________________________________________________ INTERNAL MEDICINE TEACHING SERVICE CONTACT INFO     Weekday Hours (7AM-5PM): ** If no return call within 15 minutes (after trying both pagers listed below), please call after hours pagers.    First Contact:  Dr. Collier Bullock   Pager:  [phone] Second Contact:   Dr. Manson Passey   Pager:  [phone]      After Hours (after 5PM)/ Weekend / Holidays: First Contact:              Pager: [phone] Second Contact:         Pager: [phone] _________________________________________________________   Chief Complaint: Left foot pain  History of Present Illness:  Gerald Tucker is a 79 year old gentleman with a history of ESRD on HD (MWF), atrial fibrillation, and peripheral vascular disease with recent amputation of gangrenous left great toe one week ago. He presents directly from his surgeon's office where he was seen for a followup visit and directed to come to the ED for evaluation of pain, swelling and purulent discharge from his left foot wound.  Gerald Tucker presented on 06/07/2012 with worsening foot ulcers and it was recommended that he undergo a left BKA at that time. The patient wanted to go foot sparing surgery and so a partial amputation was performed. Patient did well after the surgery except for some pain which was controlled with oxycodone and tramadol. Patient's family states that he got a bit sedated with oxycodone and they switched him to tramadol on Sunday. Patient states that he has had more severe pain, "like before the surgery," over the last 2-3 days. He denies having any fever or chills but is frequently cold at home. His son did notice a foul smell coming from his foot on Sunday. At his second postop visit the day of presentation, he was noted to have increased  swelling and purulent drainage from the wound. According to patient's son, the wound was opened in the office and redressed with gauze. The patient was sent to the ED from the office for further evaluation and treatment.  At baseline, the patient is alert and oriented, conversant, and lives with his daughter. He does require help with dressing, cooking, bathing, and mobility.  Patient's son is his health care power of attorney who has a copy of his living will.  He has been taking PO clindamycin at home both before and after the surgery.  ROS + for constipation.    Meds:   Medication List    ASK your doctor about these medications       acetaminophen 325 MG tablet  Commonly known as:  TYLENOL  Take 650 mg by mouth every 6 (six) hours as needed. For pain     aspirin 81 MG tablet  Take 81 mg by mouth daily.     calcium acetate 667 MG capsule  Commonly known as:  PHOSLO  Take 667 mg by mouth 3 (three) times daily with meals.     clindamycin 150 MG capsule  Commonly known as:  CLEOCIN  Take 150 mg by mouth 2 (two) times daily.     DIALYVITE PO  Take by mouth.     isosorbide mononitrate 60 MG 24 hr tablet  Commonly known as:  IMDUR  Take 60 mg by mouth daily.     LIDODERM 5 %  Generic drug:  lidocaine  Place 1 patch onto the skin daily. One patch once or twice a week     metoprolol 50 MG tablet  Commonly known as:  LOPRESSOR  Take 50 mg by mouth 2 (two) times daily.     nitroGLYCERIN 0.4 MG SL tablet  Commonly known as:  NITROSTAT  Place 1 tablet (0.4 mg total) under the tongue every 5 (five) minutes as needed.     nystatin ointment  Commonly known as:  MYCOSTATIN  Apply 1 application topically 2 (two) times daily.     omeprazole 20 MG capsule  Commonly known as:  PRILOSEC  Take 20 mg by mouth 2 (two) times daily.     polyethylene glycol packet  Commonly known as:  MIRALAX / GLYCOLAX  Take 17 g by mouth daily.     simvastatin 20 MG tablet  Commonly known  as:  ZOCOR  Take 20 mg by mouth at bedtime.     traMADol 50 MG tablet  Commonly known as:  ULTRAM  Take 50 mg by mouth 2 (two) times daily. For pain        Allergies: Allergies as of 06/21/2012 - Review Complete 06/21/2012  Allergen Reaction Noted  . Penicillins  09/10/2010  . Morphine and related  09/10/2010  . Amoxicillin  08/27/2011  . Darvocet (propoxyphene-acetaminophen)  08/27/2011  . Keflex (cephalexin) Other (See Comments) 08/27/2011  . Neurontin (gabapentin)  08/27/2011  . Ultram (tramadol hcl)  09/10/2010  . Vancomycin Itching 01/28/2012   Past Medical History  Diagnosis Date  . Hypertension   . Ulcer   . GERD (gastroesophageal reflux disease)   . CAD (coronary artery disease)     distal LAD occlusion  . Poor circulation   . ESRD on dialysis   . Hyperlipidemia   . Cancer     Skin  . Knee pain, right   . OSA (obstructive sleep apnea)   . PAD (peripheral artery disease)   . IDA (iron deficiency anemia)   . Hyperparathyroidism   . Arthritis   . Atrial fibrillation   . Aortic stenosis    Past Surgical History  Procedure Laterality Date  . Back surgery    . Neck surgery    . Hernia repair      right  . Knee surgery    . Leg surgery    . Shoulder surgery    . Cardiac catheterization  02/06/2005    EF 65-70%.  . Av fistula placement      LEFT ARM  . Cataract extraction, bilateral    . Cholecystectomy    . Transthoracic echocardiogram  04/04/2009    EF 60-65%  . Transthoracic echocardiogram  02/07/2008    EF 55-60%  . Cardiovascular stress test  06/01/2006    EF 55%. NORMAL LV FUNCTION   Family History  Problem Relation Age of Onset  . Stroke Mother   . Heart attack Father   . Stroke Brother   . Stroke Brother   . Stroke Brother    History   Social History  . Marital Status: Widowed    Spouse Name: N/A    Number of Children: 4  . Years of Education: N/A   Occupational History  . Sold Hardware-Retired    Social History Main Topics  .  Smoking status: Never Smoker   . Smokeless tobacco: Never Used  . Alcohol Use: No  . Drug Use: No  . Sexually Active: Not on file   Other Topics Concern  . Not  on file   Social History Narrative  . No narrative on file    Review of Systems: Pertinent items noted in HPI   Physical Exam Blood pressure 106/61, pulse 90, temperature 98.9 F (37.2 C), temperature source Oral, resp. rate 20, height 5' 10.08" (1.78 m), weight 158 lb 1.1 oz (71.7 kg), SpO2 98.00%. General:  No acute distress, alert and oriented x 3, appears stated age HEENT:  PERRL, EOMI, moist mucous membranes Cardiovascular:  Irregular rhythm, rate in the 80s, soft blowing systolic murmur Respiratory:  Clear to auscultation bilaterally, no wheezes, rales, or rhonchi Abdomen:  Soft, nondistended, nontender, bowel sounds present Extremities:  Left upper extremity with aVF. Left lower extremity slightly cool, erythematous, wrapped in gauze but necrotic tissue visible around dressing. 2-3+ pitting edema. Barely palpable pulses. Reviewed photo of wound from office visit today which showed purulent drainage from the open wound bed, some sutures still in place, necrotic areas surround the wound. RLE with dry gangrene over R 4th toe and blister over R great toe. Neuro: Not anxious appearing, no depressed mood, normal affect.   Lab results: Basic Metabolic Panel:  Recent Labs  45/40/98 1329  NA 137  K 4.3  CL 97  CO2 26  GLUCOSE 115*  BUN 43*  CREATININE 6.20*  CALCIUM 9.1   CBC:  Recent Labs  06/21/12 1329  WBC 17.9*  NEUTROABS 15.6*  HGB 9.4*  HCT 28.5*  MCV 97.9  PLT 382   Urinalysis: No results found for this basename: COLORURINE, APPERANCEUR, LABSPEC, PHURINE, GLUCOSEU, HGBUR, BILIRUBINUR, KETONESUR, PROTEINUR, UROBILINOGEN, NITRITE, LEUKOCYTESUR,  in the last 72 hours   Imaging results:  Dg Foot Complete Left  06/21/2012  *RADIOLOGY REPORT*  Clinical Data: Left foot pain, recent surgical operation,  evaluate for infection  LEFT FOOT - COMPLETE 3+ VIEW  Comparison: 06/17/2012  Findings:  Stable sequela of amputation of the distal aspect of the first metatarsal.  Interval removal of overlying surgical drain. Irregular lucency about the operative site is favored to represent asymmetric residual soft tissue.  Query minimal amount of osteolysis involving the distal end of the residual first metatarsal, best appreciated on the oblique lateral radiograph.  No definite fracture or dislocation.  Multiple hammer toe deformities are redemonstrated.  Vascular calcifications.  Rather diffuse soft tissue swelling about the lower leg and foot.  IMPRESSION: Query minimal amount of osteolysis/osteomyelitis involving the residual distal tip of the first metatarsal.  Further evaluation with MRI may be performed as clinically indicated.   Original Report Authenticated By: Tacey Ruiz, MD      Assessment & Plan by Problem: Principal Problem:   Toe gangrene Active Problems:   Hypertension   ESRD on dialysis   Constipation - functional   Atrial fibrillation  Mr. Jeansonne is a 79 year old gentleman with a history of ESRD on HD (MWF), atrial fibrillation, and peripheral vascular disease with recent amputation of gangrenous left great toe one week ago. He presents directly from his surgeon's office where he was seen for a followup visit and directed to come to the ED for evaluation of pain, swelling and purulent discharge from his left foot wound.  Nonhealing left foot wound  Patient with long-standing peripheral vascular disease one week postop from left great toe amputation, presents today from clinic with increased pain, swelling, purulent discharge. WBCs elevated at 17.9 on admission. Consistent with persistent infection of L foot wound. L foot Xray with ?bone involvement. From 06/07/2012, Ankle arm index was 0.58 on the right  and 0.56 on the left with monophasic waveforms. Arteriography from December 2010 showed  severe unreconstructable tibial vessel disease. Patient afebrile, soft blood pressure, no tachycardia.   -Admit to internal medicine teaching service -IV Levaquin -Will likely need to undergo left BKA but will defer to vascular surgery to arrange -Fentanyl PRN for pain -Repeat CBC tomorrow  ESRD on HD (MWF) Creatinine 6.2, BUN 43 on admission. Hemoglobin 9.4. Last dialysis was the day before admission.  -Will ask Nephrology to see the patient for possible hemodialysis tomorrow -Continue PhosLo -Check renal function panel tomorrow morning  Atrial fibrillation without RVR In Afib without RVR on admission. Patient is only on aspirin at home. Chads 2 score of 2. Risk for RVR increased given current infection. Cont to monitor.  -Check EKG -Continue aspirin, metoprolol  Constipation Patient has not had a bowel movement in several days. He feels like he is stopped up. -MiraLax and senna  Hypertension Blood pressure was 83/37 initially on presentation. 106/61 when we see him on admission. -Hold home imdur -Continue metoprolol  DVT -Lovenox  FEN -Normal saline lock -Renal diet  CODE STATUS -Patient has healthcare power of attorney and living will -will ask family to bring a copy of his Living Will  Dispo Anticipate dc in 2-4 days. Patient does have a PCP and will not be needing OPC f/u   Signed: Denton Ar 06/21/2012, 4:11 PM

## 2012-06-21 NOTE — Progress Notes (Signed)
ANTIBIOTIC CONSULT NOTE - INITIAL  Pharmacy Consult:  Levaquin Indication:  Post-surgical foot infection, possible osteomyelitis  Allergies  Allergen Reactions  . Penicillins   . Morphine And Related     Agitated and sees things  . Amoxicillin   . Darvocet (Propoxyphene-Acetaminophen)     Agitated and sees things  . Keflex (Cephalexin) Other (See Comments)    Unknown   . Neurontin (Gabapentin)     Agitated and confused  . Ultram (Tramadol Hcl)     Agitated and sees things. Can take small amounts  . Vancomycin Itching    Patient Measurements: Height: 5' 10.08" (178 cm) Weight: 158 lb 1.1 oz (71.7 kg) IBW/kg (Calculated) : 73.18  Vital Signs: Temp: 98.9 F (37.2 C) (04/01 1253) Temp src: Oral (04/01 1253) BP: 100/65 mmHg (04/01 1307) Pulse Rate: 92 (04/01 1313)  Labs:  Recent Labs  06/21/12 1329  WBC 17.9*  HGB 9.4*  PLT 382   Estimated Creatinine Clearance: 5.9 ml/min (by C-G formula based on Cr of 8.13). No results found for this basename: VANCOTROUGH, VANCOPEAK, VANCORANDOM, GENTTROUGH, GENTPEAK, GENTRANDOM, TOBRATROUGH, TOBRAPEAK, TOBRARND, AMIKACINPEAK, AMIKACINTROU, AMIKACIN,  in the last Gerald hours   Microbiology: No results found for this or any previous visit (from the past 720 hour(s)).  Medical History: Past Medical History  Diagnosis Date  . Hypertension   . Ulcer   . GERD (gastroesophageal reflux disease)   . CAD (coronary artery disease)     distal LAD occlusion  . Poor circulation   . ESRD on dialysis   . Hyperlipidemia   . Cancer     Skin  . Knee pain, right   . OSA (obstructive sleep apnea)   . PAD (peripheral artery disease)   . IDA (iron deficiency anemia)   . Hyperparathyroidism   . Arthritis   . Atrial fibrillation   . Aortic stenosis        Assessment: Gerald Tucker s/p left great toe amputation presented with redness and purulent drainage to surgical site.  Pharmacy consulted to manage Levaquin for foot infection, possible  osteomyelitis.  Noted patient with ESRD on HD.   Goal of Therapy:  Clearance of infection   Plan:   - Levaquin 750mg  IV x 1, then 500mg  IV Q48H - F/U HD schedule and adjust abx schedule as appropriate - Monitor C/S, clinical course      Elliot Simoneaux D. Laney Potash, PharmD, BCPS Pager:  [phone] 06/21/2012, 2:54 PM

## 2012-06-21 NOTE — ED Provider Notes (Signed)
History     CSN: [id number]  Arrival date & time 06/21/12  1242   First MD Initiated Contact with Patient 06/21/12 1312      Chief Complaint  Patient presents with  . Foot Pain    post surgical     (Consider location/radiation/quality/duration/timing/severity/associated sxs/prior treatment) HPI Comments: Patient apparently had left great toe removal approximately one week ago at the [facility]. He has a long-standing history of peripheral vascular disease. He was evaluated by Dr. early on March 18 and based on his notes in the medical records, he had recommended that the patient undergo a below the left knee amputation. Family reports that after long discussion they wanted to try removing his gangrenous great toe first to see if that might improve his overall symptoms. Apparently Dr. early had spoken to the [facility] to help arrange this process. The patient has been following up with the wound care center prior to this. Patient has had his appropriate followup set foot center, was actually seen earlier today. Due to a foul odor, there was some concern for ongoing and worsening infection despite him taking clindamycin at home. Patient denies fever chills, shortness of breath or vomiting.  The history is provided by the patient, a relative and medical records.    Past Medical History  Diagnosis Date  . Hypertension   . Ulcer   . GERD (gastroesophageal reflux disease)   . CAD (coronary artery disease)     distal LAD occlusion  . Poor circulation   . ESRD on dialysis   . Hyperlipidemia   . Cancer     Skin  . Knee pain, right   . OSA (obstructive sleep apnea)   . PAD (peripheral artery disease)   . IDA (iron deficiency anemia)   . Hyperparathyroidism   . Arthritis   . Atrial fibrillation   . Aortic stenosis     Past Surgical History  Procedure Laterality Date  . Back surgery    . Neck surgery    . Hernia repair      right  . Knee surgery    . Leg surgery     . Shoulder surgery    . Cardiac catheterization  02/06/2005    EF 65-70%.  . Av fistula placement      LEFT ARM  . Cataract extraction, bilateral    . Cholecystectomy    . Transthoracic echocardiogram  04/04/2009    EF 60-65%  . Transthoracic echocardiogram  02/07/2008    EF 55-60%  . Cardiovascular stress test  06/01/2006    EF 55%. NORMAL LV FUNCTION    Family History  Problem Relation Age of Onset  . Stroke Mother   . Heart attack Father   . Stroke Brother   . Stroke Brother   . Stroke Brother     History  Substance Use Topics  . Smoking status: Never Smoker   . Smokeless tobacco: Never Used  . Alcohol Use: No      Review of Systems  Allergies  Penicillins; Morphine and related; Amoxicillin; Darvocet; Keflex; Neurontin; Ultram; and Vancomycin  Home Medications   Current Outpatient Rx  Name  Route  Sig  Dispense  Refill  . acetaminophen (TYLENOL) 325 MG tablet   Oral   Take 650 mg by mouth every 6 (six) hours as needed. For pain         . aspirin 81 MG tablet   Oral   Take 81 mg by mouth  daily.           . B Complex-C-Folic Acid (DIALYVITE PO)   Oral   Take by mouth.         . calcium acetate (PHOSLO) 667 MG capsule   Oral   Take 667 mg by mouth 3 (three) times daily with meals.          . clindamycin (CLEOCIN) 150 MG capsule   Oral   Take 150 mg by mouth 2 (two) times daily.          . isosorbide mononitrate (IMDUR) 60 MG 24 hr tablet   Oral   Take 60 mg by mouth daily.           Marland Kitchen LIDODERM 5 %   Transdermal   Place 1 patch onto the skin daily. One patch once or twice a week         . metoprolol (LOPRESSOR) 50 MG tablet   Oral   Take 50 mg by mouth 2 (two) times daily.          . nitroGLYCERIN (NITROSTAT) 0.4 MG SL tablet   Sublingual   Place 1 tablet (0.4 mg total) under the tongue every 5 (five) minutes as needed.   25 tablet   11   . nystatin ointment (MYCOSTATIN)   Topical   Apply 1 application topically 2 (two)  times daily.         Marland Kitchen omeprazole (PRILOSEC) 20 MG capsule   Oral   Take 20 mg by mouth 2 (two) times daily.           . polyethylene glycol (MIRALAX / GLYCOLAX) packet   Oral   Take 17 g by mouth daily.   10 each   0   . simvastatin (ZOCOR) 20 MG tablet   Oral   Take 20 mg by mouth at bedtime.           . traMADol (ULTRAM) 50 MG tablet   Oral   Take 50 mg by mouth 2 (two) times daily. For pain           BP 100/65  Pulse 92  Temp(Src) 98.9 F (37.2 C) (Oral)  Resp 18  Ht 5' 10.08" (1.78 m)  Wt 158 lb 1.1 oz (71.7 kg)  BMI 22.63 kg/m2  SpO2 100%  Physical Exam  Nursing note and vitals reviewed. Constitutional: He appears well-developed and well-nourished. No distress.  HENT:  Head: Normocephalic and atraumatic.  Eyes: EOM are normal. No scleral icterus.  Neck: Neck supple.  Cardiovascular: Normal rate.   Pulmonary/Chest: Effort normal. No respiratory distress.  Abdominal: Soft. He exhibits no distension. There is no tenderness.  Musculoskeletal: He exhibits edema and tenderness.  Swelling to lower half of lower left leg, post surgical swelling, erythema, foul odor.  No palpable DP or PT pulses, cap refill is 4-5 seconds  Neurological: He is alert.  Skin: Skin is warm.    ED Course  Procedures (including critical care time)  Labs Reviewed  CBC WITH DIFFERENTIAL - Abnormal; Notable for the following:    WBC 17.9 (*)    RBC 2.91 (*)    Hemoglobin 9.4 (*)    HCT 28.5 (*)    Neutrophils Relative 87 (*)    Neutro Abs 15.6 (*)    Lymphocytes Relative 5 (*)    Monocytes Absolute 1.4 (*)    All other components within normal limits  BASIC METABOLIC PANEL - Abnormal; Notable for the following:  Glucose, Bld 115 (*)    BUN 43 (*)    Creatinine, Ser 6.20 (*)    GFR calc non Af Amer 7 (*)    GFR calc Af Amer 8 (*)    All other components within normal limits   Dg Foot Complete Left  06/21/2012  *RADIOLOGY REPORT*  Clinical Data: Left foot pain, recent  surgical operation, evaluate for infection  LEFT FOOT - COMPLETE 3+ VIEW  Comparison: 06/17/2012  Findings:  Stable sequela of amputation of the distal aspect of the first metatarsal.  Interval removal of overlying surgical drain. Irregular lucency about the operative site is favored to represent asymmetric residual soft tissue.  Query minimal amount of osteolysis involving the distal end of the residual first metatarsal, best appreciated on the oblique lateral radiograph.  No definite fracture or dislocation.  Multiple hammer toe deformities are redemonstrated.  Vascular calcifications.  Rather diffuse soft tissue swelling about the lower leg and foot.  IMPRESSION: Query minimal amount of osteolysis/osteomyelitis involving the residual distal tip of the first metatarsal.  Further evaluation with MRI may be performed as clinically indicated.   Original Report Authenticated By: Tacey Ruiz, MD      1. Post-operative infection, initial encounter   2. Osteomyelitis of foot, acute, left   3. Peripheral vascular disease     ra sat is 100% and I interpret to be normal  MDM  Pt with likely post surgical infectino related to poor vascular supply.  Discussed with Dr. Myra Gianotti who will see pt in the ED or inpt.  Pt's PCP is currently just Dr. Kathrene Bongo.  I spoke to [facility] to admit pt as unassigned medicine for IV abx, monitoring with vascular consultation pending.  I have ordered IV levaquin for now and defer other abx to inpt team.  Pt's initial BP was low, improved without intervention and is now normal.            Gavin Pound. Oletta Lamas, MD 06/21/12 1610

## 2012-06-22 ENCOUNTER — Encounter (HOSPITAL_COMMUNITY): Payer: Self-pay | Admitting: *Deleted

## 2012-06-22 DIAGNOSIS — M869 Osteomyelitis, unspecified: Secondary | ICD-10-CM

## 2012-06-22 DIAGNOSIS — I96 Gangrene, not elsewhere classified: Secondary | ICD-10-CM

## 2012-06-22 DIAGNOSIS — M86179 Other acute osteomyelitis, unspecified ankle and foot: Secondary | ICD-10-CM

## 2012-06-22 DIAGNOSIS — I739 Peripheral vascular disease, unspecified: Secondary | ICD-10-CM

## 2012-06-22 LAB — RENAL FUNCTION PANEL
Albumin: 2.1 g/dL — ABNORMAL LOW (ref 3.5–5.2)
BUN: 50 mg/dL — ABNORMAL HIGH (ref 6–23)
CO2: 24 meq/L (ref 19–32)
Calcium: 8.9 mg/dL (ref 8.4–10.5)
Chloride: 99 meq/L (ref 96–112)
Creatinine, Ser: 7.1 mg/dL — ABNORMAL HIGH (ref 0.50–1.35)
GFR calc Af Amer: 7 mL/min — ABNORMAL LOW
GFR calc non Af Amer: 6 mL/min — ABNORMAL LOW
Glucose, Bld: 104 mg/dL — ABNORMAL HIGH (ref 70–99)
Phosphorus: 4.3 mg/dL (ref 2.3–4.6)
Potassium: 4.1 meq/L (ref 3.5–5.1)
Sodium: 137 meq/L (ref 135–145)

## 2012-06-22 LAB — CBC
HCT: 27.2 % — ABNORMAL LOW (ref 39.0–52.0)
Hemoglobin: 9.1 g/dL — ABNORMAL LOW (ref 13.0–17.0)
MCH: 32.6 pg (ref 26.0–34.0)
MCHC: 33.5 g/dL (ref 30.0–36.0)
MCV: 97.5 fL (ref 78.0–100.0)
Platelets: 356 K/uL (ref 150–400)
RBC: 2.79 MIL/uL — ABNORMAL LOW (ref 4.22–5.81)
RDW: 14.9 % (ref 11.5–15.5)
WBC: 17.1 10*3/uL — ABNORMAL HIGH (ref 4.0–10.5)

## 2012-06-22 MED ORDER — NEPRO/CARBSTEADY PO LIQD
237.0000 mL | Freq: Two times a day (BID) | ORAL | Status: DC
  Administered 2012-06-23 – 2012-06-25 (×3): 237 mL via ORAL
  Administered 2012-06-26: 14:00:00 via ORAL
  Administered 2012-06-26 – 2012-06-29 (×6): 237 mL via ORAL

## 2012-06-22 MED ORDER — SODIUM CHLORIDE 0.9 % IV SOLN
125.0000 mg | INTRAVENOUS | Status: DC
  Administered 2012-06-24 – 2012-06-29 (×3): 125 mg via INTRAVENOUS
  Filled 2012-06-22 (×8): qty 10

## 2012-06-22 MED ORDER — DARBEPOETIN ALFA-POLYSORBATE 40 MCG/0.4ML IJ SOLN
INTRAMUSCULAR | Status: AC
  Filled 2012-06-22: qty 0.4

## 2012-06-22 MED ORDER — ALTEPLASE 2 MG IJ SOLR
4.0000 mg | Freq: Once | INTRAMUSCULAR | Status: DC
  Filled 2012-06-22: qty 4

## 2012-06-22 MED ORDER — DARBEPOETIN ALFA-POLYSORBATE 40 MCG/0.4ML IJ SOLN
40.0000 ug | INTRAMUSCULAR | Status: DC
  Administered 2012-06-22: 40 ug via INTRAVENOUS
  Filled 2012-06-22: qty 0.4

## 2012-06-22 NOTE — Progress Notes (Signed)
Subjective: [name] is complaining of back pain this morning and asks if he can get a bigger bed and put a large mattress on the floor. He was unable to sleep because of back pain.  His foot pain is "50 percent better" and he continues to deny feeling fever or chills.  Has not had a bowel movement yet today.   Objective: Vital signs in last 24 hours: Filed Vitals:   06/21/12 1856 06/21/12 2151 06/21/12 2226 06/22/12 0633  BP: 116/86 90/48  105/46  Pulse: 98 111  104  Temp: 98.5 F (36.9 C) 98.1 F (36.7 C)  97.9 F (36.6 C)  TempSrc: Oral Oral  Oral  Resp: 18 17  17   Height:      Weight:   151 lb 1.6 oz (68.539 kg)   SpO2: 96% 100%  98%   Weight change:  No intake or output data in the 24 hours ending 06/22/12 0817  Physical Exam Blood pressure 126/65, pulse 93, temperature 98.2 F (36.8 C), temperature source Oral, resp. rate 18, height 5' 10.08" (1.78 m), weight 151 lb 1.6 oz (68.539 kg), SpO2 96.00%. General: No acute distress, alert and oriented x 3, appears stated age  HEENT: PERRL, EOMI, moist mucous membranes  Cardiovascular: Irregular rhythm, rate in the 90s, soft blowing systolic murmur  Respiratory: Clear to auscultation bilaterally, no wheezes, rales, or rhonchi  Abdomen: Soft, nondistended, nontender, bowel sounds present  Extremities: Left upper extremity with aVF. Left lower extremity warm, erythematous to dorsum, wound bed with purulent drainage and surrounding necrosis. Foul odor noted. RLE with stable dry gangrene over R 4th toe and blister over R great toe.  Neuro: Not anxious appearing, no depressed mood, normal affect.    Lab Results: Basic Metabolic Panel:  Recent Labs Lab 06/21/12 1329 06/22/12 0550  NA 137 137  K 4.3 4.1  CL 97 99  CO2 26 24  GLUCOSE 115* 104*  BUN 43* 50*  CREATININE 6.20* 7.10*  CALCIUM 9.1 8.9  PHOS  --  4.3   Liver Function Tests:  Recent Labs Lab 06/22/12 0550  ALBUMIN 2.1*   No results found for this  basename: LIPASE, AMYLASE,  in the last 168 hours No results found for this basename: AMMONIA,  in the last 168 hours CBC:  Recent Labs Lab 06/21/12 1329 06/22/12 0550  WBC 17.9* 17.1*  NEUTROABS 15.6*  --   HGB 9.4* 9.1*  HCT 28.5* 27.2*  MCV 97.9 97.5  PLT 382 356   Studies/Results: Dg Knee Complete 4 Views Left  06/21/2012  *RADIOLOGY REPORT*  Clinical Data: Fall, medial knee pain  LEFT KNEE - COMPLETE 4+ VIEW  Comparison: 10/03/2009  Findings: Moderate to severe tricompartmental degenerative changes, most prominent in the patellofemoral compartment.  No evidence of acute fracture or dislocation.  Stable fragmentation of the osteophyte along the medial tibial plateau.  Possible small suprapatellar knee joint effusion.  Extensive vascular calcifications.  IMPRESSION: No evidence acute fracture or dislocation.  Moderate to severe tricompartmental degenerative changes.   Original Report Authenticated By: Charline Bills, M.D.    Dg Foot Complete Left  06/21/2012  *RADIOLOGY REPORT*  Clinical Data: Left foot pain, recent surgical operation, evaluate for infection  LEFT FOOT - COMPLETE 3+ VIEW  Comparison: 06/17/2012  Findings:  Stable sequela of amputation of the distal aspect of the first metatarsal.  Interval removal of overlying surgical drain. Irregular lucency about the operative site is favored to represent asymmetric residual soft tissue.  Query minimal  amount of osteolysis involving the distal end of the residual first metatarsal, best appreciated on the oblique lateral radiograph.  No definite fracture or dislocation.  Multiple hammer toe deformities are redemonstrated.  Vascular calcifications.  Rather diffuse soft tissue swelling about the lower leg and foot.  IMPRESSION: Query minimal amount of osteolysis/osteomyelitis involving the residual distal tip of the first metatarsal.  Further evaluation with MRI may be performed as clinically indicated.   Original Report Authenticated By: Tacey Ruiz, MD    Medications:  Medications reviewed  Scheduled Meds: . aspirin  81 mg Oral Daily  . calcium acetate  667 mg Oral TID WC  . enoxaparin (LOVENOX) injection  30 mg Subcutaneous QHS  . [START ON 06/23/2012] levofloxacin (LEVAQUIN) IV  500 mg Intravenous Q48H  . metoprolol  50 mg Oral BID  . multivitamin  1 tablet Oral QHS  . pantoprazole  40 mg Oral Q1200  . polyethylene glycol  17 g Oral Daily  . senna  1 tablet Oral BID  . simvastatin  20 mg Oral QHS   Continuous Infusions:  PRN Meds:.acetaminophen, fentaNYL  Assessment/Plan:  Mr. Pooley is a 79 year old gentleman with a history of ESRD on HD (MWF), atrial fibrillation, and peripheral vascular disease with recent amputation of gangrenous left great toe one week ago. He presents directly from his surgeon's office where he was seen for a followup visit and directed to come to the ED for evaluation of pain, swelling and purulent discharge from his left foot wound.   Nonhealing left foot wound  In setting of severe PVD, s/p L great toe amputation 1 week ago.  Ankle arm index on 06/07/12 was 0.58 on the right and 0.56 on the left with monophasic waveforms. Arteriography from December 2010 showed severe unreconstructable tibial vessel disease.   -will ask ID for IV antibiotic recommendations given numerous allergies -Will likely need to undergo left BKA but will defer to vascular surgery to arrange  -Fentanyl PRN for pain   ESRD on HD (MWF)  Creatinine 6.2, BUN 43 on admission. Hemoglobin 9.4. Last dialysis was the day before admission.  Discussed with Dr. Briant Cedar who will see patient today. -Continue PhosLo  -follow renal function panel  Atrial fibrillation without RVR  In Afib without RVR on admission. Patient is only on aspirin at home. Chads 2 score of 2.  Risk for RVR increased given current infection. Cont to monitor.  Rate 80-110 overnight, no palpitations. -Check EKG  -Continue aspirin, metoprolol   Chronic  back pain Patient complains of worsening back pain due to the uncomfortable and small hospital bed. He has struggled for >20 years with chronic back pain. We discussed using his PRN pain medications and reminded him that he needs to ask his nurses for pain medications. Offered extra pillows. -continue PRN fentanyl  Constipation  No BM yet today. -cont MiraLax and senna  -encourage oral hydration  Hypertension  Blood pressure was 83/37 initially on presentation. This morning BP 126/64 -Hold home imdur in setting of low blood pressure -Continue metoprolol   DVT  -Lovenox   FEN  -Normal saline lock  -Renal diet   CODE STATUS  -Patient's son healthcare power of attorney -will ask family to bring a copy of his Living Will   Dispo  Anticipate dc in 2-4 days.  Patient does have a PCP and will not be needing OPC f/u     LOS: 1 day   [place] Ar 06/22/2012, 8:17 AM

## 2012-06-22 NOTE — Consult Note (Signed)
[facility]    Date of Admission:  06/21/2012  Date of Consult:  06/22/2012  Reason for Consult: Necrotic toe with osteomyelitis Referring Physician:  Dr. Eben Burow   HPI: Gerald Tucker is an 79 y.o. male.  with past medical history of end-stage renal disease on hemodialysis, atrial fibrillation on aspirin, peripheral vascular disease who recently had amputation of the gangrenous left great toe on Tuesday, despite recommendations by vascular surgery that he undergo a more proximal amputation. Patient was managed postoperatively with with clindamycin but unfortunate had increasing pain and increasing feculent smelling drainage from his black ulcerative lesion in his toe bed. He has been admitted to medicine service and placed on levaquin. We are asked to assist with the workup and management of this nonviable necrotic toe bed. I spent greater than 60 minutes with the patient and his son including greater than 50% of time in face to face counsel of the patient and in coordination of their care. I emphasized that the patient MUST have a BKA vs AKA to effect cure and that abx alone were pointless here. He does have evidence of PVD in the opposite toe with black eschars there as well.      Past Medical History  Diagnosis Date  . Hypertension   . Ulcer   . GERD (gastroesophageal reflux disease)   . CAD (coronary artery disease)     distal LAD occlusion  . Poor circulation   . ESRD on dialysis   . Hyperlipidemia   . Cancer     Skin  . Knee pain, right   . OSA (obstructive sleep apnea)   . PAD (peripheral artery disease)   . IDA (iron deficiency anemia)   . Hyperparathyroidism   . Arthritis   . Atrial fibrillation   . Aortic stenosis     Past Surgical History  Procedure Laterality Date  . Back surgery    . Neck surgery    . Hernia repair      right  . Knee surgery    . Leg surgery    . Shoulder surgery    . Cardiac catheterization  02/06/2005    EF  65-70%.  . Av fistula placement      LEFT ARM  . Cataract extraction, bilateral    . Cholecystectomy    . Transthoracic echocardiogram  04/04/2009    EF 60-65%  . Transthoracic echocardiogram  02/07/2008    EF 55-60%  . Cardiovascular stress test  06/01/2006    EF 55%. NORMAL LV FUNCTION  ergies:   Allergies  Allergen Reactions  . Penicillins Rash    "felt like on fire"  . Morphine And Related     Agitated and sees things  . Amoxicillin     Pt reported unknown reaction  . Darvocet (Propoxyphene-Acetaminophen)     Agitated and sees things  . Keflex (Cephalexin) Other (See Comments)    Pt reported unknown reaction  . Neurontin (Gabapentin)     Agitated and confused  . Ultram (Tramadol Hcl)     Agitated and sees things. Can take small amounts  . Vancomycin Itching     Medications: I have reviewed patients current medications as documented in Epic Anti-infectives   Start     Dose/Rate Route Frequency Ordered Stop   06/23/12 1600  levofloxacin (LEVAQUIN) IVPB 500 mg     500 mg 100 mL/hr over 60 Minutes Intravenous Every 48 hours 06/21/12 1456     06/21/12  1500  levofloxacin (LEVAQUIN) IVPB 750 mg     750 mg 100 mL/hr over 90 Minutes Intravenous  Once 06/21/12 1456 06/21/12 1725      Social History:  reports that he has never smoked. He has never used smokeless tobacco. He reports that he does not drink alcohol or use illicit drugs.  Family History  Problem Relation Age of Onset  . Stroke Mother   . Heart attack Father   . Stroke Brother   . Stroke Brother   . Stroke Brother     As in HPI and primary teams notes otherwise 12 point review of systems is negative  Blood pressure 156/74, pulse 99, temperature 97.7 F (36.5 C), temperature source Oral, resp. rate 18, height 5' 10.08" (1.78 m), weight 151 lb 1.6 oz (68.539 kg), SpO2 96.00%. General: Alert and awake, oriented x not in any acute distress. HEENT: anicteric sclera, pupils reactive to light and  accommodation, EOMI, oropharynx clear and without exudate CVS regular rate, normal r,  no murmur rubs or gallops Chest: clear to auscultation bilaterally, no wheezing, rales or rhonchi Abdomen: soft nontender, nondistended, normal bow  Extremities: Left foot with necrotic feculent smelling bed of the site of amputation.  Right foot with black eschars and also evidence of peripheral vascular disease although no anger he erythema or tenderness to palpation present. He states that periodically oh was draining from this area approximately 3 months ago.  Neuro: nonfocal,   Results for orders placed during the hospital encounter of 06/21/12 (from the past 48 hour(s))  CBC WITH DIFFERENTIAL     Status: Abnormal   Collection Time    06/21/12  1:29 PM      Result Value Range   WBC 17.9 (*) 4.0 - 10.5 K/uL   RBC 2.91 (*) 4.22 - 5.81 MIL/uL   Hemoglobin 9.4 (*) 13.0 - 17.0 g/dL   HCT 10.2 (*) 72.5 - 36.6 %   MCV 97.9  78.0 - 100.0 fL   MCH 32.3  26.0 - 34.0 pg   MCHC 33.0  30.0 - 36.0 g/dL   RDW 44.0  34.7 - 42.5 %   Platelets 382  150 - 400 K/uL   Neutrophils Relative 87 (*) 43 - 77 %   Neutro Abs 15.6 (*) 1.7 - 7.7 K/uL   Lymphocytes Relative 5 (*) 12 - 46 %   Lymphs Abs 0.9  0.7 - 4.0 K/uL   Monocytes Relative 8  3 - 12 %   Monocytes Absolute 1.4 (*) 0.1 - 1.0 K/uL   Eosinophils Relative 0  0 - 5 %   Eosinophils Absolute 0.1  0.0 - 0.7 K/uL   Basophils Relative 0  0 - 1 %   Basophils Absolute 0.0  0.0 - 0.1 K/uL  BASIC METABOLIC PANEL     Status: Abnormal   Collection Time    06/21/12  1:29 PM      Result Value Range   Sodium 137  135 - 145 mEq/L   Potassium 4.3  3.5 - 5.1 mEq/L   Chloride 97  96 - 112 mEq/L   CO2 26  19 - 32 mEq/L   Glucose, Bld 115 (*) 70 - 99 mg/dL   BUN 43 (*) 6 - 23 mg/dL   Creatinine, Ser 9.56 (*) 0.50 - 1.35 mg/dL   Calcium 9.1  8.4 - 38.7 mg/dL   GFR calc non Af Amer 7 (*) >90 mL/min   GFR calc Af Amer 8 (*) >90 mL/min  Comment:            The eGFR  has been calculated     using the CKD EPI equation.     This calculation has not been     validated in all clinical     situations.     eGFR's persistently     <90 mL/min signify     possible Chronic Kidney Disease.  CBC     Status: Abnormal   Collection Time    06/22/12  5:50 AM      Result Value Range   WBC 17.1 (*) 4.0 - 10.5 K/uL   RBC 2.79 (*) 4.22 - 5.81 MIL/uL   Hemoglobin 9.1 (*) 13.0 - 17.0 g/dL   HCT 16.1 (*) 09.6 - 04.5 %   MCV 97.5  78.0 - 100.0 fL   MCH 32.6  26.0 - 34.0 pg   MCHC 33.5  30.0 - 36.0 g/dL   RDW 40.9  81.1 - 91.4 %   Platelets 356  150 - 400 K/uL  RENAL FUNCTION PANEL     Status: Abnormal   Collection Time    06/22/12  5:50 AM      Result Value Range   Sodium 137  135 - 145 mEq/L   Potassium 4.1  3.5 - 5.1 mEq/L   Chloride 99  96 - 112 mEq/L   CO2 24  19 - 32 mEq/L   Glucose, Bld 104 (*) 70 - 99 mg/dL   BUN 50 (*) 6 - 23 mg/dL   Creatinine, Ser 7.82 (*) 0.50 - 1.35 mg/dL   Calcium 8.9  8.4 - 95.6 mg/dL   Phosphorus 4.3  2.3 - 4.6 mg/dL   Albumin 2.1 (*) 3.5 - 5.2 g/dL   GFR calc non Af Amer 6 (*) >90 mL/min   GFR calc Af Amer 7 (*) >90 mL/min   Comment:            The eGFR has been calculated     using the CKD EPI equation.     This calculation has not been     validated in all clinical     situations.     eGFR's persistently     <90 mL/min signify     possible Chronic Kidney Disease.  MRSA PCR SCREENING     Status: None   Collection Time    06/22/12  8:06 AM      Result Value Range   MRSA by PCR NEGATIVE  NEGATIVE   Comment:            The GeneXpert MRSA Assay (FDA     approved for NASAL specimens     only), is one component of a     comprehensive MRSA colonization     surveillance program. It is not     intended to diagnose MRSA     infection nor to guide or     monitor treatment for     MRSA infections.      Component Value Date/Time   SDES URINE, CLEAN CATCH 01/01/2012 1623   SPECREQUEST NONE 01/01/2012 1623   CULT  Multiple bacterial morphotypes present, none predominant. Suggest appropriate recollection if clinically indicated. 01/01/2012 1623   REPTSTATUS 01/03/2012 FINAL 01/01/2012 1623   Dg Knee Complete 4 Views Left  06/21/2012  *RADIOLOGY REPORT*  Clinical Data: Fall, medial knee pain  LEFT KNEE - COMPLETE 4+ VIEW  Comparison: 10/03/2009  Findings: Moderate to severe tricompartmental degenerative changes, most prominent in the  patellofemoral compartment.  No evidence of acute fracture or dislocation.  Stable fragmentation of the osteophyte along the medial tibial plateau.  Possible small suprapatellar knee joint effusion.  Extensive vascular calcifications.  IMPRESSION: No evidence acute fracture or dislocation.  Moderate to severe tricompartmental degenerative changes.   Original Report Authenticated By: Charline Bills, M.D.    Dg Foot Complete Left  06/21/2012  *RADIOLOGY REPORT*  Clinical Data: Left foot pain, recent surgical operation, evaluate for infection  LEFT FOOT - COMPLETE 3+ VIEW  Comparison: 06/17/2012  Findings:  Stable sequela of amputation of the distal aspect of the first metatarsal.  Interval removal of overlying surgical drain. Irregular lucency about the operative site is favored to represent asymmetric residual soft tissue.  Query minimal amount of osteolysis involving the distal end of the residual first metatarsal, best appreciated on the oblique lateral radiograph.  No definite fracture or dislocation.  Multiple hammer toe deformities are redemonstrated.  Vascular calcifications.  Rather diffuse soft tissue swelling about the lower leg and foot.  IMPRESSION: Query minimal amount of osteolysis/osteomyelitis involving the residual distal tip of the first metatarsal.  Further evaluation with MRI may be performed as clinically indicated.   Original Report Authenticated By: Tacey Ruiz, MD      Recent Results (from the past 720 hour(s))  MRSA PCR SCREENING     Status: None   Collection  Time    06/22/12  8:06 AM      Result Value Range Status   MRSA by PCR NEGATIVE  NEGATIVE Final   Comment:            The GeneXpert MRSA Assay (FDA     approved for NASAL specimens     only), is one component of a     comprehensive MRSA colonization     surveillance program. It is not     intended to diagnose MRSA     infection nor to guide or     monitor treatment for     MRSA infections.     Impression/Recommendation  79 year old gentleman with gangrenous toe that is nonviable now with persistent gangrene in the wound bed after amputation of the toe who is in need of more proximal amputation. He also has evidence of poor blood flow to his opposite foot where he apparently has had purulent drainage in the past.  #1 Necrotic nonviable toe ulcer bed: --RECOMMEND BKA VS AKA --I would cover him postoperatively with IV vancomycin and Ceftazidime (note review of echart indicates he tolerates both abx fine) --would continue these for 48 hours post surgery  #2 ? OSteo in the opposite toe: would at least get plain films of the opposite toe and consider MRI to evaluate if there is any sig brewiing osteomyelitis there, check esr, crp   Thank you so much for this interesting consult  [facility] [phone] (pager) [phone] (office) 06/22/2012, 6:21 PM  Gerald Tucker 06/22/2012, 6:21 PM

## 2012-06-22 NOTE — Consult Note (Signed)
WOC consult Note Reason for Consult: Consult requested for left foot wound.  [facility]VS has assessed on 4/1 and indicates that topical treatment will be minimally effective to promote healing to necrotic wound with possible osteomyelitis, but son has not decided upon the recommended further amputation surgery at this time.  Assessed left foot wound with primary team at bedside. Wound type: Full thickness, post-op recent toe amputation Measurement:6X2X3cm to left anterior foot. Right 4th toe with 1X.5cm area of dry intact eschar without odor or drainage.  It is best practice to leave dry stable eschar intact.  No topical treatment needed at this time to right foot. Wound bed:100% black inner wound bed Drainage (amount, consistency, odor) Strong foul odor, mod brown drainage Periwound: Erythremia surrounding wound to anterior foot.  Difficult to find pedal pulse.  Entire left foot painful to touch. Dressing procedure/placement/frequency: Moist gauze dressing until further input provided by VVS team.  Primary team discussed plan of care with son at bedside and he has decided to proceed with recommended surgery for his father. Will defer to [facility] team for further plan of care. Please re-consult if further assistance is needed.  Thank-you,  Cammie Mcgee MSN, RN, CWOCN, [place], CNS [phone]

## 2012-06-22 NOTE — H&P (Signed)
Internal Medicine Attending Admission Note Date: 06/22/2012  Patient name: Gerald Tucker Medical record number: [id number] Date of birth: 1918/10/23 Age: 79 y.o. Gender: male  I saw and evaluated the patient. I reviewed the resident's note and I agree with the resident's findings and plan as documented in the resident's note.  Chief Complaint(s): Progressive left foot gangrene  History - key components related to admission: Patient is a 79 year old gentleman with past medical history of end-stage renal disease on hemodialysis, atrial fibrillation on aspirin, peripheral vascular disease who recently had amputation of the gangrenous left great toe on Tuesday that is one week prior to admission at the outpatient surgical center. Patient was seen yesterday For followup of his left toe amputation but was sent to the ER after the stump was found to be infected.  The patient is currently in extreme pain which is being managed with fentanyl. Vascular surgery plans to proceed with left BKA and patient and his family seems to be in agreement at this time.  15 point review of systems is negative except what is noted above.  Past medical history, past surgical history, medications, family history and social history was reviewed and as per resident's.  Physical Exam - key components related to admission:  Filed Vitals:   06/22/12 0633 06/22/12 0923 06/22/12 1445 06/22/12 1600  BP: 105/46 126/65 120/64 155/85  Pulse: 104 93 103 92  Temp: 97.9 F (36.6 C) 98.2 F (36.8 C) 98.1 F (36.7 C) 97.7 F (36.5 C)  TempSrc: Oral   Oral  Resp: 17 18 16 18   Height:      Weight:      SpO2: 98% 96% 96% 96%  Physical Exam: General: Vital signs reviewed and noted. Well-developed, well-nourished, in no acute distress; alert, appropriate and cooperative throughout examination.  Head: Normocephalic, atraumatic.  Eyes: PERRL, EOMI, No signs of anemia or jaundince.  Nose: Mucous membranes moist, not inflammed,  nonerythematous.  Throat: Oropharynx nonerythematous, no exudate appreciated.   Neck: No deformities, masses, or tenderness noted.Supple, No carotid Bruits, no JVD.  Lungs:  Normal respiratory effort. Clear to auscultation BL without crackles or wheezes.  Heart: RRR. S1 and S2 normal without gallop, murmur, or rubs.  Abdomen:  BS normoactive. Soft, Nondistended, non-tender.  No masses or organomegaly.  Extremities: No pretibial edema.  Neurologic: A&O X3, CN II - XII are grossly intact. Motor strength is 5/5 in the all 4 extremities, Sensations intact to light touch, Cerebellar signs negative.  Local examination   left foot had 7 cm x 3 cm x 3 cm  wound at the site of the great toe. Purulent discharge, black outlining and foul smell noted. Redness noted at the site which was extending up to mid shin, could not appreciate peripheral tibial pulses and dorsalis pedis bilaterally. Patient does have superficial skin erosions on the right foot which currently does not seem infected.      Lab results:   Basic Metabolic Panel:  Recent Labs  08/65/78 1329 06/22/12 0550  NA 137 137  K 4.3 4.1  CL 97 99  CO2 26 24  GLUCOSE 115* 104*  BUN 43* 50*  CREATININE 6.20* 7.10*  CALCIUM 9.1 8.9  PHOS  --  4.3   Liver Function Tests:  Recent Labs  06/22/12 0550  ALBUMIN 2.1*   CBC:  Recent Labs  06/21/12 1329 06/22/12 0550  WBC 17.9* 17.1*  NEUTROABS 15.6*  --   HGB 9.4* 9.1*  HCT 28.5* 27.2*  MCV 97.9  97.5  PLT 382 356   Imaging results:  Dg Knee Complete 4 Views Left  06/21/2012  *RADIOLOGY REPORT*  Clinical Data: Fall, medial knee pain  LEFT KNEE - COMPLETE 4+ VIEW  Comparison: 10/03/2009  Findings: Moderate to severe tricompartmental degenerative changes, most prominent in the patellofemoral compartment.  No evidence of acute fracture or dislocation.  Stable fragmentation of the osteophyte along the medial tibial plateau.  Possible small suprapatellar knee joint effusion.   Extensive vascular calcifications.  IMPRESSION: No evidence acute fracture or dislocation.  Moderate to severe tricompartmental degenerative changes.   Original Report Authenticated By: Charline Bills, M.D.    Dg Foot Complete Left  06/21/2012  *RADIOLOGY REPORT*  Clinical Data: Left foot pain, recent surgical operation, evaluate for infection  LEFT FOOT - COMPLETE 3+ VIEW  Comparison: 06/17/2012  Findings:  Stable sequela of amputation of the distal aspect of the first metatarsal.  Interval removal of overlying surgical drain. Irregular lucency about the operative site is favored to represent asymmetric residual soft tissue.  Query minimal amount of osteolysis involving the distal end of the residual first metatarsal, best appreciated on the oblique lateral radiograph.  No definite fracture or dislocation.  Multiple hammer toe deformities are redemonstrated.  Vascular calcifications.  Rather diffuse soft tissue swelling about the lower leg and foot.  IMPRESSION: Query minimal amount of osteolysis/osteomyelitis involving the residual distal tip of the first metatarsal.  Further evaluation with MRI may be performed as clinically indicated.   Original Report Authenticated By: Tacey Ruiz, MD     Assessment & Plan by Problem:  Principal Problem:   Toe gangrene Active Problems:   Hypertension   ESRD on dialysis   Constipation - functional   Atrial fibrillation   Patient is a 79 year old gentleman with past medical history most significant for end-stage renal disease on hemodialysis, atrial fibrillation and cerebrovascular disease with recent amputation of gangrenous left great toe one week ago. Patient was instructed from surgeon's office for further management of his left foot. The most definitive management at this time seems to be below knee amputation. Although antibiotics have minimal rule at this stage we have consulted ID to suggest appropriate antibiotic as patient is allergic to penicillin.  Patient's medical management will continue as per resident's note. Renal team was consulted to continue inpatient hemodialysis.  Lars Mage MD Faculty-Internal Medicine Residency Program

## 2012-06-22 NOTE — Progress Notes (Addendum)
Patient oriented to  Person    and ,place  .   Assisted back to bed  After  Diner  But refusing to  Have  Dressing  Removed  From  Foot.  Dressing has serisanguous draining with  Foul  Odor.

## 2012-06-22 NOTE — Consult Note (Signed)
[facility] Renal Consultation Note    Indication for Consultation:  Management of ESRD/hemodialysis; anemia, hypertension/volume and secondary hyperparathyroidism  HPI: Gerald Tucker is a 79 y.o. male ESRD patient (MWF HD) with a longstanding history of PVD and is s/p amputation of gangrenous left great toe approximately 1 week ago. During his pre-op evaluation with Dr. Arbie Cookey on 06/07/12, left BKA was recommended but family wished to first try removal of the toe only.  Despite appropriate wound care, he is with significant pain and gangrene is worsening. He now has a malodorous 6 x 2 x 3 cm draining wound to the left foot.  Prior to this admission, the patient and his family were  hesitant to pursue additional surgery.  However, after speaking with primary hospital physician (at the bedside during this encounter), they now seem to be more open to surgical intervention.  They plan to make their final decision once they speak with the vascular surgeons today.  Meanwhile, the patient states that his outpatient  dialysis treatments have been going well and both he and his family would like to continue regular dialysis. His last treatment was Monday. He denies fever, HA, dizziness, sob or chills. His only other complaint is back pain, stated as chronic.    Past Medical History  Diagnosis Date  . Hypertension   . Ulcer   . GERD (gastroesophageal reflux disease)   . CAD (coronary artery disease)     distal LAD occlusion  . Poor circulation   . ESRD on dialysis   . Hyperlipidemia   . Cancer     Skin  . Knee pain, right   . OSA (obstructive sleep apnea)   . PAD (peripheral artery disease)   . IDA (iron deficiency anemia)   . Hyperparathyroidism   . Arthritis   . Atrial fibrillation   . Aortic stenosis    Past Surgical History  Procedure Laterality Date  . Back surgery    . Neck surgery    . Hernia repair      right  . Knee surgery    . Leg surgery    . Shoulder surgery     . Cardiac catheterization  02/06/2005    EF 65-70%.  . Av fistula placement      LEFT ARM  . Cataract extraction, bilateral    . Cholecystectomy    . Transthoracic echocardiogram  04/04/2009    EF 60-65%  . Transthoracic echocardiogram  02/07/2008    EF 55-60%  . Cardiovascular stress test  06/01/2006    EF 55%. NORMAL LV FUNCTION   Family History  Problem Relation Age of Onset  . Stroke Mother   . Heart attack Father   . Stroke Brother   . Stroke Brother   . Stroke Brother    Social History:  reports that he has never smoked. He has never used smokeless tobacco. He reports that he does not drink alcohol or use illicit drugs. Allergies  Allergen Reactions  . Penicillins   . Morphine And Related     Agitated and sees things  . Amoxicillin   . Darvocet (Propoxyphene-Acetaminophen)     Agitated and sees things  . Keflex (Cephalexin) Other (See Comments)    Unknown   . Neurontin (Gabapentin)     Agitated and confused  . Ultram (Tramadol Hcl)     Agitated and sees things. Can take small amounts  . Vancomycin Itching   Prior to Admission medications   Medication Sig Start  Date End Date Taking? Authorizing Provider  acetaminophen (TYLENOL) 325 MG tablet Take 650 mg by mouth every 6 (six) hours as needed. For pain   Yes Historical Provider, MD  aspirin 81 MG tablet Take 81 mg by mouth daily.     Yes Historical Provider, MD  calcium acetate (PHOSLO) 667 MG capsule Take 667 mg by mouth 3 (three) times daily with meals.    Yes Historical Provider, MD  clindamycin (CLEOCIN) 150 MG capsule Take 150 mg by mouth 2 (two) times daily.  02/23/12  Yes Historical Provider, MD  isosorbide mononitrate (IMDUR) 60 MG 24 hr tablet Take 60 mg by mouth daily.     Yes Historical Provider, MD  LIDODERM 5 % Place 1 patch onto the skin daily. One patch once or twice a week 11/27/11  Yes Historical Provider, MD  metoprolol (LOPRESSOR) 50 MG tablet Take 50 mg by mouth 2 (two) times daily.    Yes  Historical Provider, MD  nitroGLYCERIN (NITROSTAT) 0.4 MG SL tablet Place 1 tablet (0.4 mg total) under the tongue every 5 (five) minutes as needed. 03/12/11  Yes Peter M [place], MD  nystatin ointment (MYCOSTATIN) Apply 1 application topically 2 (two) times daily.   Yes Historical Provider, MD  omeprazole (PRILOSEC) 20 MG capsule Take 20 mg by mouth 2 (two) times daily.     Yes Historical Provider, MD  polyethylene glycol (MIRALAX / GLYCOLAX) packet Take 17 g by mouth daily. 01/04/12  Yes Nishant Dhungel, MD  simvastatin (ZOCOR) 20 MG tablet Take 20 mg by mouth at bedtime.     Yes Historical Provider, MD  traMADol (ULTRAM) 50 MG tablet Take 50 mg by mouth 2 (two) times daily. For pain   Yes Historical Provider, MD   Current Facility-Administered Medications  Medication Dose Route Frequency Provider Last Rate Last Dose  . acetaminophen (TYLENOL) tablet 650 mg  650 mg Oral Q6H PRN Judie Bonus, MD      . aspirin chewable tablet 81 mg  81 mg Oral Daily Kimberly Ballard Hammons, RPH      . calcium acetate (PHOSLO) capsule 667 mg  667 mg Oral TID WC Judie Bonus, MD   667 mg at 06/22/12 1021  . enoxaparin (LOVENOX) injection 30 mg  30 mg Subcutaneous QHS Judie Bonus, MD   30 mg at 06/22/12 0300  . fentaNYL (SUBLIMAZE) injection 12.5-25 mcg  12.5-25 mcg Intravenous Q2H PRN Judie Bonus, MD   25 mcg at 06/22/12 1021  . [START ON 06/23/2012] levofloxacin (LEVAQUIN) IVPB 500 mg  500 mg Intravenous Q48H [address] [facility]      . metoprolol (LOPRESSOR) tablet 50 mg  50 mg Oral BID Judie Bonus, MD   50 mg at 06/21/12 2347  . multivitamin (RENA-VIT) tablet 1 tablet  1 tablet Oral QHS Judie Bonus, MD   1 tablet at 06/21/12 2346  . pantoprazole (PROTONIX) EC tablet 40 mg  40 mg Oral Q1200 Judie Bonus, MD   40 mg at 06/21/12 2324  . polyethylene glycol (MIRALAX / GLYCOLAX) packet 17 g  17 g Oral Daily Judie Bonus, MD   17 g at 06/22/12 1020  . senna (SENOKOT)  tablet 8.6 mg  1 tablet Oral BID Judie Bonus, MD   8.6 mg at 06/22/12 1021  . simvastatin (ZOCOR) tablet 20 mg  20 mg Oral QHS Judie Bonus, MD   20 mg at 06/21/12 2345   Labs: Basic Metabolic Panel:  Recent Labs Lab 06/21/12 1329 06/22/12 0550  NA 137 137  K 4.3 4.1  CL 97 99  CO2 26 24  GLUCOSE 115* 104*  BUN 43* 50*  CREATININE 6.20* 7.10*  CALCIUM 9.1 8.9  PHOS  --  4.3   Liver Function Tests:  Recent Labs Lab 06/22/12 0550  ALBUMIN 2.1*   CBC:  Recent Labs Lab 06/21/12 1329 06/22/12 0550  WBC 17.9* 17.1*  NEUTROABS 15.6*  --   HGB 9.4* 9.1*  HCT 28.5* 27.2*  MCV 97.9 97.5  PLT 382 356   Studies/Results: Dg Knee Complete 4 Views Left  06/21/2012  *RADIOLOGY REPORT*  Clinical Data: Fall, medial knee pain  LEFT KNEE - COMPLETE 4+ VIEW  Comparison: 10/03/2009  Findings: Moderate to severe tricompartmental degenerative changes, most prominent in the patellofemoral compartment.  No evidence of acute fracture or dislocation.  Stable fragmentation of the osteophyte along the medial tibial plateau.  Possible small suprapatellar knee joint effusion.  Extensive vascular calcifications.  IMPRESSION: No evidence acute fracture or dislocation.  Moderate to severe tricompartmental degenerative changes.   Original Report Authenticated By: Charline Bills, M.D.    Dg Foot Complete Left  06/21/2012  *RADIOLOGY REPORT*  Clinical Data: Left foot pain, recent surgical operation, evaluate for infection  LEFT FOOT - COMPLETE 3+ VIEW  Comparison: 06/17/2012  Findings:  Stable sequela of amputation of the distal aspect of the first metatarsal.  Interval removal of overlying surgical drain. Irregular lucency about the operative site is favored to represent asymmetric residual soft tissue.  Query minimal amount of osteolysis involving the distal end of the residual first metatarsal, best appreciated on the oblique lateral radiograph.  No definite fracture or dislocation.  Multiple  hammer toe deformities are redemonstrated.  Vascular calcifications.  Rather diffuse soft tissue swelling about the lower leg and foot.  IMPRESSION: Query minimal amount of osteolysis/osteomyelitis involving the residual distal tip of the first metatarsal.  Further evaluation with MRI may be performed as clinically indicated.   Original Report Authenticated By: Tacey Ruiz, MD     ROS: + left foot pain.  + back pain. + difficulty sleeping. 10 pt ROS asked and answered. All other systems negative.   Physical Exam: Filed Vitals:   06/21/12 2151 06/21/12 2226 06/22/12 0633 06/22/12 0923  BP: 90/48  105/46 126/65  Pulse: 111  104 93  Temp: 98.1 F (36.7 C)  97.9 F (36.6 C) 98.2 F (36.8 C)  TempSrc: Oral  Oral   Resp: 17  17 18   Height:      Weight:  68.539 kg (151 lb 1.6 oz)    SpO2: 100%  98% 96%     General: Elderly, chronically ill-appearing, no acute distress. Head: Normocephalic, atraumatic, sclera non-icteric, mucus membranes are moist Neck: Supple. JVD not elevated. Lungs: Clear bilaterally to auscultation without wheezes, rales, or rhonchi. Breathing is unlabored. Heart: irreg,irreg with S1 S2. No murmurs, rubs, or gallops appreciated. Abdomen: Soft, non-tender, non-distended with normoactive bowel sounds. No rebound/guarding. No obvious abdominal masses. M-S:  Strength and tone appear normal for age. Lower extremities: Trace edema, cool, left foot with skin changes and 6x2x3 cm draining wound. Rt foot 5th toe with dry gangrene Neuro: Alert and oriented X 3. Moves all extremities spontaneously. Psych:  Responds to questions appropriately with a normal affect. Dialysis Access: Lt I-J  Failed Lt UA AVF  Dialysis Orders: Center: [place]  on MWF . EDW 72 kg HD Bath 2K/ 2Ca  Time 3:45 Heparin  3000u bolus/ 2000 u mid. Access Left IJ BFR 400 DFR A1,5    Hectorol 0 mcg IV/HD Epogen 1800   Units IV/HD  Venofer 100 mg IV through 07/01/12    Assessment/Plan: 1. Post operative  infection/ Non-healing wound to left foot - S/p amputation of left great toe. 6x2x3 cm draining wound with minimal osteomyelitis. Left BKA recommended prior to this surgery. Family awaiting consult with vascular regarding options/next steps. Currently on Levaquin per pharmacy due to many medication allergies. 2. ESRD -  MWF schedule. K+ 4.1 - HD later today. 3. Hypertension/volume  - Soft BPs 90s-120s. Metoprolol 50 BID, holding a.m.dose on HD days. Last op dose was 25 mg BID per records - consider adjusting if hypotensive on HD. 4. Anemia  - Hgb 9.1 on op Epo 1800 u. Aranesp 40 q week ordered for here. Currently on op IV Fe load through 4/11. Will continue. 5. Metabolic bone disease -  Ca 8.9 (10.4 corrected) Continue low Ca bath. P 4.3. PTH 273.9, no opVit D. 6. Nutrition - Albumin 2.1. Non-healing wound likely contributory. Renal diet, multivitamin, nepro.  Claud Kelp, PA-C [facility] Pager [phone] 06/22/2012, 10:50 AM   I have seen and examined this patient and agree with plan per Claud Kelp.  Long discussion with pt and son regarding quality of life and the amputation.  He seems to be leaning toward surgery.  If he decides not to do surgery then I would not continue with dialysis and proceed with comfort care.  Until he decides, will plan HD for today.  VVS to be in later to discuss amputation with him.  Graycen Sadlon T,MD 06/22/2012 1:32 PM

## 2012-06-22 NOTE — Procedures (Signed)
Pt seen on HD   Ap 40  Vp 90  BFR 150.  Dialysis just getting started.  SBP 120

## 2012-06-23 ENCOUNTER — Inpatient Hospital Stay (HOSPITAL_COMMUNITY): Payer: Medicare Other

## 2012-06-23 DIAGNOSIS — I70269 Atherosclerosis of native arteries of extremities with gangrene, unspecified extremity: Secondary | ICD-10-CM

## 2012-06-23 DIAGNOSIS — T8140XA Infection following a procedure, unspecified, initial encounter: Secondary | ICD-10-CM

## 2012-06-23 DIAGNOSIS — Y838 Other surgical procedures as the cause of abnormal reaction of the patient, or of later complication, without mention of misadventure at the time of the procedure: Secondary | ICD-10-CM

## 2012-06-23 LAB — CBC
HCT: 26.6 % — ABNORMAL LOW (ref 39.0–52.0)
Hemoglobin: 8.9 g/dL — ABNORMAL LOW (ref 13.0–17.0)
RBC: 2.71 MIL/uL — ABNORMAL LOW (ref 4.22–5.81)

## 2012-06-23 LAB — BASIC METABOLIC PANEL
CO2: 26 mEq/L (ref 19–32)
Chloride: 100 mEq/L (ref 96–112)
Glucose, Bld: 137 mg/dL — ABNORMAL HIGH (ref 70–99)
Potassium: 3.6 mEq/L (ref 3.5–5.1)
Sodium: 139 mEq/L (ref 135–145)

## 2012-06-23 LAB — C-REACTIVE PROTEIN: CRP: 22.8 mg/dL — ABNORMAL HIGH (ref <0.60)

## 2012-06-23 LAB — SEDIMENTATION RATE: Sed Rate: 95 mm/hr — ABNORMAL HIGH (ref 0–16)

## 2012-06-23 MED ORDER — DIPHENHYDRAMINE HCL 25 MG PO CAPS: 25.0000 mg | ORAL_CAPSULE | Freq: Four times a day (QID) | ORAL | Status: DC | PRN

## 2012-06-23 MED ORDER — HALOPERIDOL LACTATE 5 MG/ML IJ SOLN
0.5000 mg | Freq: Once | INTRAMUSCULAR | Status: DC
  Filled 2012-06-23: qty 1

## 2012-06-23 MED ORDER — DEXTROSE 5 % IV SOLN
2.0000 g | Freq: Once | INTRAVENOUS | Status: AC
  Administered 2012-06-23: 2 g via INTRAVENOUS
  Filled 2012-06-23: qty 2

## 2012-06-23 MED ORDER — NALOXONE HCL 0.4 MG/ML IJ SOLN
0.4000 mg | Freq: Once | INTRAMUSCULAR | Status: AC
  Administered 2012-06-23: 0.4 mg via INTRAVENOUS
  Filled 2012-06-23: qty 1

## 2012-06-23 MED ORDER — VANCOMYCIN HCL 10 G IV SOLR
1500.0000 mg | Freq: Once | INTRAVENOUS | Status: AC
  Administered 2012-06-23: 1500 mg via INTRAVENOUS
  Filled 2012-06-23: qty 1500

## 2012-06-23 MED ORDER — VANCOMYCIN HCL IN DEXTROSE 750-5 MG/150ML-% IV SOLN
750.0000 mg | INTRAVENOUS | Status: DC
  Administered 2012-06-24: 750 mg via INTRAVENOUS
  Filled 2012-06-23 (×3): qty 150

## 2012-06-23 MED ORDER — DEXTROSE 5 % IV SOLN
2.0000 g | INTRAVENOUS | Status: DC
  Administered 2012-06-24: 2 g via INTRAVENOUS
  Filled 2012-06-23 (×2): qty 2

## 2012-06-23 NOTE — Progress Notes (Addendum)
[facility] [place]  Progress note Subjective Gerald Tucker is a 79 y.o. male who has known severe PVD. Pt had a great toe amp on the left which is not healing. Left foot x-ray shows osteomyelitis. He also c/o right knee pain after several falls at home - no FX or osteo per x-ray.  Pt had been hanging feet over bed at night to relieve pain.  He denies calf pain but has more severe left foot pain. He also has right foot pain with dry gangrenous changes in right great toe and 4th toe  Significant Diagnostic Studies: CBC Lab Results  Component Value Date   WBC 17.1* 06/22/2012   HGB 9.1* 06/22/2012   HCT 27.2* 06/22/2012   MCV 97.5 06/22/2012   PLT 356 06/22/2012    BMET    Component Value Date/Time   NA 137 06/22/2012 0550   K 4.1 06/22/2012 0550   CL 99 06/22/2012 0550   CO2 24 06/22/2012 0550   GLUCOSE 104* 06/22/2012 0550   BUN 50* 06/22/2012 0550   CREATININE 7.10* 06/22/2012 0550   CALCIUM 8.9 06/22/2012 0550   CALCIUM 9.1 04/01/2009 2202   GFRNONAA 6* 06/22/2012 0550   GFRAA 7* 06/22/2012 0550     Intake/Output Summary (Last 24 hours) at 06/23/12 1012 Last data filed at 06/23/12 0852  Gross per 24 hour  Intake    150 ml  Output   2000 ml  Net  -1850 ml   Patient Vitals for the past 24 hrs:  Urine Occurrence  06/22/12 1445 1     Physical Examination  BP Readings from Last 3 Encounters:  06/23/12 114/53  06/07/12 148/70  05/17/12 94/48   Temp Readings from Last 3 Encounters:  06/23/12 99.5 F (37.5 C) Oral  05/08/12 98.7 F (37.1 C) Oral  01/04/12 98 F (36.7 C) Oral   SpO2 Readings from Last 3 Encounters:  06/23/12 96%  05/17/12 88%  05/08/12 95%   Pulse Readings from Last 3 Encounters:  06/23/12 76  06/07/12 96  05/17/12 83    Pt is A&Ox3  WDWN male with C/O severe pain in right foot  Right great toe amputation wound is necrotic with foul smelling tissue at base. The calf is soft and warm, non tender  Assessment/plan:  Gerald Tucker is a 79  y.o. male who is s/p Right great toe amp which is necrotic with osteomyelitis in foot. Pt having severe pain in the foot and is ready to proceed with amputation Pt had HD late last night Finished eating at 8:30am today Pt will need Left BKA vs AKA - surgeon to assess  ROCZNIAK,REGINA J 10:12 AM 06/23/[phone]  I agree with the above. I spoke with the patient's son who is at the bedside. The plan will be for a below knee amputation. I did discuss that if the tissue was clearly not going to heal a below knee amputation, that we would proceed with an above-knee amputation the same setting. The family is on board with this plan as the patient is having severe pain in his foot.  Wells Doriana Mazurkiewicz 

## 2012-06-23 NOTE — Progress Notes (Signed)
[facility] Progress Note  Subjective:   Combative, agitated, confused last evening per son and notes. Today still confused, 2-pt restraints adding frustration. Unable to complete ROS.  Objective Filed Vitals:   06/22/12 2100 06/22/12 2129 06/22/12 2159 06/23/12 0926  BP: 113/41 117/60 115/69 114/53  Pulse: 112 115 66 76  Temp:  98.5 F (36.9 C) 99 F (37.2 C) 99.5 F (37.5 C)  TempSrc:  Oral Oral Oral  Resp:  18 17 17   Height:      Weight:  70.2 kg (154 lb 12.2 oz) 70.8 kg (156 lb 1.4 oz)   SpO2:  94% 100% 96%   Physical Exam General: Elderly, confused, frustrated, NAD Heart: RRR Lungs: CTA bilaterally. No wheeze, rales, or rhonchi Abdomen: Soft, NT, normal BS Extremities: Trace LE edema, Left foot wrapped Dialysis Access: Lt I-J  Dialysis Orders: Center: [place] on MWF .  EDW 72 kg HD Bath 2K/ 2Ca Time 3:45 Heparin 3000u bolus/ 2000 u mid. Access Left IJ BFR 400 DFR A1,5  Hectorol 0 mcg IV/HD Epogen 1800 Units IV/HD Venofer 100 mg IV through 07/01/12   Assessment/Plan: 1. Post operative infection/ Non-healing wound to left foot - S/p amputation of left great toe. 6x2x3 cm draining wound with minimal osteomyelitis. Left BKA recommended prior to this surgery. Family awaiting consult with vascular regarding options/next steps. Son states he is amenable to surgery. Currently on Levaquin per pharmacy due to many medication allergies. 2. Right foot eschar to 4th digit - No osteo on imaging. 3. Delirium - Pain medication and infection possibly contributing. Med adjustment per primary 4. ESRD - MWF schedule. K+ 4.1, HD tomorrow 5. Hypertension/volume - BPs 110s. Metoprolol 50 BID, holding a.m.dose on HD days. Last op dose was 25 mg BID per records - consider adjusting if hypotensive on HD. Net UF 2L yesterday.  6. Anemia - Hgb 9.1 on op Epo 1800 u. Aranesp 40 q Wed HD. IV Fe load through 4/11.  7. Metabolic bone disease - Ca 8.9 (10.4 corrected) Continue low Ca bath. P  4.3. PTH 273.9, no opVit D. Phoslo for binder. 8. Nutrition - Albumin 2.1. Non-healing wound likely contributory. Renal diet, multivitamin, nepro.    Scot Jun. Thad Ranger [facility] Pager [phone] 06/23/2012,9:32 AM  LOS: 2 days  I have seen and examined this patient and agree with plan per Claud Kelp.  He is agreeable to BKA/AKA.  Plan HD tomorrow. ? Surgery today vs tomorrow. Lorra Freeman T,MD 06/23/2012 10:21 AM  Additional Objective Labs: Basic Metabolic Panel:  Recent Labs Lab 06/21/12 1329 06/22/12 0550  NA 137 137  K 4.3 4.1  CL 97 99  CO2 26 24  GLUCOSE 115* 104*  BUN 43* 50*  CREATININE 6.20* 7.10*  CALCIUM 9.1 8.9  PHOS  --  4.3   Liver Function Tests:  Recent Labs Lab 06/22/12 0550  ALBUMIN 2.1*   CBC:  Recent Labs Lab 06/21/12 1329 06/22/12 0550  WBC 17.9* 17.1*  NEUTROABS 15.6*  --   HGB 9.4* 9.1*  HCT 28.5* 27.2*  MCV 97.9 97.5  PLT 382 356   Blood Culture    Component Value Date/Time   SDES URINE, CLEAN CATCH 01/01/2012 1623   SPECREQUEST NONE 01/01/2012 1623   CULT Multiple bacterial morphotypes present, none predominant. Suggest appropriate recollection if clinically indicated. 01/01/2012 1623   REPTSTATUS 01/03/2012 FINAL 01/01/2012 1623    Studies/Results: Dg Knee Complete 4 Views Left  06/21/2012  *RADIOLOGY REPORT*  Clinical Data: Fall, medial  knee pain  LEFT KNEE - COMPLETE 4+ VIEW  Comparison: 10/03/2009  Findings: Moderate to severe tricompartmental degenerative changes, most prominent in the patellofemoral compartment.  No evidence of acute fracture or dislocation.  Stable fragmentation of the osteophyte along the medial tibial plateau.  Possible small suprapatellar knee joint effusion.  Extensive vascular calcifications.  IMPRESSION: No evidence acute fracture or dislocation.  Moderate to severe tricompartmental degenerative changes.   Original Report Authenticated By: Charline Bills, M.D.    Dg Foot  Complete Left  06/21/2012  *RADIOLOGY REPORT*  Clinical Data: Left foot pain, recent surgical operation, evaluate for infection  LEFT FOOT - COMPLETE 3+ VIEW  Comparison: 06/17/2012  Findings:  Stable sequela of amputation of the distal aspect of the first metatarsal.  Interval removal of overlying surgical drain. Irregular lucency about the operative site is favored to represent asymmetric residual soft tissue.  Query minimal amount of osteolysis involving the distal end of the residual first metatarsal, best appreciated on the oblique lateral radiograph.  No definite fracture or dislocation.  Multiple hammer toe deformities are redemonstrated.  Vascular calcifications.  Rather diffuse soft tissue swelling about the lower leg and foot.  IMPRESSION: Query minimal amount of osteolysis/osteomyelitis involving the residual distal tip of the first metatarsal.  Further evaluation with MRI may be performed as clinically indicated.   Original Report Authenticated By: Tacey Ruiz, MD    Dg Foot Complete Right  06/23/2012  *RADIOLOGY REPORT*  Clinical Data: Possible osteomyelitis.  RIGHT FOOT COMPLETE - 3+ VIEW  Comparison: February 16, 2012.  Findings: No fracture or dislocation is noted.  Arterial calcifications are noted suggesting diabetes.  Mild spurring of posterior calcaneus is noted.  No lytic destruction is seen to suggest osteomyelitis.  Mild degenerative changes seen involving the first interphalangeal joint as well as the fifth metatarsophalangeal joint.  IMPRESSION: No lytic destruction is seen to suggest osteomyelitis.   Original Report Authenticated By: Lupita Raider.,  M.D.    Medications:   . alteplase  4 mg Intracatheter Once  . aspirin  81 mg Oral Daily  . calcium acetate  667 mg Oral TID WC  . darbepoetin (ARANESP) injection - DIALYSIS  40 mcg Intravenous Q Wed-HD  . enoxaparin (LOVENOX) injection  30 mg Subcutaneous QHS  . feeding supplement (NEPRO CARB STEADY)  237 mL Oral BID BM  . ferric  gluconate (FERRLECIT/NULECIT) IV  125 mg Intravenous Q M,W,F-HD  . haloperidol lactate  0.5 mg Intravenous Once  . metoprolol  50 mg Oral BID  . multivitamin  1 tablet Oral QHS  . pantoprazole  40 mg Oral Q1200  . polyethylene glycol  17 g Oral Daily  . senna  1 tablet Oral BID  . simvastatin  20 mg Oral QHS

## 2012-06-23 NOTE — Progress Notes (Signed)
Called by RN patient agitated, confused, trying to hit staff and get out of bed.  He had 2 doses of Fentanyl.  Son Gerald Tucker) at bedside requesting to see doctor.  His father is talking to his dead wife and very confused which he states happens with morphine normally.    S: patient thinks he is at home.  He is being uncooperative with questions being asked telling MD to get out and that he was trying to find his shot gun O: Psych: patient is confused, combative, agitated  A/P Acute encephalopathy could be delerium related to Fentanyl as you are to use caution in the elderly.  Also could be related to underlying infection Primary team to review pain medication choice.  Will try a dose of Narcan 0.4 mg x 1.  Will try Haldol 0.5 mg x 1  [place] MD [phone]

## 2012-06-23 NOTE — Progress Notes (Signed)
[facility]    Subjective: Complaining of pain in his foot   Antibiotics:  Anti-infectives   Start     Dose/Rate Route Frequency Ordered Stop   06/23/12 1600  levofloxacin (LEVAQUIN) IVPB 500 mg  Status:  Discontinued     500 mg 100 mL/hr over 60 Minutes Intravenous Every 48 hours 06/21/12 1456 06/22/12 1827   06/21/12 1500  levofloxacin (LEVAQUIN) IVPB 750 mg     750 mg 100 mL/hr over 90 Minutes Intravenous  Once 06/21/12 1456 06/21/12 1725      Medications: Scheduled Meds: . alteplase  4 mg Intracatheter Once  . aspirin  81 mg Oral Daily  . calcium acetate  667 mg Oral TID WC  . darbepoetin (ARANESP) injection - DIALYSIS  40 mcg Intravenous Q Wed-HD  . enoxaparin (LOVENOX) injection  30 mg Subcutaneous QHS  . feeding supplement (NEPRO CARB STEADY)  237 mL Oral BID BM  . ferric gluconate (FERRLECIT/NULECIT) IV  125 mg Intravenous Q M,W,F-HD  . haloperidol lactate  0.5 mg Intravenous Once  . metoprolol  50 mg Oral BID  . multivitamin  1 tablet Oral QHS  . pantoprazole  40 mg Oral Q1200  . polyethylene glycol  17 g Oral Daily  . senna  1 tablet Oral BID  . simvastatin  20 mg Oral QHS   Continuous Infusions:  PRN Meds:.acetaminophen, fentaNYL   Objective: Weight change: -3 lb 4.9 oz (-1.5 kg)  Intake/Output Summary (Last 24 hours) at 06/23/12 1121 Last data filed at 06/23/12 0852  Gross per 24 hour  Intake    150 ml  Output   2000 ml  Net  -1850 ml   Blood pressure 114/53, pulse 76, temperature 99.5 F (37.5 C), temperature source Oral, resp. rate 17, height 5' 10.08" (1.78 m), weight 156 lb 1.4 oz (70.8 kg), SpO2 96.00%. Temp:  [97.7 F (36.5 C)-99.5 F (37.5 C)] 99.5 F (37.5 C) (04/03 0926) Pulse Rate:  [66-115] 76 (04/03 0926) Resp:  [16-18] 17 (04/03 0926) BP: (109-156)/(41-85) 114/53 mmHg (04/03 0926) SpO2:  [94 %-100 %] 96 % (04/03 0926) Weight:  [154 lb 12.2 oz (70.2 kg)-156 lb 1.4 oz (70.8 kg)] 156 lb 1.4 oz (70.8 kg)  (04/02 2159)  Physical Exam: General: Alert and awake, oriented x not in any acute distress.  HEENT: anicteric sclera, pupils reactive to light and accommodation, EOMI, oropharynx clear and without exudate  CVS regular rate, normal r, no murmur rubs or gallops  Chest: clear to auscultation bilaterally, no wheezing, rales or rhonchi  Abdomen: soft nontender, nondistended, normal bow  Extremities: bandaged today  Lab Results:  Recent Labs  06/21/12 1329 06/22/12 0550  WBC 17.9* 17.1*  HGB 9.4* 9.1*  HCT 28.5* 27.2*  PLT 382 356    BMET  Recent Labs  06/21/12 1329 06/22/12 0550  NA 137 137  K 4.3 4.1  CL 97 99  CO2 26 24  GLUCOSE 115* 104*  BUN 43* 50*  CREATININE 6.20* 7.10*  CALCIUM 9.1 8.9    Micro Results: Recent Results (from the past 240 hour(s))  MRSA PCR SCREENING     Status: None   Collection Time    06/22/12  8:06 AM      Result Value Range Status   MRSA by PCR NEGATIVE  NEGATIVE Final   Comment:            The GeneXpert MRSA Assay (FDA     approved for NASAL specimens  only), is one component of a     comprehensive MRSA colonization     surveillance program. It is not     intended to diagnose MRSA     infection nor to guide or     monitor treatment for     MRSA infections.    Studies/Results: Dg Knee Complete 4 Views Left  06/21/2012  *RADIOLOGY REPORT*  Clinical Data: Fall, medial knee pain  LEFT KNEE - COMPLETE 4+ VIEW  Comparison: 10/03/2009  Findings: Moderate to severe tricompartmental degenerative changes, most prominent in the patellofemoral compartment.  No evidence of acute fracture or dislocation.  Stable fragmentation of the osteophyte along the medial tibial plateau.  Possible small suprapatellar knee joint effusion.  Extensive vascular calcifications.  IMPRESSION: No evidence acute fracture or dislocation.  Moderate to severe tricompartmental degenerative changes.   Original Report Authenticated By: Charline Bills, M.D.    Dg Foot  Complete Left  06/21/2012  *RADIOLOGY REPORT*  Clinical Data: Left foot pain, recent surgical operation, evaluate for infection  LEFT FOOT - COMPLETE 3+ VIEW  Comparison: 06/17/2012  Findings:  Stable sequela of amputation of the distal aspect of the first metatarsal.  Interval removal of overlying surgical drain. Irregular lucency about the operative site is favored to represent asymmetric residual soft tissue.  Query minimal amount of osteolysis involving the distal end of the residual first metatarsal, best appreciated on the oblique lateral radiograph.  No definite fracture or dislocation.  Multiple hammer toe deformities are redemonstrated.  Vascular calcifications.  Rather diffuse soft tissue swelling about the lower leg and foot.  IMPRESSION: Query minimal amount of osteolysis/osteomyelitis involving the residual distal tip of the first metatarsal.  Further evaluation with MRI may be performed as clinically indicated.   Original Report Authenticated By: Tacey Ruiz, MD    Dg Foot Complete Right  06/23/2012  *RADIOLOGY REPORT*  Clinical Data: Possible osteomyelitis.  RIGHT FOOT COMPLETE - 3+ VIEW  Comparison: February 16, 2012.  Findings: No fracture or dislocation is noted.  Arterial calcifications are noted suggesting diabetes.  Mild spurring of posterior calcaneus is noted.  No lytic destruction is seen to suggest osteomyelitis.  Mild degenerative changes seen involving the first interphalangeal joint as well as the fifth metatarsophalangeal joint.  IMPRESSION: No lytic destruction is seen to suggest osteomyelitis.   Original Report Authenticated By: Lupita Raider.,  M.D.       Assessment/Plan: Gerald Tucker is a 79 y.o. male  with gangrenous toe that is nonviable now with persistent gangrene in the wound bed after amputation of the toe who is in need of more proximal amputation. He also has evidence of poor blood flow to his opposite foot where he apparently has had purulent drainage in the past.    #1 Necrotic nonviable toe ulcer bed:  --RECOMMEND BKA VS AKA  --WOULD GET I intraoperative cultures from the infected site -- would cover him postoperatively with IV vancomycin and Ceftazidime (note review of echart indicates he tolerates both abx fine)  --would continue these for 48 hours post surgery   #2 ? OSteo in the opposite toe:  plain films without ostEO,. Would consider MRI as more sensitive test.   LOS: 2 days   [name] 06/23/2012, 11:21 AM

## 2012-06-23 NOTE — Progress Notes (Signed)
Patient  Confused   Combative   Attempting to get out of  Bed  Without  Assistance.  At this time  Patient unable to be  Reoriented.  Son   Simona Huh  So  Nice to come back  And  Assist   With  Dads   Confusion/agitation.    Will  Call   MD  Per  Family  Request    , patient  Swinging at  [facility]  And  Staff  Member.

## 2012-06-23 NOTE — Progress Notes (Signed)
Subjective:  Patient was agitated and combative overnight, given narcan and placed in soft wrist restraints. Family states that he frequently become agitated and disoriented in hospitals and with pain medications.  Mr. Grimaldo is complaining of foot pain and back pain this morning.   Objective: Vital signs in last 24 hours: Filed Vitals:   06/22/12 2030 06/22/12 2100 06/22/12 2129 06/22/12 2159  BP: 141/72 113/41 117/60 115/69  Pulse: 114 112 115 66  Temp:   98.5 F (36.9 C) 99 F (37.2 C)  TempSrc:   Oral Oral  Resp:   18 17  Height:      Weight:   154 lb 12.2 oz (70.2 kg) 156 lb 1.4 oz (70.8 kg)  SpO2:   94% 100%   Weight change: -3 lb 4.9 oz (-1.5 kg)  Intake/Output Summary (Last 24 hours) at 06/23/12 0857 Last data filed at 06/23/12 0852  Gross per 24 hour  Intake    270 ml  Output   2000 ml  Net  -1730 ml    Physical Exam Blood pressure 115/69, pulse 66, temperature 99 F (37.2 C), temperature source Oral, resp. rate 17, height 5' 10.08" (1.78 m), weight 156 lb 1.4 oz (70.8 kg), SpO2 100.00%. General: No acute distress, alert and oriented x 3, appears stated age  HEENT: PERRL, EOMI, moist mucous membranes  Cardiovascular: Irregular rhythm, rate in the 90s, soft blowing systolic murmur  Respiratory: Clear to auscultation bilaterally, no wheezes, rales, or rhonchi  Abdomen: Soft, nondistended, nontender, bowel sounds present  Extremities: Left upper extremity with aVF. Left lower extremity warm, erythematous to dorsum, wound bed with purulent drainage and surrounding necrosis. Foul odor noted. RLE with stable dry gangrene over R 4th toe and blister over R great toe.  Neuro: Not anxious appearing, no depressed mood, normal affect.    Lab Results: Basic Metabolic Panel:  Recent Labs Lab 06/21/12 1329 06/22/12 0550  NA 137 137  K 4.3 4.1  CL 97 99  CO2 26 24  GLUCOSE 115* 104*  BUN 43* 50*  CREATININE 6.20* 7.10*  CALCIUM 9.1 8.9  PHOS  --  4.3   Liver  Function Tests:  Recent Labs Lab 06/22/12 0550  ALBUMIN 2.1*   CBC:  Recent Labs Lab 06/21/12 1329 06/22/12 0550  WBC 17.9* 17.1*  NEUTROABS 15.6*  --   HGB 9.4* 9.1*  HCT 28.5* 27.2*  MCV 97.9 97.5  PLT 382 356   Studies/Results: Dg Knee Complete 4 Views Left  06/21/2012  *RADIOLOGY REPORT*  Clinical Data: Fall, medial knee pain  LEFT KNEE - COMPLETE 4+ VIEW  Comparison: 10/03/2009  Findings: Moderate to severe tricompartmental degenerative changes, most prominent in the patellofemoral compartment.  No evidence of acute fracture or dislocation.  Stable fragmentation of the osteophyte along the medial tibial plateau.  Possible small suprapatellar knee joint effusion.  Extensive vascular calcifications.  IMPRESSION: No evidence acute fracture or dislocation.  Moderate to severe tricompartmental degenerative changes.   Original Report Authenticated By: Charline Bills, M.D.    Dg Foot Complete Left  06/21/2012  *RADIOLOGY REPORT*  Clinical Data: Left foot pain, recent surgical operation, evaluate for infection  LEFT FOOT - COMPLETE 3+ VIEW  Comparison: 06/17/2012  Findings:  Stable sequela of amputation of the distal aspect of the first metatarsal.  Interval removal of overlying surgical drain. Irregular lucency about the operative site is favored to represent asymmetric residual soft tissue.  Query minimal amount of osteolysis involving the distal end of the residual  first metatarsal, best appreciated on the oblique lateral radiograph.  No definite fracture or dislocation.  Multiple hammer toe deformities are redemonstrated.  Vascular calcifications.  Rather diffuse soft tissue swelling about the lower leg and foot.  IMPRESSION: Query minimal amount of osteolysis/osteomyelitis involving the residual distal tip of the first metatarsal.  Further evaluation with MRI may be performed as clinically indicated.   Original Report Authenticated By: Tacey Ruiz, MD    Medications:  Medications  reviewed  Scheduled Meds: . alteplase  4 mg Intracatheter Once  . aspirin  81 mg Oral Daily  . calcium acetate  667 mg Oral TID WC  . darbepoetin (ARANESP) injection - DIALYSIS  40 mcg Intravenous Q Wed-HD  . enoxaparin (LOVENOX) injection  30 mg Subcutaneous QHS  . feeding supplement (NEPRO CARB STEADY)  237 mL Oral BID BM  . ferric gluconate (FERRLECIT/NULECIT) IV  125 mg Intravenous Q M,W,F-HD  . haloperidol lactate  0.5 mg Intravenous Once  . metoprolol  50 mg Oral BID  . multivitamin  1 tablet Oral QHS  . pantoprazole  40 mg Oral Q1200  . polyethylene glycol  17 g Oral Daily  . senna  1 tablet Oral BID  . simvastatin  20 mg Oral QHS   Continuous Infusions:  PRN Meds:.acetaminophen, fentaNYL  Assessment/Plan:  Mr. Cruey is a 79 year old gentleman with a history of ESRD on HD (MWF), atrial fibrillation, and peripheral vascular disease with recent amputation of gangrenous left great toe one week ago. He presents directly from his surgeon's office where he was seen for a followup visit and directed to come to the ED for evaluation of pain, swelling and purulent discharge from his left foot wound.   Nonhealing left foot wound  In setting of severe PVD, s/p L great toe amputation 1 week ago.  Ankle arm index on 06/07/12 was 0.58 on the right and 0.56 on the left with monophasic waveforms. Arteriography from December 2010 showed severe unreconstructable tibial vessel disease.  4/3: Family agreeable to surgery  -BKA vs AKA per vascular surgery team -appreciate ID input -Fentanyl PRN for pain   Agitation and Delirium  Patient with known sensitivities to narcotics and prone to hospital delirium, not new per family. Current infection can make this worse. -prudence with pain medications, balancing pain control with side effects -call MD if needed for PRN haldol  ESRD on HD (MWF)  Creatinine 6.2, BUN 43 on admission. Hemoglobin 9.4. Last dialysis was the day before admission.  -cont  HD per renal -Continue PhosLo  -follow renal function panel  Atrial fibrillation without RVR  In Afib without RVR on admission. Patient is only on aspirin at home. Chads 2 score of 2.  Risk for RVR increased given current infection. Cont to monitor.  -Continue aspirin, metoprolol   Chronic back pain Patient with worsening back pain due to the uncomfortable and small hospital bed. He has struggled for >20 years with chronic back pain. We discussed using his PRN pain medications and reminded him that he needs to ask his nurses for pain medications. Offered extra pillows. -continue PRN fentanyl  Constipation  BM yesterday -cont MiraLax and senna  -encourage oral hydration  Hypertension  Blood pressure was 83/37 initially on presentation. 114/53 this morning -Hold home imdur in setting of low blood pressure -Continue metoprolol   DVT  -Lovenox   FEN  -Normal saline lock  -Renal diet   CODE STATUS  -Patient's son is healthcare power of attorney  Dispo  Anticipate dc in 2-4 days.  Patient does have a PCP and will not be needing OPC f/u     LOS: 2 days   [place] Ar 06/23/2012, 8:57 AM

## 2012-06-23 NOTE — Progress Notes (Signed)
Pt family requested wrist restraints off while they are in room. Pt calm at present and requested them off. Restraints removed.

## 2012-06-23 NOTE — Progress Notes (Signed)
ANTIBIOTIC CONSULT NOTE - INITIAL  Pharmacy Consult for Vancomycin & Ceftazidime  Indication: osteomylitis   Allergies  Allergen Reactions  . Penicillins Rash    "felt like on fire"  . Morphine And Related     Agitated and sees things  . Amoxicillin     Pt reported unknown reaction  . Darvocet (Propoxyphene-Acetaminophen)     Agitated and sees things  . Keflex (Cephalexin) Other (See Comments)    Pt reported unknown reaction  . Neurontin (Gabapentin)     Agitated and confused  . Ultram (Tramadol Hcl)     Agitated and sees things. Can take small amounts  . Vancomycin Itching    Vital Signs: Temp: 98.7 F (37.1 C) (04/03 1348) Temp src: Oral (04/03 1348) BP: 125/38 mmHg (04/03 1348) Pulse Rate: 97 (04/03 1348)   Recent Labs  06/21/12 1329 06/22/12 0550 06/23/12 1047  WBC 17.9* 17.1* 21.8*  HGB 9.4* 9.1* 8.9*  PLT 382 356 317  CREATININE 6.20* 7.10* 4.81*   Estimated Creatinine Clearance: 9.8 ml/min (by C-G formula based on Cr of 4.81).  Microbiology: Recent Results (from the past 720 hour(s))  MRSA PCR SCREENING     Status: None   Collection Time    06/22/12  8:06 AM      Result Value Range Status   MRSA by PCR NEGATIVE  NEGATIVE Final   Comment:            The GeneXpert MRSA Assay (FDA     approved for NASAL specimens     only), is one component of a     comprehensive MRSA colonization     surveillance program. It is not     intended to diagnose MRSA     infection nor to guide or     monitor treatment for     MRSA infections.    Assessment: 74yoM with ESRD MWF s/p left great toe amputation with discussion of possible BKA vs AKA now being started on Vancomycin and Ceftazidime per ID recommendation. Noted patient listed allergy of "itching" on vancomycin and "unknown" reaction to cephalexin. Discussed with patient's son --patient's son reported that cephalexin should be ok.  Per ID consult note, Dr. Algis Liming is aware of allergy and noted that patient has  reportedly tolerated both antibiotics previously per eChart records. Dr. Dorise Hiss also aware and will add benadryl PRN in case of any itching. Will make RN aware to watch for any signs of allergic reaction.  WBC 21.8, Tmax 99.5, Wt 70.8kg  Antibiotics Levaquin 4/1 >> 4/2 Ceftazidime 4/3 >> Vancomycin 4/3 >>  Goal of Therapy:  Pre-HD vancomycin 15-25 mgc/ml Clinical Improvement Eradication of infection  Plan:  1) Vancomycin IV 1500mg  x 1, then vancomycin 750mg  IV qHD-MWF  2) Ceftazidime 2gm x 1 tonight, then ceftazidime 2gm IV qHD-MWF 3) F/u HD schedule/toleration, cultures, plans for BKA vs AKA, clinical status, LOT 4) Monitor for any signs/symptoms of allergies  Benjaman Pott, PharmD, BCPS 06/23/2012   4:14 PM

## 2012-06-23 NOTE — Progress Notes (Signed)
Internal Medicine Teaching Service Attending Note Date: 06/23/2012  Patient name: Gerald Tucker  Medical record number: [id number]  Date of birth: 09/07/1918    This patient has been seen and discussed with the house staff. Please see their note for complete details. I concur with their findings with the following additions/corrections: patient to undergo surgery as per recommendations by vascular surgery, BKA vs AKA, right foot plan films does not show any bony changes. I will switch his antibiotics to vancomycin and cetazidime today although I doubt it will make a lot of difference in overall management as the definitive management at this time is surgical removal for the infected foot. The Patient has been delirious today which is likely infectious related encephalopathy. Patient has had history of similar delirium in the past most notably he was "almost out" for 2 weeks during one of the recent hospitalizations as per the 2 sons who are present at bedside.  Lars Mage 06/23/2012, 2:33 PM

## 2012-06-24 ENCOUNTER — Inpatient Hospital Stay (HOSPITAL_COMMUNITY): Payer: Medicare Other | Admitting: Anesthesiology

## 2012-06-24 ENCOUNTER — Encounter (HOSPITAL_COMMUNITY): Payer: Self-pay | Admitting: Anesthesiology

## 2012-06-24 ENCOUNTER — Encounter (HOSPITAL_COMMUNITY)
Admission: EM | Disposition: A | Discharge status: Skilled Nursing Facility | Payer: Self-pay | Source: Home / Self Care | Attending: Internal Medicine

## 2012-06-24 HISTORY — PX: AMPUTATION: SHX166

## 2012-06-24 LAB — BASIC METABOLIC PANEL
BUN: 46 mg/dL — ABNORMAL HIGH (ref 6–23)
Chloride: 102 mEq/L (ref 96–112)
GFR calc Af Amer: 8 mL/min — ABNORMAL LOW (ref >90)
GFR calc non Af Amer: 7 mL/min — ABNORMAL LOW (ref >90)
Potassium: 4.3 mEq/L (ref 3.5–5.1)
Sodium: 141 mEq/L (ref 135–145)

## 2012-06-24 LAB — RENAL FUNCTION PANEL
BUN: 46 mg/dL — ABNORMAL HIGH (ref 6–23)
CO2: 23 mEq/L (ref 19–32)
Chloride: 103 mEq/L (ref 96–112)
Glucose, Bld: 73 mg/dL (ref 70–99)
Potassium: 4.3 mEq/L (ref 3.5–5.1)

## 2012-06-24 LAB — CBC
HCT: 26.3 % — ABNORMAL LOW (ref 39.0–52.0)
Hemoglobin: 8.9 g/dL — ABNORMAL LOW (ref 13.0–17.0)
MCHC: 33.8 g/dL (ref 30.0–36.0)
RBC: 2.66 MIL/uL — ABNORMAL LOW (ref 4.22–5.81)

## 2012-06-24 SURGERY — AMPUTATION BELOW KNEE
Anesthesia: General | Site: Leg Lower | Laterality: Left | Wound class: Clean

## 2012-06-24 MED ORDER — SODIUM CHLORIDE 0.9 % IV SOLN
INTRAVENOUS | Status: DC | PRN
  Administered 2012-06-24: 10:00:00 via INTRAVENOUS

## 2012-06-24 MED ORDER — OXYCODONE HCL 5 MG/5ML PO SOLN: 5.0000 mg | Freq: Once | ORAL | Status: DC | PRN

## 2012-06-24 MED ORDER — PHENYLEPHRINE HCL 10 MG/ML IJ SOLN
INTRAMUSCULAR | Status: DC | PRN
  Administered 2012-06-24: 80 ug via INTRAVENOUS
  Administered 2012-06-24 (×2): 120 ug via INTRAVENOUS
  Administered 2012-06-24: 80 ug via INTRAVENOUS

## 2012-06-24 MED ORDER — LIDOCAINE HCL (CARDIAC) 20 MG/ML IV SOLN
INTRAVENOUS | Status: DC | PRN
  Administered 2012-06-24: 100 mg via INTRAVENOUS

## 2012-06-24 MED ORDER — PROMETHAZINE HCL 25 MG/ML IJ SOLN: 6.2500 mg | INTRAMUSCULAR | Status: DC | PRN

## 2012-06-24 MED ORDER — ONDANSETRON HCL 4 MG/2ML IJ SOLN: 4.0000 mg | Freq: Four times a day (QID) | INTRAMUSCULAR | Status: DC | PRN

## 2012-06-24 MED ORDER — SODIUM CHLORIDE 0.9 % IV SOLN
INTRAVENOUS | Status: DC
  Administered 2012-06-24: 10:00:00 via INTRAVENOUS

## 2012-06-24 MED ORDER — SODIUM CHLORIDE 0.9 % IR SOLN
Status: DC | PRN
  Administered 2012-06-24: 1

## 2012-06-24 MED ORDER — FENTANYL CITRATE 0.05 MG/ML IJ SOLN
INTRAMUSCULAR | Status: DC | PRN
  Administered 2012-06-24: 100 ug via INTRAVENOUS
  Administered 2012-06-24 (×2): 25 ug via INTRAVENOUS

## 2012-06-24 MED ORDER — ESMOLOL HCL 10 MG/ML IV SOLN
INTRAVENOUS | Status: DC | PRN
  Administered 2012-06-24: 20 mg via INTRAVENOUS
  Administered 2012-06-24 (×2): 10 mg via INTRAVENOUS
  Administered 2012-06-24: 20 mg via INTRAVENOUS

## 2012-06-24 MED ORDER — PROPOFOL 10 MG/ML IV BOLUS
INTRAVENOUS | Status: DC | PRN
  Administered 2012-06-24: 100 mg via INTRAVENOUS

## 2012-06-24 MED ORDER — POTASSIUM CHLORIDE CRYS ER 20 MEQ PO TBCR: 20.0000 meq | EXTENDED_RELEASE_TABLET | Freq: Once | ORAL | Status: AC | PRN

## 2012-06-24 MED ORDER — SODIUM CHLORIDE 0.9 % IJ SOLN: 3.0000 mL | INTRAMUSCULAR | Status: DC | PRN

## 2012-06-24 MED ORDER — LACTATED RINGERS IV SOLN: INTRAVENOUS | Status: DC

## 2012-06-24 MED ORDER — SODIUM CHLORIDE 0.9 % IJ SOLN
3.0000 mL | Freq: Two times a day (BID) | INTRAMUSCULAR | Status: DC
  Administered 2012-06-25 – 2012-06-29 (×9): 3 mL via INTRAVENOUS

## 2012-06-24 MED ORDER — OXYCODONE HCL 5 MG PO TABS: 5.0000 mg | ORAL_TABLET | Freq: Once | ORAL | Status: DC | PRN

## 2012-06-24 MED ORDER — HYDROMORPHONE HCL PF 1 MG/ML IJ SOLN: 0.2500 mg | INTRAMUSCULAR | Status: DC | PRN

## 2012-06-24 MED ORDER — SODIUM CHLORIDE 0.9 % IV SOLN: 250.0000 mL | INTRAVENOUS | Status: DC | PRN

## 2012-06-24 MED ORDER — SODIUM CHLORIDE 0.9 % IV SOLN
10.0000 mg | INTRAVENOUS | Status: DC | PRN
  Administered 2012-06-24: 40 ug/min via INTRAVENOUS

## 2012-06-24 MED ORDER — FENTANYL CITRATE 0.05 MG/ML IJ SOLN
INTRAMUSCULAR | Status: AC
  Administered 2012-06-24: 12.5 ug via INTRAVENOUS
  Filled 2012-06-24: qty 2

## 2012-06-24 MED ORDER — MAGNESIUM SULFATE 40 MG/ML IJ SOLN
2.0000 g | Freq: Once | INTRAMUSCULAR | Status: AC | PRN
  Filled 2012-06-24: qty 50

## 2012-06-24 SURGICAL SUPPLY — 43 items
BANDAGE ELASTIC 4 VELCRO ST LF (GAUZE/BANDAGES/DRESSINGS) IMPLANT
BANDAGE ELASTIC 6 VELCRO ST LF (GAUZE/BANDAGES/DRESSINGS) ×2 IMPLANT
BANDAGE ESMARK 6X9 LF (GAUZE/BANDAGES/DRESSINGS) IMPLANT
BANDAGE GAUZE ELAST BULKY 4 IN (GAUZE/BANDAGES/DRESSINGS) ×2 IMPLANT
BNDG ESMARK 6X9 LF (GAUZE/BANDAGES/DRESSINGS)
CANISTER SUCTION 2500CC (MISCELLANEOUS) ×2 IMPLANT
CLIP LIGATING EXTRA MED SLVR (CLIP) ×2 IMPLANT
CLIP LIGATING EXTRA SM BLUE (MISCELLANEOUS) ×2 IMPLANT
CLOTH BEACON ORANGE TIMEOUT ST (SAFETY) ×2 IMPLANT
COVER SURGICAL LIGHT HANDLE (MISCELLANEOUS) ×2 IMPLANT
CUFF TOURNIQUET SINGLE 34IN LL (TOURNIQUET CUFF) IMPLANT
CUFF TOURNIQUET SINGLE 44IN (TOURNIQUET CUFF) IMPLANT
DRAIN SNY 10X20 3/4 PERF (WOUND CARE) IMPLANT
DRAPE ORTHO SPLIT 77X108 STRL (DRAPES) ×2
DRAPE PROXIMA HALF (DRAPES) ×2 IMPLANT
DRAPE SURG ORHT 6 SPLT 77X108 (DRAPES) ×2 IMPLANT
ELECT REM PT RETURN 9FT ADLT (ELECTROSURGICAL) ×2
ELECTRODE REM PT RTRN 9FT ADLT (ELECTROSURGICAL) ×1 IMPLANT
EVACUATOR SILICONE 100CC (DRAIN) IMPLANT
GAUZE XEROFORM 5X9 LF (GAUZE/BANDAGES/DRESSINGS) ×2 IMPLANT
GLOVE SS BIOGEL STRL SZ 7.5 (GLOVE) ×1 IMPLANT
GLOVE SUPERSENSE BIOGEL SZ 7.5 (GLOVE) ×1
GLOVE SURG SS PI 6.5 STRL IVOR (GLOVE) ×2 IMPLANT
GOWN STRL NON-REIN LRG LVL3 (GOWN DISPOSABLE) ×8 IMPLANT
KIT BASIN OR (CUSTOM PROCEDURE TRAY) ×2 IMPLANT
KIT ROOM TURNOVER OR (KITS) ×2 IMPLANT
NS IRRIG 1000ML POUR BTL (IV SOLUTION) ×2 IMPLANT
PACK GENERAL/GYN (CUSTOM PROCEDURE TRAY) ×2 IMPLANT
PAD ARMBOARD 7.5X6 YLW CONV (MISCELLANEOUS) ×4 IMPLANT
PADDING CAST COTTON 6X4 STRL (CAST SUPPLIES) IMPLANT
SAW GIGLI STERILE 20 (MISCELLANEOUS) ×2 IMPLANT
SPONGE GAUZE 4X4 12PLY (GAUZE/BANDAGES/DRESSINGS) ×2 IMPLANT
STAPLER VISISTAT 35W (STAPLE) ×2 IMPLANT
STOCKINETTE IMPERVIOUS LG (DRAPES) ×2 IMPLANT
SUT ETHILON 3 0 PS 1 (SUTURE) IMPLANT
SUT VIC AB 0 CT1 18XCR BRD 8 (SUTURE) ×2 IMPLANT
SUT VIC AB 0 CT1 8-18 (SUTURE) ×2
SUT VICRYL AB 2 0 TIES (SUTURE) ×2 IMPLANT
TAPE CLOTH SURG 4X10 WHT LF (GAUZE/BANDAGES/DRESSINGS) IMPLANT
TOWEL OR 17X24 6PK STRL BLUE (TOWEL DISPOSABLE) ×2 IMPLANT
TOWEL OR 17X26 10 PK STRL BLUE (TOWEL DISPOSABLE) ×2 IMPLANT
UNDERPAD 30X30 INCONTINENT (UNDERPADS AND DIAPERS) ×2 IMPLANT
WATER STERILE IRR 1000ML POUR (IV SOLUTION) IMPLANT

## 2012-06-24 NOTE — Anesthesia Preprocedure Evaluation (Addendum)
Anesthesia Evaluation  Patient identified by MRN, date of birth, ID band Patient awake    Reviewed: Allergy & Precautions, H&P , NPO status , Patient's Chart, lab work & pertinent test results  History of Anesthesia Complications Negative for: history of anesthetic complications  Airway Mallampati: II TM Distance: >3 FB Neck ROM: Full    Dental  (+) Poor Dentition and Dental Advisory Given   Pulmonary sleep apnea ,    Pulmonary exam normal       Cardiovascular hypertension, Pt. on medications and Pt. on home beta blockers + CAD and + Peripheral Vascular Disease + dysrhythmias Atrial Fibrillation + Valvular Problems/Murmurs AS Rhythm:Irregular Rate:Normal + Systolic murmurs    Neuro/Psych    GI/Hepatic Neg liver ROS, GERD-  Medicated,  Endo/Other  negative endocrine ROS  Renal/GU ESRF and DialysisRenal disease     Musculoskeletal   Abdominal   Peds  Hematology negative hematology ROS (+)   Anesthesia Other Findings   Reproductive/Obstetrics                        Anesthesia Physical Anesthesia Plan  ASA: III  Anesthesia Plan: General   Post-op Pain Management:    Induction: Intravenous  Airway Management Planned: LMA  Additional Equipment:   Intra-op Plan:   Post-operative Plan: Extubation in OR  Informed Consent: I have reviewed the patients History and Physical, chart, labs and discussed the procedure including the risks, benefits and alternatives for the proposed anesthesia with the patient or authorized representative who has indicated his/her understanding and acceptance.   Dental advisory given  Plan Discussed with: CRNA, Anesthesiologist and Surgeon  Anesthesia Plan Comments:         Anesthesia Quick Evaluation

## 2012-06-24 NOTE — Op Note (Signed)
OPERATIVE REPORT  DATE OF SURGERY: 06/24/2012  PATIENT: Gerald Tucker, 79 y.o. male MRN: [id number]  DOB: 1918/12/29  PRE-OPERATIVE DIAGNOSIS: Gangrene left foot  POST-OPERATIVE DIAGNOSIS:  Same  PROCEDURE: Left below-knee amputation  SURGEON:  Gretta Began, M.D.   ASSISTANT: Nurse  ANESTHESIA:  Enteral  EBL: Minimal ml  Total I/O In: 400 [I.V.:400] Out: 50 [Blood:50]  BLOOD ADMINISTERED: None  DRAINS: None  SPECIMEN: Left leg amputation  COUNTS CORRECT:  YES  PLAN OF CARE: PACU   PATIENT DISPOSITION:  PACU - hemodynamically stable  PROCEDURE DETAILS: The patient was taken to the upper and placed supine position where the area of the left leg and left calf were prepped and draped in sterile fashion. Using a posterior based muscle flap the incision was made several fingerbreadths below the tibial prominence on the anterior surface and the posterior base of musculocutaneous flap was left intact. The skin incision was carried through the fascia with electrocautery. The anterior tibial muscle bodies were divided in line with the skin incision. The anterior tibial and peroneal arteries were ligated with 0 Vicryl ties and divided. There is extensive calcification at this level. The gastrocnemius muscle was left intact with the posterior skin flap. The soleus muscle was resected in line with the anterior incision. The popliteal artery was ligated with 0 Vicryl ties and divided. The periosteum was elevated on the tibia and fibula. The fibula was divided with bone shears and the tibia was divided with the Gigli saw. The edges of the bone were smoothed with a bone rasp. The specimen was passed off the field. The wounds were irrigated with saline. Hemostasis was obtained with electrocautery. The wounds were closed with 0 Vicryl figure-of-eight sutures to reapproximate the anterior fascia to the posterior fascia. The skin was closed with skin staples. A sterile dressing was applied and the  patient was taken to the recovery room in stable condition   Gretta Began, M.D. 06/24/2012 12:24 PM

## 2012-06-24 NOTE — Interval H&P Note (Signed)
History and Physical Interval Note:  06/24/2012 9:41 AM  [name]  has presented today for surgery, with the diagnosis of Osteomyelitis left foot Nonhealing left great toe amp site  The various methods of treatment have been discussed with the patient and family. After consideration of risks, benefits and other options for treatment, the patient has consented to  Procedure(s) with comments: AMPUTATION BELOW KNEE (Left) - Left Below Knee  vs. Above Knee Amputation as a surgical intervention .  The patient's history has been reviewed, patient examined, no change in status, stable for surgery.  I have reviewed the patient's chart and labs.  Questions were answered to the patient's satisfaction.     Gerald Tucker

## 2012-06-24 NOTE — Consult Note (Signed)
Physical Medicine and Rehabilitation Consult  Reason for Consult: L-BKA for gangrenous changes left foot.  Referring Physician: Dr. Arbie Cookey.   HPI: Gerald Tucker is a 79 y.o. male with history of ESRD, CAD, PVD with severe unreconstructable tibial disease and  gangrenous changes of left fourth and great toe underwent toe amputation with poor healing and worsening of infection. Admitted on 06/21/12 with foot pain, progressive gangrene. Placed on IV antibiotic as patient/family hesitant BKA.  Wound with feculent drainage and patient with continued complaints of pain. On 04/04 patient underwent L-BKA by Dr. Arbie Cookey.     @ROS @ Past Medical History  Diagnosis Date  . Hypertension   . Ulcer   . GERD (gastroesophageal reflux disease)   . CAD (coronary artery disease)     distal LAD occlusion  . Poor circulation   . ESRD on dialysis   . Hyperlipidemia   . Cancer     Skin  . Knee pain, right   . OSA (obstructive sleep apnea)   . PAD (peripheral artery disease)   . IDA (iron deficiency anemia)   . Hyperparathyroidism   . Arthritis   . Atrial fibrillation   . Aortic stenosis    Past Surgical History  Procedure Laterality Date  . Back surgery    . Neck surgery    . Hernia repair      right  . Knee surgery    . Leg surgery    . Shoulder surgery    . Cardiac catheterization  02/06/2005    EF 65-70%.  . Av fistula placement      LEFT ARM  . Cataract extraction, bilateral    . Cholecystectomy    . Transthoracic echocardiogram  04/04/2009    EF 60-65%  . Transthoracic echocardiogram  02/07/2008    EF 55-60%  . Cardiovascular stress test  06/01/2006    EF 55%. NORMAL LV FUNCTION   Family History  Problem Relation Age of Onset  . Stroke Mother   . Heart attack Father   . Stroke Brother   . Stroke Brother   . Stroke Brother    Social History:  reports that he has never smoked. He has never used smokeless tobacco. He reports that he does not drink alcohol or use illicit  drugs. Allergies:  Allergies  Allergen Reactions  . Penicillins Rash    "felt like on fire"  . Morphine And Related     Agitated and sees things  . Amoxicillin     Pt reported unknown reaction  . Darvocet (Propoxyphene-Acetaminophen)     Agitated and sees things  . Keflex (Cephalexin) Other (See Comments)    Pt reported unknown reaction  . Neurontin (Gabapentin)     Agitated and confused  . Ultram (Tramadol Hcl)     Agitated and sees things. Can take small amounts  . Vancomycin Itching   Medications Prior to Admission  Medication Sig Dispense Refill  . acetaminophen (TYLENOL) 325 MG tablet Take 650 mg by mouth every 6 (six) hours as needed. For pain      . aspirin 81 MG tablet Take 81 mg by mouth daily.        . calcium acetate (PHOSLO) 667 MG capsule Take 667 mg by mouth 3 (three) times daily with meals.       . clindamycin (CLEOCIN) 150 MG capsule Take 150 mg by mouth 2 (two) times daily.       . isosorbide mononitrate (IMDUR) 60 MG 24 hr tablet Take  60 mg by mouth daily.        Marland Kitchen LIDODERM 5 % Place 1 patch onto the skin daily. One patch once or twice a week      . metoprolol (LOPRESSOR) 50 MG tablet Take 50 mg by mouth 2 (two) times daily.       . nitroGLYCERIN (NITROSTAT) 0.4 MG SL tablet Place 1 tablet (0.4 mg total) under the tongue every 5 (five) minutes as needed.  25 tablet  11  . nystatin ointment (MYCOSTATIN) Apply 1 application topically 2 (two) times daily.      Marland Kitchen omeprazole (PRILOSEC) 20 MG capsule Take 20 mg by mouth 2 (two) times daily.        . polyethylene glycol (MIRALAX / GLYCOLAX) packet Take 17 g by mouth daily.  10 each  0  . simvastatin (ZOCOR) 20 MG tablet Take 20 mg by mouth at bedtime.        . traMADol (ULTRAM) 50 MG tablet Take 50 mg by mouth 2 (two) times daily. For pain        Home:    Functional History:   Functional Status:  Mobility:          ADL:    Cognition: Cognition Orientation Level: Oriented to person;Oriented to  place;Oriented to situation    Blood pressure 122/72, pulse 112, temperature 98.4 F (36.9 C), temperature source Oral, resp. rate 19, height 5' 10.08" (1.78 m), weight 69.1 kg (152 lb 5.4 oz), SpO2 96.00%. @PHYSEXAMBYAGE2 @  No results found for this or any previous visit (from the past 24 hour(s)). Dg Foot Complete Right  06/23/2012  *RADIOLOGY REPORT*  Clinical Data: Possible osteomyelitis.  RIGHT FOOT COMPLETE - 3+ VIEW  Comparison: February 16, 2012.  Findings: No fracture or dislocation is noted.  Arterial calcifications are noted suggesting diabetes.  Mild spurring of posterior calcaneus is noted.  No lytic destruction is seen to suggest osteomyelitis.  Mild degenerative changes seen involving the first interphalangeal joint as well as the fifth metatarsophalangeal joint.  IMPRESSION: No lytic destruction is seen to suggest osteomyelitis.   Original Report Authenticated By: Lupita Raider.,  M.D.     Assessment/Plan: Diagnosis: left BKA Comment: Pt had surgery today. 79 yo marginally functioning PTA. Will follow for functional progress, but most realistically will probably need SNF.  Ranelle Oyster, MD, [place] [facility]     06/24/2012

## 2012-06-24 NOTE — Procedures (Signed)
Pt seen on HD.  AP 100  Vp 220 at BFR 300.  Will slowly increase BFR to 400.  DC IV fluids.

## 2012-06-24 NOTE — Preoperative (Signed)
Beta Blockers   Reason not to administer Beta Blockers:Not Applicable 

## 2012-06-24 NOTE — Progress Notes (Signed)
Internal Medicine Teaching Service Attending Note Date: 06/24/2012  Patient name: Gerald Tucker  Medical record number: [id number]  Date of birth: 15-Jun-1918    This patient has been seen and discussed with the house staff. Please see their note for complete details. I concur with their findings with the following additions/corrections: Patient's confusion has completely disappeared. He is much more conversant today. Patient was about to be taken to operating room for BKA. He is in good spirits. I updated the family on the plan and answered their questions. We will continue to follow up the patient. No changes in medications today.  Lars Mage 06/24/2012, 12:02 PM

## 2012-06-24 NOTE — Progress Notes (Signed)
Subjective:  [name] had worsening leg pain overnight. He is alert this morning and able to answer questions. Not as agitated. He states he is ready for the surgery. Family present.  Objective: Vital signs in last 24 hours: Filed Vitals:   06/23/12 1348 06/23/12 1751 06/23/12 2203 06/24/12 0513  BP: 125/38 128/68 118/52 117/70  Pulse: 97 88 99 108  Temp: 98.7 F (37.1 C) 98.7 F (37.1 C) 99.5 F (37.5 C) 98.3 F (36.8 C)  TempSrc: Oral Axillary Oral Oral  Resp: 18 17 18 16   Height:      Weight:   152 lb 5.4 oz (69.1 kg)   SpO2: 98% 98% 98% 92%   Weight change: -2 lb 6.8 oz (-1.1 kg)  Intake/Output Summary (Last 24 hours) at 06/24/12 0949 Last data filed at 06/24/12 0600  Gross per 24 hour  Intake    360 ml  Output      0 ml  Net    360 ml    Physical Exam Blood pressure 117/70, pulse 108, temperature 98.3 F (36.8 C), temperature source Oral, resp. rate 16, height 5' 10.08" (1.78 m), weight 152 lb 5.4 oz (69.1 kg), SpO2 92.00%. General: No acute distress, alert and oriented x 3, appears stated age  HEENT: PERRL, EOMI, moist mucous membranes  Cardiovascular: Irregular rhythm, rate in the 90s, soft blowing systolic murmur  Respiratory: Clear to auscultation bilaterally, no wheezes, rales, or rhonchi  Abdomen: Soft, nondistended, nontender, bowel sounds present  Extremities: Left upper extremity with aVF. Left lower extremity cool, erythematous to dorsum, decreased swelling, wound bed with purulent drainage and surrounding necrosis. Foul odor noted. RLE with stable dry gangrene over R 4th toe and blister over R great toe.  Neuro: Not anxious appearing, no depressed mood, normal affect.    Lab Results: Basic Metabolic Panel:  Recent Labs Lab 06/22/12 0550 06/23/12 1047  NA 137 139  K 4.1 3.6  CL 99 100  CO2 24 26  GLUCOSE 104* 137*  BUN 50* 30*  CREATININE 7.10* 4.81*  CALCIUM 8.9 8.8  PHOS 4.3  --    Liver Function Tests:  Recent Labs Lab 06/22/12 0550   ALBUMIN 2.1*   CBC:  Recent Labs Lab 06/21/12 1329 06/22/12 0550 06/23/12 1047  WBC 17.9* 17.1* 21.8*  NEUTROABS 15.6*  --   --   HGB 9.4* 9.1* 8.9*  HCT 28.5* 27.2* 26.6*  MCV 97.9 97.5 98.2  PLT 382 356 317   Studies/Results: Dg Foot Complete Right  06/23/2012  *RADIOLOGY REPORT*  Clinical Data: Possible osteomyelitis.  RIGHT FOOT COMPLETE - 3+ VIEW  Comparison: February 16, 2012.  Findings: No fracture or dislocation is noted.  Arterial calcifications are noted suggesting diabetes.  Mild spurring of posterior calcaneus is noted.  No lytic destruction is seen to suggest osteomyelitis.  Mild degenerative changes seen involving the first interphalangeal joint as well as the fifth metatarsophalangeal joint.  IMPRESSION: No lytic destruction is seen to suggest osteomyelitis.   Original Report Authenticated By: Lupita Raider.,  M.D.    Medications:  Medications reviewed  Scheduled Meds: . alteplase  4 mg Intracatheter Once  . aspirin  81 mg Oral Daily  . calcium acetate  667 mg Oral TID WC  . cefTAZidime (FORTAZ)  IV  2 g Intravenous Q M,W,F-2000  . darbepoetin (ARANESP) injection - DIALYSIS  40 mcg Intravenous Q Wed-HD  . enoxaparin (LOVENOX) injection  30 mg Subcutaneous QHS  . feeding supplement (NEPRO CARB STEADY)  237 mL Oral BID BM  . ferric gluconate (FERRLECIT/NULECIT) IV  125 mg Intravenous Q M,W,F-HD  . haloperidol lactate  0.5 mg Intravenous Once  . metoprolol  50 mg Oral BID  . multivitamin  1 tablet Oral QHS  . pantoprazole  40 mg Oral Q1200  . polyethylene glycol  17 g Oral Daily  . senna  1 tablet Oral BID  . simvastatin  20 mg Oral QHS  . vancomycin  750 mg Intravenous Q M,W,F-HD   Continuous Infusions: . sodium chloride 50 mL/hr at 06/24/12 0932  . lactated ringers     PRN Meds:.acetaminophen, diphenhydrAMINE, fentaNYL  Assessment/Plan:  Mr. Strahle is a 79 year old gentleman with a history of ESRD on HD (MWF), atrial fibrillation, and peripheral  vascular disease with recent amputation of gangrenous left great toe one week ago. He presents directly from his surgeon's office where he was seen for a followup visit and directed to come to the ED for evaluation of pain, swelling and purulent discharge from his left foot wound.   PVD with Nonhealing left foot wound  In setting of severe PVD, s/p L great toe amputation 1 week ago.  Ankle arm index on 06/07/12 was 0.58 on the right and 0.56 on the left with monophasic waveforms. Arteriography from December 2010 showed severe unreconstructable tibial vessel disease.  4/3: Family agreeable to surgery. Plain films of R foot without evidence of osteo. Agree that MRI is more sensitive and may be considered after BKA. 4/4: White count up to 22 and increased pain. Patient to OR for BKA vs AKA  -perioperative Vanc and Ceftazidime -appreciate ID input -Fentanyl PRN for pain   Agitation and Delirium, improved today Patient with known sensitivities to narcotics and prone to hospital delirium, not new per family. Current infection can make this worse. -prudence with pain medications, balancing pain control with side effects -call MD if needed for PRN haldol  ESRD on HD (MWF)   -cont HD per renal -Continue PhosLo  -follow renal function panel  Atrial fibrillation without RVR  In Afib without RVR on admission. Patient is only on aspirin at home. Chads 2 score of 2.  Risk for RVR increased given current infection. Cont to monitor.  -Continue aspirin, metoprolol   Chronic back pain Patient with worsening back pain due to the uncomfortable and small hospital bed. He has struggled for >20 years with chronic back pain. We discussed using his PRN pain medications and reminded him that he needs to ask his nurses for pain medications. Offered extra pillows. -continue PRN fentanyl  Constipation  -cont MiraLax and senna  -encourage oral hydration  Hypertension  Blood pressure was 83/37 initially on  presentation. 117/70 this morning -Hold home imdur in setting of low blood pressure -Continue metoprolol   DVT  -Lovenox, renally dosed  FEN  -Normal saline lock  -Renal diet   Dispo  Anticipate dc in 2-4 days.  Patient does have a PCP and will not be needing OPC f/u    LOS: 3 days   [place] Ar 06/24/2012, 9:49 AM

## 2012-06-24 NOTE — Anesthesia Procedure Notes (Addendum)
Procedure Name: LMA Insertion Date/Time: 06/24/2012 10:05 AM Performed by: Rogelia Boga Pre-anesthesia Checklist: Patient identified, Emergency Drugs available, Suction available, Patient being monitored and Timeout performed Patient Re-evaluated:Patient Re-evaluated prior to inductionOxygen Delivery Method: Circle system utilized Preoxygenation: Pre-oxygenation with 100% oxygen Intubation Type: IV induction LMA: LMA inserted LMA Size: 4.0 Number of attempts: 1 Placement Confirmation: positive ETCO2 and breath sounds checked- equal and bilateral Tube secured with: Tape Dental Injury: Teeth and Oropharynx as per pre-operative assessment

## 2012-06-24 NOTE — Anesthesia Postprocedure Evaluation (Signed)
Anesthesia Post Note  Patient: Gerald Tucker  Procedure(s) Performed: Procedure(s) (LRB): LEFT BELOW KNEE AMPUTATION (Left)  Anesthesia type: general  Patient location: PACU  Post pain: Pain level controlled  Post assessment: Patient's Cardiovascular Status Stable  Last Vitals:  Filed Vitals:   06/24/12 1200  BP: 109/63  Pulse: 65  Temp:   Resp: 18    Post vital signs: Reviewed and stable  Level of consciousness: sedated  Complications: No apparent anesthesia complications

## 2012-06-24 NOTE — H&P (View-Only) (Signed)
[facility] [place] [facility]  Progress note Subjective Gerald Tucker is a 79 y.o. male who has known severe PVD. Pt had a great toe amp on the left which is not healing. Left foot x-ray shows osteomyelitis. He also c/o right knee pain after several falls at home - no FX or osteo per x-ray.  Pt had been hanging feet over bed at night to relieve pain.  He denies calf pain but has more severe left foot pain. He also has right foot pain with dry gangrenous changes in right great toe and 4th toe  Significant Diagnostic Studies: CBC Lab Results  Component Value Date   WBC 17.1* 06/22/2012   HGB 9.1* 06/22/2012   HCT 27.2* 06/22/2012   MCV 97.5 06/22/2012   PLT 356 06/22/2012    BMET    Component Value Date/Time   NA 137 06/22/2012 0550   K 4.1 06/22/2012 0550   CL 99 06/22/2012 0550   CO2 24 06/22/2012 0550   GLUCOSE 104* 06/22/2012 0550   BUN 50* 06/22/2012 0550   CREATININE 7.10* 06/22/2012 0550   CALCIUM 8.9 06/22/2012 0550   CALCIUM 9.1 04/01/2009 2202   GFRNONAA 6* 06/22/2012 0550   GFRAA 7* 06/22/2012 0550     Intake/Output Summary (Last 24 hours) at 06/23/12 1012 Last data filed at 06/23/12 4782  Gross per 24 hour  Intake    150 ml  Output   2000 ml  Net  -1850 ml   Patient Vitals for the past 24 hrs:  Urine Occurrence  06/22/12 1445 1     Physical Examination  BP Readings from Last 3 Encounters:  06/23/12 114/53  06/07/12 148/70  05/17/12 94/48   Temp Readings from Last 3 Encounters:  06/23/12 99.5 F (37.5 C) Oral  05/08/12 98.7 F (37.1 C) Oral  01/04/12 98 F (36.7 C) Oral   SpO2 Readings from Last 3 Encounters:  06/23/12 96%  05/17/12 88%  05/08/12 95%   Pulse Readings from Last 3 Encounters:  06/23/12 76  06/07/12 96  05/17/12 83    Pt is A&Ox3  WDWN male with C/O severe pain in right foot  Right great toe amputation wound is necrotic with foul smelling tissue at base. The calf is soft and warm, non tender  Assessment/plan:  Gerald Tucker is a 79  y.o. male who is s/p Right great toe amp which is necrotic with osteomyelitis in foot. Pt having severe pain in the foot and is ready to proceed with amputation Pt had HD late last night Finished eating at 8:30am today Pt will need Left BKA vs AKA - surgeon to assess  Orell Hurtado J 10:12 AM 06/23/[id number]  I agree with the above. I spoke with the patient's son who is at the bedside. The plan will be for a below knee amputation. I did discuss that if the tissue was clearly not going to heal a below knee amputation, that we would proceed with an above-knee amputation the same setting. The family is on board with this plan as the patient is having severe pain in his foot.  Durene Cal

## 2012-06-24 NOTE — Transfer of Care (Signed)
Immediate Anesthesia Transfer of Care Note  Patient: Gerald Tucker  Procedure(s) Performed: Procedure(s): LEFT BELOW KNEE AMPUTATION (Left)  Patient Location: PACU  Anesthesia Type:General  Level of Consciousness: awake, alert , oriented and patient cooperative  Airway & Oxygen Therapy: Patient Spontanous Breathing and Patient connected to nasal cannula oxygen  Post-op Assessment: Report given to PACU RN, Post -op Vital signs reviewed and stable and Patient moving all extremities  Post vital signs: Reviewed and stable  Complications: No apparent anesthesia complications

## 2012-06-25 LAB — BASIC METABOLIC PANEL
CO2: 28 mEq/L (ref 19–32)
Chloride: 101 mEq/L (ref 96–112)
GFR calc non Af Amer: 13 mL/min — ABNORMAL LOW (ref >90)
Glucose, Bld: 86 mg/dL (ref 70–99)
Potassium: 3.8 mEq/L (ref 3.5–5.1)
Sodium: 140 mEq/L (ref 135–145)

## 2012-06-25 LAB — CBC
HCT: 28.3 % — ABNORMAL LOW (ref 39.0–52.0)
Hemoglobin: 9.5 g/dL — ABNORMAL LOW (ref 13.0–17.0)
MCH: 33.1 pg (ref 26.0–34.0)
MCV: 98.6 fL (ref 78.0–100.0)
RBC: 2.87 MIL/uL — ABNORMAL LOW (ref 4.22–5.81)

## 2012-06-25 NOTE — Progress Notes (Addendum)
[facility] Progress Note  06/25/2012 10:13 AM POD 1  Subjective:  Up in chair eating breakfast-no complaints; appears slightly confused. Son at bedside  Tm 99 now afebrile VSS   Filed Vitals:   06/25/12 0640  BP: 136/66  Pulse: 110  Temp: 98.7 F (37.1 C)  Resp: 18    Physical Exam: Incisions:  Bandage is in tact and is clean. Extremities:  Right 4th toe with some dry, gangrenous changes  CBC    Component Value Date/Time   WBC 17.1* 06/25/2012 0530   RBC 2.87* 06/25/2012 0530   HGB 9.5* 06/25/2012 0530   HCT 28.3* 06/25/2012 0530   PLT 190 06/25/2012 0530   MCV 98.6 06/25/2012 0530   MCH 33.1 06/25/2012 0530   MCHC 33.6 06/25/2012 0530   RDW 14.9 06/25/2012 0530   LYMPHSABS 0.9 06/21/2012 1329   MONOABS 1.4* 06/21/2012 1329   EOSABS 0.1 06/21/2012 1329   BASOSABS 0.0 06/21/2012 1329    BMET    Component Value Date/Time   NA 140 06/25/2012 0530   K 3.8 06/25/2012 0530   CL 101 06/25/2012 0530   CO2 28 06/25/2012 0530   GLUCOSE 86 06/25/2012 0530   BUN 21 06/25/2012 0530   CREATININE 3.80* 06/25/2012 0530   CALCIUM 8.7 06/25/2012 0530   CALCIUM 9.1 04/01/2009 2202   GFRNONAA 13* 06/25/2012 0530   GFRAA 15* 06/25/2012 0530    INR    Component Value Date/Time   INR 1.10 08/22/2010 1820     Intake/Output Summary (Last 24 hours) at 06/25/12 1013 Last data filed at 06/24/12 2054  Gross per 24 hour  Intake    640 ml  Output   1750 ml  Net  -1110 ml     Assessment/Plan:  79 y.o. male is s/p left below knee amputation  POD 1  -continue to mobilize -will take down dressing tomorrow and evaluate wound. -HD per renal -continue to encourage po intake   Doreatha Massed, PA-C [facility] [phone] 06/25/2012 10:13 AM           I have examined the patient, reviewed and agree with above. Comfortable. Minimal incisional soreness. We'll check wound and a.m. EARLY, TODD, MD 06/25/2012 12:37 PM

## 2012-06-25 NOTE — Progress Notes (Signed)
[facility] Progress Note  Subjective:   Feels ok. Denies pain. Slept a little better last night, per son Zollie Beckers at bedside.  Objective Filed Vitals:   06/24/12 1730 06/24/12 1751 06/24/12 2052 06/25/12 0640  BP: 114/60 121/71 125/60 136/66  Pulse: 107 114 106 110  Temp:  97.9 F (36.6 C) 99 F (37.2 C) 98.7 F (37.1 C)  TempSrc:  Oral Oral Oral  Resp:  16 16 18   Height:   5\' 8"  (1.727 m)   Weight:   68.601 kg (151 lb 3.8 oz)   SpO2: 90% 91% 100% 96%   Physical Exam General: Elderly, pleasant, NAD Heart: RRR Lungs: CTA bilaterally. No wheezed, rales, rhonchi Abdomen: Soft, NT, normal BS Extremities: No LE edema. L BKA wrapped. R foot with eschar to 4th toe. Dialysis Access:  Lt IJ permcath  Dialysis Orders: Center: [place] on MWF .  EDW 72 kg HD Bath 2K/ 2Ca Time 3:45 Heparin 3000u bolus/ 2000 u mid. Access Left IJ BFR 400 DFR A1,5  Hectorol 0 mcg IV/HD Epogen 1800 Units IV/HD Venofer 100 mg IV through 07/01/12    Assessment/Plan: 1. Post operative infection/ Non-healing wound to left foot - S/p amputation of left great toe resulting in 6x2x3 cm draining wound with minimal osteomyelitis. Left BKA on 4/4. On Vanc/Fortaz - per ID, pt able to tolerated these despite many medication allergies. Will likely need SNF per PT. 2. Right foot eschar to 4th digit - No osteo on imaging. 3. Delirium - Improving 4. ESRD - MWF schedule. K+ 3.8 5. Hypertension/volume - SBPs 110s - 130s. Metoprolol 50 BID, holding a.m.dose on HD days. . Net UF 1.7 L on Friday. Will need to establish new EDW at discharge. 6. Anemia - Hgb 9.5. Aranesp 40 q Wed HD. IV Fe load through 4/11.  7. Metabolic bone disease - Ca 8.7 (10.3 corrected) Continue low Ca bath. P 4.3. PTH 273.9, no opVit D. Phoslo for binder. 8. Nutrition - Albumin 1.9.  Non-healing wound likely contributory. Renal diet, multivitamin, nepro.   Scot Jun. Thad Ranger [facility] Pager [phone] 06/25/2012,9:27 AM   LOS: 4 days   I have seen and examined this patient and agree with plan per Jodene Nam.  Pain controlled.  Diet being advanced.  Wants something for BM. Norvin Ohlin T,MD 06/25/2012 9:51 AM Additional Objective Labs: Basic Metabolic Panel:  Recent Labs Lab 06/22/12 0550 06/23/12 1047 06/24/12 1416 06/25/12 0530  NA 137 139 141  142 140  K 4.1 3.6 4.3  4.3 3.8  CL 99 100 102  103 101  CO2 24 26 24  23 28   GLUCOSE 104* 137* 73  73 86  BUN 50* 30* 46*  46* 21  CREATININE 7.10* 4.81* 6.51*  6.42* 3.80*  CALCIUM 8.9 8.8 8.8  8.7 8.7  PHOS 4.3  --  4.8*  --    Liver Function Tests:  Recent Labs Lab 06/22/12 0550 06/24/12 1416  ALBUMIN 2.1* 1.9*   No results found for this basename: LIPASE, AMYLASE,  in the last 168 hours CBC:  Recent Labs Lab 06/21/12 1329 06/22/12 0550 06/23/12 1047 06/24/12 1416 06/25/12 0530  WBC 17.9* 17.1* 21.8* 15.6* 17.1*  NEUTROABS 15.6*  --   --   --   --   HGB 9.4* 9.1* 8.9* 8.9* 9.5*  HCT 28.5* 27.2* 26.6* 26.3* 28.3*  MCV 97.9 97.5 98.2 98.9 98.6  PLT 382 356 317 143* 190   Blood Culture    Component Value  Date/Time   SDES URINE, CLEAN CATCH 01/01/2012 1623   SPECREQUEST NONE 01/01/2012 1623   CULT Multiple bacterial morphotypes present, none predominant. Suggest appropriate recollection if clinically indicated. 01/01/2012 1623   REPTSTATUS 01/03/2012 FINAL 01/01/2012 1623    Studies/Results: No results found. Medications:   . alteplase  4 mg Intracatheter Once  . aspirin  81 mg Oral Daily  . calcium acetate  667 mg Oral TID WC  . cefTAZidime (FORTAZ)  IV  2 g Intravenous Q M,W,F-2000  . darbepoetin (ARANESP) injection - DIALYSIS  40 mcg Intravenous Q Wed-HD  . enoxaparin (LOVENOX) injection  30 mg Subcutaneous QHS  . feeding supplement (NEPRO CARB STEADY)  237 mL Oral BID BM  . ferric gluconate (FERRLECIT/NULECIT) IV  125 mg Intravenous Q M,W,F-HD  . metoprolol  50 mg Oral BID  . multivitamin  1 tablet Oral  QHS  . pantoprazole  40 mg Oral Q1200  . polyethylene glycol  17 g Oral Daily  . senna  1 tablet Oral BID  . simvastatin  20 mg Oral QHS  . sodium chloride  3 mL Intravenous Q12H  . vancomycin  750 mg Intravenous Q M,W,F-HD

## 2012-06-25 NOTE — Progress Notes (Signed)
Subjective: The patient had left BKA yesterday. He states that he is doing okay although they have not brought him anything to eat yet this morning. He is not having any pain in that leg right now. [facility]. Spoke with a son who was in the room. Advised them that he will likely need to go somewhere for rehab while this is healing. Advised that likely this would be Monday or Tuesday. He is not having any pain today and no chest pain. No abdominal pain. He is not having any nausea/vomiting/diarrhea. He does not have any complaints or needs at this time.   Objective: Vital signs in last 24 hours: Filed Vitals:   06/24/12 1730 06/24/12 1751 06/24/12 2052 06/25/12 0640  BP: 114/60 121/71 125/60 136/66  Pulse: 107 114 106 110  Temp:  97.9 F (36.6 C) 99 F (37.2 C) 98.7 F (37.1 C)  TempSrc:  Oral Oral Oral  Resp:  16 16 18   Height:   5\' 8"  (1.727 m)   Weight:   151 lb 3.8 oz (68.601 kg)   SpO2: 90% 91% 100% 96%   Weight change: -1 lb 1.6 oz (-0.5 kg)  Intake/Output Summary (Last 24 hours) at 06/25/12 0905 Last data filed at 06/24/12 2054  Gross per 24 hour  Intake    640 ml  Output   1750 ml  Net  -1110 ml    Physical Exam Blood pressure 136/66, pulse 110, temperature 98.7 F (37.1 C), temperature source Oral, resp. rate 18, height 5\' 8"  (1.727 m), weight 151 lb 3.8 oz (68.601 kg), SpO2 96.00%. General: No acute distress, alert and oriented x 3, appears stated age  HEENT: PERRL, EOMI, moist mucous membranes  Cardiovascular: Irregular rhythm, soft blowing systolic murmur  Respiratory: Clear to auscultation bilaterally, no wheezes, rales, or rhonchi  Abdomen: Soft, nondistended, nontender, bowel sounds present  Extremities: Left upper extremity with aVF. Left lower extremity with new BKA and stump wrapped. RLE with stable dry gangrene over R 4th toe and blister over R great toe.  Neuro: Not anxious appearing, no depressed mood, normal  affect.    Lab Results: Basic Metabolic Panel:  Recent Labs Lab 06/22/12 0550  06/24/12 1416 06/25/12 0530  NA 137  < > 141  142 140  K 4.1  < > 4.3  4.3 3.8  CL 99  < > 102  103 101  CO2 24  < > 24  23 28   GLUCOSE 104*  < > 73  73 86  BUN 50*  < > 46*  46* 21  CREATININE 7.10*  < > 6.51*  6.42* 3.80*  CALCIUM 8.9  < > 8.8  8.7 8.7  PHOS 4.3  --  4.8*  --   < > = values in this interval not displayed. Liver Function Tests:  Recent Labs Lab 06/22/12 0550 06/24/12 1416  ALBUMIN 2.1* 1.9*   CBC:  Recent Labs Lab 06/21/12 1329  06/24/12 1416 06/25/12 0530  WBC 17.9*  < > 15.6* 17.1*  NEUTROABS 15.6*  --   --   --   HGB 9.4*  < > 8.9* 9.5*  HCT 28.5*  < > 26.3* 28.3*  MCV 97.9  < > 98.9 98.6  PLT 382  < > 143* 190  < > = values in this interval not displayed. Studies/Results: No results found. Medications:  Medications reviewed  Scheduled Meds: . alteplase  4 mg Intracatheter Once  .  aspirin  81 mg Oral Daily  . calcium acetate  667 mg Oral TID WC  . cefTAZidime (FORTAZ)  IV  2 g Intravenous Q M,W,F-2000  . darbepoetin (ARANESP) injection - DIALYSIS  40 mcg Intravenous Q Wed-HD  . enoxaparin (LOVENOX) injection  30 mg Subcutaneous QHS  . feeding supplement (NEPRO CARB STEADY)  237 mL Oral BID BM  . ferric gluconate (FERRLECIT/NULECIT) IV  125 mg Intravenous Q M,W,F-HD  . metoprolol  50 mg Oral BID  . multivitamin  1 tablet Oral QHS  . pantoprazole  40 mg Oral Q1200  . polyethylene glycol  17 g Oral Daily  . senna  1 tablet Oral BID  . simvastatin  20 mg Oral QHS  . sodium chloride  3 mL Intravenous Q12H  . vancomycin  750 mg Intravenous Q M,W,F-HD   Continuous Infusions:   PRN Meds:.sodium chloride, acetaminophen, diphenhydrAMINE, fentaNYL, ondansetron, sodium chloride  Assessment/Plan:  Gerald Tucker is a 79 year old gentleman with a history of ESRD on HD (MWF), atrial fibrillation, and peripheral vascular disease with recent amputation of  gangrenous left great toe one week ago. He presents directly from his surgeon's office where he was seen for a followup visit and directed to come to the ED for evaluation of pain, swelling and purulent discharge from his left foot wound.   PVD with Nonhealing left foot wound  S/P L BKA POD 1. Advancing diet as tolerated and stopping IVF given ESRD and tolerating fluids. Doing well with pain and no phantom pains at this time. PMR doctor came to evaluate and likely thinks SNF would be better place for rehabillatation. Still on IV antibiotics for 48 hours post op unless surgery thinks he needs them longer. -Fentanyl prn for pain -Vascular surgery on board for post op management -ID on board and appreciate recs for IV for 48 hours post-operatively  Dry gangrene on R foot - Did have x-ray without obvious osteomyelitis however may need MRI as out-patient to definitively rule out.  Agitation and Delirium, improved today Patient with known sensitivities to narcotics and prone to hospital delirium, not new per family. Current infection can make this worse. -prudence with pain medications, balancing pain control with side effects -call MD if needed for PRN haldol  ESRD on HD (MWF)   -cont HD per renal -Continue PhosLo  -follow renal function panel  Atrial fibrillation without RVR  In Afib without RVR on admission. Patient is only on aspirin at home. Chads 2 score of 2.  Risk for RVR increased given current infection. Cont to monitor.  -Continue aspirin, metoprolol   Chronic back pain Patient with worsening back pain due to the uncomfortable and small hospital bed. He has struggled for >20 years with chronic back pain. We discussed using his PRN pain medications and reminded him that he needs to ask his nurses for pain medications. Offered extra pillows. -continue PRN fentanyl  Constipation  -cont MiraLax and senna  -encourage oral hydration  Hypertension  136/66 this morning -Hold home  imdur in setting of low blood pressure on admission -Continue metoprolol   DVT  -Lovenox, renally dosed  FEN  -Normal saline lock  -Renal diet   Dispo  Anticipate d/c in 2-4 days to SNF.  Patient does have a PCP and will not be needing OPC f/u    LOS: 4 days   Gerald Tucker, Gerald Tucker 06/25/2012, 9:05 AM

## 2012-06-25 NOTE — Evaluation (Addendum)
Physical Therapy Evaluation Patient Details Name: Gerald Tucker MRN: [id number] DOB: 1918-10-06 Today's Date: 06/25/2012 Time: 5621-3086 PT Time Calculation (min): 52 min  PT Assessment / Plan / Recommendation Clinical Impression  Pt s/p LBKA with decr mobility secondary to pain in left residual limb as well as weakness and poor endurance.  Will benefit from NHP at D/C as pt has no 24 hour caregiver. Spent increased time talking with patient and son explaining rehab process.  They both agree that NH is best option for patient.   MD:  Please order limb guard from prosthetist to prevent knee flexion contracture.  Had a lot of difficulty stretching knee to neutral position today.  Apllied heat which helped.  Thanks.    PT Assessment  Patient needs continued PT services    Follow Up Recommendations  SNF;Supervision/Assistance - 24 hour        Barriers to Discharge Decreased caregiver support      Equipment Recommendations  None recommended by PT         Frequency Min 3X/week    Precautions / Restrictions Precautions Precautions: Fall Restrictions Weight Bearing Restrictions: No   Pertinent Vitals/Pain VSS, Some pain      Mobility  Bed Mobility Bed Mobility: Rolling Right;Right Sidelying to Sit;Sitting - Scoot to [place] of Bed Rolling Right: 4: Min assist;With rail Right Sidelying to Sit: 3: Mod assist;With rails;HOB elevated Sitting - Scoot to Edge of Bed: 3: Mod assist Details for Bed Mobility Assistance: Needed assist for elevation on trunk Transfers Transfers: Sit to Stand;Stand to Sit;Squat Pivot Transfers Sit to Stand: 3: Mod assist;With upper extremity assist;From bed Stand to Sit: 3: Mod assist;With upper extremity assist;With armrests;To chair/3-in-1 Squat Pivot Transfers: 3: Mod assist;With upper extremity assistance Details for Transfer Assistance: Pt unable to achieve full upright stance even with right knee blocked.  Squat pivot transfer with mod assist blocking  left knee.  Pt needed incr cues to lean forward and to try and cue pt for hip and trunk extension without much success.   Ambulation/Gait Ambulation/Gait Assistance: Not tested (comment) Stairs: No Wheelchair Mobility Wheelchair Mobility: No    Exercises General Exercises - Lower Extremity Quad Sets: AAROM;Left;10 reps;Seated Long Arc Quad: AAROM;Left;10 reps;Seated   PT Diagnosis: Generalized weakness  PT Problem List: Decreased strength;Decreased activity tolerance;Decreased balance;Decreased mobility;Decreased cognition;Decreased knowledge of use of DME;Decreased safety awareness;Decreased knowledge of precautions;Pain PT Treatment Interventions: DME instruction;Gait training;Functional mobility training;Therapeutic activities;Therapeutic exercise;Balance training;Patient/family education;Wheelchair mobility training   PT Goals Acute Rehab PT Goals PT Goal Formulation: With patient Time For Goal Achievement: 07/09/12 Potential to Achieve Goals: Good Pt will go Supine/Side to Sit: with modified independence;with rail PT Goal: Supine/Side to Sit - Progress: Goal set today Pt will Sit at Edge of Bed: with modified independence;3-5 min;with bilateral upper extremity support PT Goal: Sit at Edge Of Bed - Progress: Goal set today Pt will go Sit to Stand: with min assist;with upper extremity assist PT Goal: Sit to Stand - Progress: Goal set today Pt will Transfer Bed to Chair/Chair to Bed: with min assist PT Transfer Goal: Bed to Chair/Chair to Bed - Progress: Goal set today Pt will Perform Home Exercise Program: with supervision, verbal cues required/provided PT Goal: Perform Home Exercise Program - Progress: Goal set today Pt will Propel Wheelchair: 51 - 150 feet;with supervision PT Goal: Propel Wheelchair - Progress: Goal set today  Visit Information  Last PT Received On: 06/25/12 Assistance Needed: +2    Subjective Data  Subjective: "I am  weak." Patient Stated Goal: To go to  rehab and then home   Prior Functioning  Home Living Lives With: Alone Available Help at Discharge: Family;Other (Comment) (daughter stayed with pt. PTA) Type of Home: House Home Access: Ramped entrance Home Layout: One level Bathroom Shower/Tub: Other (comment) (bathroom small) Bathroom Accessibility: No Home Adaptive Equipment: Wheelchair - IT trainer;Bedside commode/3-in-1;Walker - rolling;Walker - four wheeled;Grab bars in shower;Grab bars around toilet;Hand-held shower hose;Shower chair without back;Other (comment) (lift chair) Additional Comments: Pt was using the scooter in the home PTA and walking with Rollator up to a month ago Prior Function Level of Independence: Independent;Needs assistance Needs Assistance: Transfers;Bathing;Dressing;Feeding;Grooming;Toileting;Meal Prep;Light Housekeeping Bath: Moderate Dressing: Moderate Feeding: Supervision/set-up Grooming: Moderate Toileting: Other (comment) (A going in bathroom and cleaning) Meal Prep: Total Light Housekeeping: Total Transfer Assistance: Modif Independent with lift chair to motorized wheelchair Able to Take Stairs?: No Driving: No Vocation: 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;HOH    Copywriter, advertising Overall Cognitive Status: Impaired Area of Impairment: Memory;Attention;Following commands;Safety/judgement;Awareness of deficits;Problem solving Arousal/Alertness: Awake/alert Orientation Level: Time Behavior During Session: [facility] for tasks performed Current Attention Level: Focused Memory: Decreased recall of precautions Following Commands: Follows one step commands inconsistently;Follows one step commands with increased time Safety/Judgement: Decreased awareness of safety precautions;Decreased safety judgement for tasks assessed Problem Solving: Son states that pt has been declining in mental status for some time now, memory declining per son    Extremity/Trunk Assessment Right  Lower Extremity Assessment RLE ROM/Strength/Tone: Deficits RLE ROM/Strength/Tone Deficits: grossly 3/5 Left Lower Extremity Assessment LLE ROM/Strength/Tone: Deficits;Unable to fully assess;Due to pain LLE ROM/Strength/Tone Deficits: grossly 2-/5 knee and hip   Balance Static Sitting Balance Static Sitting - Balance Support: Bilateral upper extremity supported;Feet supported Static Sitting - Level of Assistance: 4: Min assist Static Sitting - Comment/# of Minutes: Losing balance posteriorly at times.  Sat 3 minutes at EOB Static Standing Balance Static Standing - Balance Support: Bilateral upper extremity supported;During functional activity Static Standing - Level of Assistance: 3: Mod assist Static Standing - Comment/# of Minutes: Unable to achieve full uprigth static stance for even 10 seconds  End of Session PT - End of Session Equipment Utilized During Treatment: Gait belt Activity Tolerance: Patient limited by fatigue;Patient limited by pain Patient left: in chair;with call bell/phone within reach;with family/visitor present Nurse Communication: Mobility status;Need for lift equipment       INGOLD,Cherrise Occhipinti 06/25/2012, 11:06 AM  [facility] [phone] (pager)

## 2012-06-25 NOTE — Progress Notes (Signed)
Order received, chart reviewed, noted PT is recommending SNF and pt/son are agreeable to this. Will defer OT eval to that facility. Acute OT will sign off.

## 2012-06-26 LAB — CBC
MCH: 33.2 pg (ref 26.0–34.0)
MCV: 97.9 fL (ref 78.0–100.0)
Platelets: 128 10*3/uL — ABNORMAL LOW (ref 150–400)
RBC: 2.89 MIL/uL — ABNORMAL LOW (ref 4.22–5.81)

## 2012-06-26 LAB — BASIC METABOLIC PANEL
CO2: 26 mEq/L (ref 19–32)
Calcium: 8.9 mg/dL (ref 8.4–10.5)
Glucose, Bld: 115 mg/dL — ABNORMAL HIGH (ref 70–99)
Sodium: 135 mEq/L (ref 135–145)

## 2012-06-26 MED ORDER — SODIUM POLYSTYRENE SULFONATE 15 GM/60ML PO SUSP
30.0000 g | Freq: Once | ORAL | Status: AC
  Administered 2012-06-26: 30 g via ORAL
  Filled 2012-06-26: qty 120

## 2012-06-26 NOTE — Progress Notes (Signed)
S: Pain controlled O:BP 132/66  Pulse 110  Temp(Src) 99.2 F (37.3 C) (Oral)  Resp 18  Ht 5\' 8"  (1.727 m)  Wt 68.601 kg (151 lb 3.8 oz)  BMI 23 kg/m2  SpO2 100%  Intake/Output Summary (Last 24 hours) at 06/26/12 0851 Last data filed at 06/26/12 0700  Gross per 24 hour  Intake    840 ml  Output      0 ml  Net    840 ml   Weight change: 0.001 kg (0 oz) ZOX:WRUEA and alert CVS:RRR Resp:clear Abd:+ BS NTND Ext:Lt BKA.  No edema on Rt NEURO:CNI.  No asterixis   . alteplase  4 mg Intracatheter Once  . aspirin  81 mg Oral Daily  . calcium acetate  667 mg Oral TID WC  . cefTAZidime (FORTAZ)  IV  2 g Intravenous Q M,W,F-2000  . darbepoetin (ARANESP) injection - DIALYSIS  40 mcg Intravenous Q Wed-HD  . enoxaparin (LOVENOX) injection  30 mg Subcutaneous QHS  . feeding supplement (NEPRO CARB STEADY)  237 mL Oral BID BM  . ferric gluconate (FERRLECIT/NULECIT) IV  125 mg Intravenous Q M,W,F-HD  . metoprolol  50 mg Oral BID  . multivitamin  1 tablet Oral QHS  . pantoprazole  40 mg Oral Q1200  . polyethylene glycol  17 g Oral Daily  . senna  1 tablet Oral BID  . simvastatin  20 mg Oral QHS  . sodium chloride  3 mL Intravenous Q12H  . vancomycin  750 mg Intravenous Q M,W,F-HD   No results found. BMET    Component Value Date/Time   NA 135 06/26/2012 0520   K 5.4* 06/26/2012 0520   CL 98 06/26/2012 0520   CO2 26 06/26/2012 0520   GLUCOSE 115* 06/26/2012 0520   BUN 39* 06/26/2012 0520   CREATININE 5.41* 06/26/2012 0520   CALCIUM 8.9 06/26/2012 0520   CALCIUM 9.1 04/01/2009 2202   GFRNONAA 8* 06/26/2012 0520   GFRAA 9* 06/26/2012 0520   CBC    Component Value Date/Time   WBC 16.6* 06/26/2012 0520   RBC 2.89* 06/26/2012 0520   HGB 9.6* 06/26/2012 0520   HCT 28.3* 06/26/2012 0520   PLT 128* 06/26/2012 0520   MCV 97.9 06/26/2012 0520   MCH 33.2 06/26/2012 0520   MCHC 33.9 06/26/2012 0520   RDW 15.2 06/26/2012 0520   LYMPHSABS 0.9 06/21/2012 1329   MONOABS 1.4* 06/21/2012 1329   EOSABS 0.1 06/21/2012 1329   BASOSABS 0.0 06/21/2012 1329     Assessment: 1. SP Lt BKA 2. Anemia 3. Mild hyperkalemia 4. ESRD  MWF Plan: 1.  Kayexalate 30 gm x 1 today 2.  HD in AM   Cherise Fedder T

## 2012-06-26 NOTE — Progress Notes (Signed)
ANTIBIOTIC CONSULT NOTE - INITIAL  Pharmacy Consult for Vancomycin & Ceftazidime  Indication: Osteomyelitis L foot, now s/p L-BKA on 4/4  Allergies  Allergen Reactions  . Penicillins Rash    "felt like on fire"  . Morphine And Related     Agitated and sees things  . Amoxicillin     Pt reported unknown reaction  . Darvocet (Propoxyphene-Acetaminophen)     Agitated and sees things  . Keflex (Cephalexin) Other (See Comments)    Pt reported unknown reaction  . Neurontin (Gabapentin)     Agitated and confused  . Ultram (Tramadol Hcl)     Agitated and sees things. Can take small amounts  . Vancomycin Itching    Vital Signs: Temp src: Oral (04/06 0502) BP: 132/66 mmHg (04/06 0502) Pulse Rate: 110 (04/06 0502)   Recent Labs  06/24/12 1416 06/25/12 0530 06/26/12 0520  WBC 15.6* 17.1* 16.6*  HGB 8.9* 9.5* 9.6*  PLT 143* 190 128*  CREATININE 6.51*  6.42* 3.80* 5.41*   Estimated Creatinine Clearance: 8.4 ml/min (by C-G formula based on Cr of 5.41).  Microbiology: Recent Results (from the past 720 hour(s))  MRSA PCR SCREENING     Status: None   Collection Time    06/22/12  8:06 AM      Result Value Range Status   MRSA by PCR NEGATIVE  NEGATIVE Final   Comment:            The GeneXpert MRSA Assay (FDA     approved for NASAL specimens     only), is one component of a     comprehensive MRSA colonization     surveillance program. It is not     intended to diagnose MRSA     infection nor to guide or     monitor treatment for     MRSA infections.    Assessment: 79 y/o M with ESRD MWF s/p left great toe amputation who is now s/p L-BKA from 4/4. WBC 16.6<17.1. Afebrile. Last HD on 4/4 with 300 BFR for 4 hours.   Antibiotics Levaquin 4/1 >> 4/2 Ceftazidime 4/3 >> Vancomycin 4/3 >>  Goal of Therapy:  Pre-HD vancomycin 15-25 mgc/ml Clinical Improvement Eradication of infection  Plan:  -Continue vancomycin 750mg  IV qHD-MWF  -Continue ceftazidime 2gm IV  qHD-MWF -Trend WBC, temp, clinical status -F/U MD plans for DC antibiotics (possibly 48 hours post-op) -If antibiotics are not stopped soon, consider pre-HD vancomycin level  Abran Duke, PharmD Clinical Pharmacist Phone: [phone] Pager: [phone] 06/26/2012 9:22 AM

## 2012-06-26 NOTE — Progress Notes (Signed)
Patient ID: Gerald Tucker, male   DOB: [birth year], 79 y.o.   MRN: [id number] Sleeping comfortably. Daughter is present in the room with the patient. Has had some difficulty with sleeping confusion. Elected not to awaken in today for dressing change. Will check his wound in the morning at 72 hours. Await rehabilitation versus skilled nursing facility recommendations.

## 2012-06-26 NOTE — Progress Notes (Signed)
Subjective: The patient had left BKA 4/4. He states that he is doing okay and eating well. He finished his whole breakfast this morning. He slept well overnight and is still sleepy. His daughter is in the room. He is not having pain anywhere including either leg or his back. Talked with her about going to rehab place and she said one is near his house and dialysis and will look into the name. She would like him to stay until Wednesday or Thursday for convenience of dialysis before he leaves. No chest pain. No abdominal pain. He is not having any nausea/vomiting/diarrhea. He does not have any complaints or needs at this time.   Objective: Vital signs in last 24 hours: Filed Vitals:   06/25/12 1300 06/25/12 1800 06/25/12 2044 06/26/12 0502  BP: 114/54 129/60 142/70 132/66  Pulse: 110 101 109 110  Temp: 98.3 F (36.8 C) 98 F (36.7 C) 99.2 F (37.3 C)   TempSrc: Oral Oral Oral Oral  Resp: 18 18 18 18   Height:   5\' 8"  (1.727 m)   Weight:   151 lb 3.8 oz (68.601 kg)   SpO2: 98% 98% 94% 100%   Weight change: 0 oz (0.001 kg)  Intake/Output Summary (Last 24 hours) at 06/26/12 0902 Last data filed at 06/26/12 0700  Gross per 24 hour  Intake    840 ml  Output      0 ml  Net    840 ml    Physical Exam Blood pressure 132/66, pulse 110, temperature 99.2 F (37.3 C), temperature source Oral, resp. rate 18, height 5\' 8"  (1.727 m), weight 151 lb 3.8 oz (68.601 kg), SpO2 100.00%. General: No acute distress, alert and oriented x 3, appears stated age  HEENT: PERRL, EOMI, moist mucous membranes  Cardiovascular: Irregular rhythm, soft blowing systolic murmur  Respiratory: Clear to auscultation bilaterally, no wheezes, rales, or rhonchi  Abdomen: Soft, nondistended, nontender, bowel sounds present  Extremities: Left upper extremity with aVF. Left lower extremity with new BKA and stump wrapped. RLE with stable dry gangrene over R 4th toe and blister over R great toe.  Neuro: Not anxious appearing, no  depressed mood, normal affect.    Lab Results: Basic Metabolic Panel:  Recent Labs Lab 06/22/12 0550  06/24/12 1416 06/25/12 0530 06/26/12 0520  NA 137  < > 141  142 140 135  K 4.1  < > 4.3  4.3 3.8 5.4*  CL 99  < > 102  103 101 98  CO2 24  < > 24  23 28 26   GLUCOSE 104*  < > 73  73 86 115*  BUN 50*  < > 46*  46* 21 39*  CREATININE 7.10*  < > 6.51*  6.42* 3.80* 5.41*  CALCIUM 8.9  < > 8.8  8.7 8.7 8.9  PHOS 4.3  --  4.8*  --   --   < > = values in this interval not displayed. Liver Function Tests:  Recent Labs Lab 06/22/12 0550 06/24/12 1416  ALBUMIN 2.1* 1.9*   CBC:  Recent Labs Lab 06/21/12 1329  06/25/12 0530 06/26/12 0520  WBC 17.9*  < > 17.1* 16.6*  NEUTROABS 15.6*  --   --   --   HGB 9.4*  < > 9.5* 9.6*  HCT 28.5*  < > 28.3* 28.3*  MCV 97.9  < > 98.6 97.9  PLT 382  < > 190 128*  < > = values in this interval not displayed. Studies/Results: No results  found. Medications:  Medications reviewed  Scheduled Meds: . alteplase  4 mg Intracatheter Once  . aspirin  81 mg Oral Daily  . calcium acetate  667 mg Oral TID WC  . cefTAZidime (FORTAZ)  IV  2 g Intravenous Q M,W,F-2000  . darbepoetin (ARANESP) injection - DIALYSIS  40 mcg Intravenous Q Wed-HD  . enoxaparin (LOVENOX) injection  30 mg Subcutaneous QHS  . feeding supplement (NEPRO CARB STEADY)  237 mL Oral BID BM  . ferric gluconate (FERRLECIT/NULECIT) IV  125 mg Intravenous Q M,W,F-HD  . metoprolol  50 mg Oral BID  . multivitamin  1 tablet Oral QHS  . pantoprazole  40 mg Oral Q1200  . polyethylene glycol  17 g Oral Daily  . senna  1 tablet Oral BID  . simvastatin  20 mg Oral QHS  . sodium chloride  3 mL Intravenous Q12H  . vancomycin  750 mg Intravenous Q M,W,F-HD   Continuous Infusions:   PRN Meds:.sodium chloride, acetaminophen, diphenhydrAMINE, fentaNYL, ondansetron, sodium chloride  Assessment/Plan:  Gerald Tucker is a 79 year old gentleman with a history of ESRD on HD (MWF), atrial  fibrillation, and peripheral vascular disease with recent amputation of gangrenous left great toe one week ago. He presents directly from his surgeon's office where he was seen for a followup visit and directed to come to the ED for evaluation of pain, swelling and purulent discharge from his left foot wound.   PVD with Nonhealing left foot wound  S/P L BKA POD 2: Doing well with pain and no phantom pains at this time. PMR doctor came to evaluate and likely thinks SNF would be better place for rehabillatation. Still on IV antibiotics for 48 hours post op unless surgery thinks he needs them longer. -Fentanyl prn for pain -Vascular surgery on board for post op management -ID on board and appreciate recs for IV for 48 hours post-operatively. Will stop IV antibiotics tomorrow.  Dry gangrene on R foot - Did have x-ray without obvious osteomyelitis however may need MRI as out-patient to definitively rule out.  Agitation and Delirium, improved today Patient with known sensitivities to narcotics and prone to hospital delirium, not new per family. Current infection can make this worse. -prudence with pain medications, balancing pain control with side effects -call MD if needed for PRN haldol  ESRD on HD (MWF)   -cont HD per renal -Continue PhosLo  -K 5.4 this AM and getting some kayexylate and HD tomorrow -follow renal function panel  Atrial fibrillation without RVR  In Afib without RVR on admission. Patient is only on aspirin at home. Chads 2 score of 2.  Risk for RVR increased given current infection. Cont to monitor.  -Continue aspirin, metoprolol   Chronic back pain He has struggled for >20 years with chronic back pain. -continue PRN fentanyl  Constipation  -cont MiraLax and senna  -encourage oral hydration  Hypertension -  stable -Hold home imdur -Continue metoprolol, hold in AM on HD days  DVT  -Lovenox, renally dosed  FEN  -Normal saline lock  -Renal diet   Dispo   Anticipate d/c in 1-2 days to SNF, SW to assist Patient does have a PCP and will not be needing OPC f/u    LOS: 5 days   Tucker, Gerald Maselli 06/26/2012, 9:02 AM

## 2012-06-27 ENCOUNTER — Encounter (HOSPITAL_COMMUNITY): Payer: Self-pay | Admitting: Vascular Surgery

## 2012-06-27 DIAGNOSIS — L97509 Non-pressure chronic ulcer of other part of unspecified foot with unspecified severity: Secondary | ICD-10-CM

## 2012-06-27 LAB — CBC
HCT: 26.1 % — ABNORMAL LOW (ref 39.0–52.0)
MCHC: 33.3 g/dL (ref 30.0–36.0)
MCV: 97.4 fL (ref 78.0–100.0)
RDW: 15.1 % (ref 11.5–15.5)

## 2012-06-27 LAB — RENAL FUNCTION PANEL
Albumin: 1.8 g/dL — ABNORMAL LOW (ref 3.5–5.2)
BUN: 51 mg/dL — ABNORMAL HIGH (ref 6–23)
Calcium: 8.6 mg/dL (ref 8.4–10.5)
Creatinine, Ser: 7.04 mg/dL — ABNORMAL HIGH (ref 0.50–1.35)
Phosphorus: 4.8 mg/dL — ABNORMAL HIGH (ref 2.3–4.6)
Potassium: 3.1 mEq/L — ABNORMAL LOW (ref 3.5–5.1)

## 2012-06-27 MED ORDER — FENTANYL CITRATE 0.05 MG/ML IJ SOLN
INTRAMUSCULAR | Status: AC
  Administered 2012-06-27: 25 ug via INTRAVENOUS
  Filled 2012-06-27: qty 2

## 2012-06-27 MED ORDER — FENTANYL CITRATE 0.05 MG/ML IJ SOLN
INTRAMUSCULAR | Status: AC
  Filled 2012-06-27: qty 2

## 2012-06-27 MED ORDER — FENTANYL CITRATE 0.05 MG/ML IJ SOLN
INTRAMUSCULAR | Status: AC
  Administered 2012-06-27: 12.5 ug via INTRAVENOUS
  Filled 2012-06-27: qty 2

## 2012-06-27 NOTE — Progress Notes (Signed)
Subjective: The patient had left BKA 4/4. He was seen in dialysis today, he was resting in bed. He wakes up and states that he is feeling fine. No phantom pain. He has no needs or complaints at this time.  Denies fevers, chills, nausea, vomiting, diarrhea.  Family is still looking at nursing facilities for the patient's discharge.  Objective: Vital signs in last 24 hours: Filed Vitals:   06/27/12 0716 06/27/12 0737 06/27/12 0800 06/27/12 0826  BP: 147/85 151/77 132/67 140/73  Pulse: 88 93 86 96  Temp:      TempSrc:      Resp:      Height:      Weight:      SpO2:       Weight change: 0 lb (0 kg)  Intake/Output Summary (Last 24 hours) at 06/27/12 0835 Last data filed at 06/27/12 0615  Gross per 24 hour  Intake    240 ml  Output      0 ml  Net    240 ml    Physical Exam Blood pressure 140/73, pulse 96, temperature 98.2 F (36.8 C), temperature source Oral, resp. rate 19, height 5\' 8"  (1.727 m), weight 148 lb 9.4 oz (67.4 kg), SpO2 95.00%. General: No acute distress, alert and oriented x 3, appears stated age  HEENT: PERRL, EOMI, moist mucous membranes  Cardiovascular: Irregular rhythm, soft blowing systolic murmur  Respiratory: Clear to auscultation bilaterally, no wheezes, rales, or rhonchi  Abdomen: Soft, nondistended, nontender, bowel sounds present  GU: condom cath in place Extremities: Left upper extremity with aVF. Left lower extremity s/p BKA, stump wrapped. RLE with dry gangrene over R 4th toe and blister as well as new black eschar over R great toe.  Neuro: Not anxious appearing, no depressed mood, normal affect.    Lab Results: Basic Metabolic Panel:  Recent Labs Lab 06/24/12 1416  06/26/12 0520 06/27/12 0710  NA 141  142  < > 135 139  K 4.3  4.3  < > 5.4* 3.1*  CL 102  103  < > 98 98  CO2 24  23  < > 26 27  GLUCOSE 73  73  < > 115* 87  BUN 46*  46*  < > 39* 51*  CREATININE 6.51*  6.42*  < > 5.41* 7.04*  CALCIUM 8.8  8.7  < > 8.9 8.6  PHOS  4.8*  --   --  4.8*  < > = values in this interval not displayed. Liver Function Tests:  Recent Labs Lab 06/24/12 1416 06/27/12 0710  ALBUMIN 1.9* 1.8*   CBC:  Recent Labs Lab 06/21/12 1329  06/26/12 0520 06/27/12 0710  WBC 17.9*  < > 16.6* 16.1*  NEUTROABS 15.6*  --   --   --   HGB 9.4*  < > 9.6* 8.7*  HCT 28.5*  < > 28.3* 26.1*  MCV 97.9  < > 97.9 97.4  PLT 382  < > 128* 105*  < > = values in this interval not displayed. Studies/Results: No results found. Medications:  Medications reviewed  Scheduled Meds: . alteplase  4 mg Intracatheter Once  . aspirin  81 mg Oral Daily  . calcium acetate  667 mg Oral TID WC  . darbepoetin (ARANESP) injection - DIALYSIS  40 mcg Intravenous Q Wed-HD  . enoxaparin (LOVENOX) injection  30 mg Subcutaneous QHS  . feeding supplement (NEPRO CARB STEADY)  237 mL Oral BID BM  . fentaNYL      .  ferric gluconate (FERRLECIT/NULECIT) IV  125 mg Intravenous Q M,W,F-HD  . metoprolol  50 mg Oral BID  . multivitamin  1 tablet Oral QHS  . pantoprazole  40 mg Oral Q1200  . polyethylene glycol  17 g Oral Daily  . senna  1 tablet Oral BID  . simvastatin  20 mg Oral QHS  . sodium chloride  3 mL Intravenous Q12H   Continuous Infusions:   PRN Meds:.sodium chloride, acetaminophen, diphenhydrAMINE, fentaNYL, ondansetron, sodium chloride  Assessment/Plan:  Mr. Stavros is a 79 year old gentleman with a history of ESRD on HD (MWF), atrial fibrillation, and peripheral vascular disease with recent amputation of gangrenous left great toe one week ago. He presents directly from his surgeon's office where he was seen for a followup visit and directed to come to the ED for evaluation of pain, swelling and purulent discharge from his left foot wound.   PVD with Nonhealing left foot wound  S/P L BKA POD 3 (on June 24, 2012): Patient continues to do well with minimal pain and numbness and some limb pain. Plan for SNF discharge when bed is available. Vascular  surgery unwrapped the stump for evaluation today and it looks like it is healing well. Warm, well-perfused.  -to SNF, hopefully Wednesday after HD if bed available -antibiotics discontinued -fentanyl PRN  Dry gangrene on R foot Did have x-ray without obvious osteomyelitis however may need MRI as out-patient to definitively rule out. Patient has severe peripheral vascular disease and will need close monitoring of his right foot. Discussed this with the family.  -Patient will followup as an outpatient.  Agitation and Delirium, resolved Patient with known sensitivities to narcotics and prone to hospital delirium, not new per family. Current infection can make this worse. -cont prudence with pain medications, balancing pain control with side effects  ESRD on HD (MWF)   HD today -Continue PhosLo  -K 3.1 today, likely from kayexylate yesterday -follow renal function panel  Atrial fibrillation without RVR  In Afib without RVR on admission. Patient is only on aspirin at home. Chads 2 score of 2.  Risk for RVR increased given current infection. Cont to monitor.  -Continue aspirin, metoprolol   Chronic back pain He has struggled for >20 years with chronic back pain. -continue PRN fentanyl  Constipation  -cont MiraLax and senna  -encourage oral hydration  Hypertension -  stable -Hold home imdur -Continue metoprolol, hold in AM on HD days  DVT  -Lovenox, renally dosed  FEN  -Normal saline lock  -Renal diet   Dispo  Anticipate d/c in 1-2 days to SNF, waiting for SW update Patient does have a PCP and will not be needing OPC f/u    LOS: 6 days   Denton Ar 06/27/2012, 8:35 AM

## 2012-06-27 NOTE — Progress Notes (Signed)
Rehab admissions - Evaluated for possible admission.  Please see rehab consult by Dr. Riley Kill recommending SNF.  Note also today from [place] [facility] recommending SNF.  Agree with need for SNF.  Call me for questions.  #[phone]

## 2012-06-27 NOTE — Progress Notes (Signed)
Physical Therapy Treatment Patient Details Name: Gerald Tucker MRN: [id number] DOB: 11-28-1918 Today's Date: 06/27/2012 Time: 1435-1450 PT Time Calculation (min): 15 min  PT Assessment / Plan / Recommendation Comments on Treatment Session  Pt with HD this am; is not feeling well this pm.  Pt did not tolerate PT.  Will try to schedule PT sessions am of Tues and Thurs (not HD days)    Follow Up Recommendations  SNF     Does the patient have the potential to tolerate intense rehabilitation     Barriers to Discharge        Equipment Recommendations  None recommended by PT    Recommendations for Other Services    Frequency Min 2X/week   Plan Discharge plan remains appropriate;Frequency needs to be updated    Precautions / Restrictions Precautions Precautions: Fall Restrictions Weight Bearing Restrictions: No   Pertinent Vitals/Pain C/o pain in left leg when touched    Mobility  Bed Mobility Bed Mobility: Rolling Right;Right Sidelying to Sit;Sitting - Scoot to [place] of Bed Rolling Right: 4: Min assist;With rail Right Sidelying to Sit: 3: Mod assist;With rails;HOB elevated Sitting - Scoot to Edge of Bed: 3: Mod assist Details for Bed Mobility Assistance: pt needs assist to elevat trunk.  Cries out in pain if left leg is inadvertently touched. Pt only tolerated sitting on edge of bed a few minutes.  He wanted to lie back down Ambulation/Gait Ambulation/Gait Assistance: Not tested (comment) Stairs: No Wheelchair Mobility Wheelchair Mobility: No    Exercises General Exercises - Lower Extremity Hip ABduction/ADduction: AAROM;Left;Sidelying;5 reps Hip Flexion/Marching: AAROM;Left;Supine   PT Diagnosis:    PT Problem List:   PT Treatment Interventions:     PT Goals Acute Rehab PT Goals Time For Goal Achievement: 07/09/12 Pt will go Supine/Side to Sit: with rail;with modified independence PT Goal: Supine/Side to Sit - Progress: Progressing toward goal Pt will Sit at [facility] of  Bed: with modified independence;3-5 min;with bilateral upper extremity support PT Goal: Sit at Edge Of Bed - Progress: Progressing toward goal  Visit Information  Last PT Received On: 06/27/12 Assistance Needed: +2    Subjective Data  Subjective: "Let me lay down" Patient Stated Goal: To go to rehab and then home   Cognition  Cognition Overall Cognitive Status: Impaired Area of Impairment: Memory;Attention;Following commands;Safety/judgement;Awareness of deficits;Problem solving Arousal/Alertness: Awake/alert Orientation Level: Time Behavior During Session: [facility] for tasks performed Current Attention Level: Focused Memory: Decreased recall of precautions Memory Deficits: Pt repeatedly asking what day it is and what time it is. Does not remember answers to questions Following Commands: Follows one step commands inconsistently;Follows one step commands with increased time Safety/Judgement: Decreased awareness of safety precautions;Decreased safety judgement for tasks assessed Problem Solving: Son states that pt has been declining in mental status for some time now, memory declining per son    Balance  Static Sitting Balance Static Sitting - Balance Support: Bilateral upper extremity supported;Feet supported;Right upper extremity supported;Left upper extremity supported;No upper extremity supported Static Sitting - Level of Assistance: 5: Stand by assistance Static Sitting - Comment/# of Minutes: Pt only sat on EOB< 1 minute  End of Session PT - End of Session Activity Tolerance: Patient limited by fatigue;Patient limited by pain Patient left: in bed   GP     Donnetta Hail 06/27/2012, 3:04 PM

## 2012-06-27 NOTE — Progress Notes (Signed)
[facility]    Subjective: Sleeping comfortably initially   Antibiotics:  Anti-infectives   Start     Dose/Rate Route Frequency Ordered Stop   06/24/12 2000  cefTAZidime (FORTAZ) 2 g in dextrose 5 % 50 mL IVPB  Status:  Discontinued     2 g 100 mL/hr over 30 Minutes Intravenous Every M-W-F (2000) 06/23/12 1524 06/27/12 0834   06/24/12 1200  vancomycin (VANCOCIN) IVPB 750 mg/150 ml premix  Status:  Discontinued     750 mg 150 mL/hr over 60 Minutes Intravenous Every M-W-F (Hemodialysis) 06/23/12 1524 06/27/12 0834   06/23/12 1800  vancomycin (VANCOCIN) 1,500 mg in sodium chloride 0.9 % 500 mL IVPB     1,500 mg 250 mL/hr over 120 Minutes Intravenous  Once 06/23/12 1524 06/23/12 1936   06/23/12 1800  cefTAZidime (FORTAZ) 2 g in dextrose 5 % 50 mL IVPB     2 g 100 mL/hr over 30 Minutes Intravenous  Once 06/23/12 1524 06/23/12 2127   06/23/12 1600  levofloxacin (LEVAQUIN) IVPB 500 mg  Status:  Discontinued     500 mg 100 mL/hr over 60 Minutes Intravenous Every 48 hours 06/21/12 1456 06/22/12 1827   06/21/12 1500  levofloxacin (LEVAQUIN) IVPB 750 mg     750 mg 100 mL/hr over 90 Minutes Intravenous  Once 06/21/12 1456 06/21/12 1725      Medications: Scheduled Meds: . alteplase  4 mg Intracatheter Once  . aspirin  81 mg Oral Daily  . calcium acetate  667 mg Oral TID WC  . darbepoetin (ARANESP) injection - DIALYSIS  40 mcg Intravenous Q Wed-HD  . enoxaparin (LOVENOX) injection  30 mg Subcutaneous QHS  . feeding supplement (NEPRO CARB STEADY)  237 mL Oral BID BM  . ferric gluconate (FERRLECIT/NULECIT) IV  125 mg Intravenous Q M,W,F-HD  . metoprolol  50 mg Oral BID  . multivitamin  1 tablet Oral QHS  . pantoprazole  40 mg Oral Q1200  . polyethylene glycol  17 g Oral Daily  . senna  1 tablet Oral BID  . simvastatin  20 mg Oral QHS  . sodium chloride  3 mL Intravenous Q12H   Continuous Infusions:  PRN Meds:.sodium chloride, acetaminophen,  diphenhydrAMINE, fentaNYL, ondansetron, sodium chloride   Objective: Weight change: 0 lb (0 kg)  Intake/Output Summary (Last 24 hours) at 06/27/12 1722 Last data filed at 06/27/12 1107  Gross per 24 hour  Intake    240 ml  Output   1747 ml  Net  -1507 ml   Blood pressure 90/41, pulse 92, temperature 98.2 F (36.8 C), temperature source Oral, resp. rate 16, height 5\' 8"  (1.727 m), weight 144 lb 10 oz (65.6 kg), SpO2 98.00%. Temp:  [97.4 F (36.3 C)-98.6 F (37 C)] 98.2 F (36.8 C) (04/07 1648) Pulse Rate:  [54-106] 92 (04/07 1648) Resp:  [16-19] 16 (04/07 1648) BP: (90-151)/(41-85) 90/41 mmHg (04/07 1648) SpO2:  [95 %-99 %] 98 % (04/07 1648) Weight:  [144 lb 10 oz (65.6 kg)-151 lb 3.8 oz (68.601 kg)] 144 lb 10 oz (65.6 kg) (04/07 1107)  Physical Exam: General: sleeping, but then woke up briefly while I spoke to his son  BKA wrapped on the left Right with black eschars on toe  Lab Results:  Recent Labs  06/26/12 0520 06/27/12 0710  WBC 16.6* 16.1*  HGB 9.6* 8.7*  HCT 28.3* 26.1*  PLT 128* 105*    BMET  Recent Labs  06/26/12 0520 06/27/12 0710  NA 135 139  K 5.4* 3.1*  CL 98 98  CO2 26 27  GLUCOSE 115* 87  BUN 39* 51*  CREATININE 5.41* 7.04*  CALCIUM 8.9 8.6    Micro Results: Recent Results (from the past 240 hour(s))  MRSA PCR SCREENING     Status: None   Collection Time    06/22/12  8:06 AM      Result Value Range Status   MRSA by PCR NEGATIVE  NEGATIVE Final   Comment:            The GeneXpert MRSA Assay (FDA     approved for NASAL specimens     only), is one component of a     comprehensive MRSA colonization     surveillance program. It is not     intended to diagnose MRSA     infection nor to guide or     monitor treatment for     MRSA infections.    Studies/Results: No results found.    Assessment/Plan: Gerald Tucker is a 79 y.o. male  with gangrenous toe that is nonviable now with persistent gangrene in the wound bed after  amputation of the toe who is in need of more proximal amputation. He also has evidence of poor blood flow to his opposite foot where he apparently has had purulent drainage in the past.   #1 Necrotic nonviable toe ulcer bed:  Sp BKA --this should be cured and abx are off after 48 hours post surgery   #2 ? OSteo in the opposite toe:  plain films without ostEO,. When pt is able to have MRI would obtain one to see ? Osteo here. At present I would NOT push this exam and he does not appear likely to be able to comply with the study at present  I will sign off for now. Please call with further questions and please call my clinic @ [phone] to arrange Hospital followup in one months time   LOS: 6 days   [name] 06/27/2012, 5:22 PM

## 2012-06-27 NOTE — Progress Notes (Signed)
Hemodialysis-Pt tolerated procedure well. 2 episodes of severe pain in L post op site, medicated x2 with fentanyl. No other complaints. UF 1.7L total.

## 2012-06-27 NOTE — Progress Notes (Signed)
PT evaluation noted. Patient limited by right knee instability with inability to achieve upright stance.  Would recommend pursing SNF for follow up therapies.

## 2012-06-27 NOTE — Procedures (Signed)
I was present at this dialysis session. I have reviewed the session itself and made appropriate changes.   Vinson Moselle, MD [employer] 06/27/2012, 12:01 PM

## 2012-06-27 NOTE — Progress Notes (Signed)
Subjective:  Seen on dialysis, no complaints  Objective: Vital signs in last 24 hours: Temp:  [97.4 F (36.3 C)-98.4 F (36.9 C)] 98.2 F (36.8 C) (04/07 0707) Pulse Rate:  [86-106] 86 (04/07 0800) Resp:  [18-19] 19 (04/07 0707) BP: (123-151)/(55-85) 132/67 mmHg (04/07 0800) SpO2:  [95 %-100 %] 95 % (04/07 0707) Weight:  [67.4 kg (148 lb 9.4 oz)-68.601 kg (151 lb 3.8 oz)] 67.4 kg (148 lb 9.4 oz) (04/07 0707) Weight change: 0 kg (0 lb)  Intake/Output from previous day: 04/06 0701 - 04/07 0700 In: 240 [P.O.:240] Out: -    EXAM: General appearance:  Alert, in no apparent distress Resp:  CTA without rales, rhonchi, or wheezes Cardio:  RRR with Gr I/VI systolic murmur, no rub GI: + BS, soft and nontender Extremities:  Left BKA wrapped, no edema on right (eschar on 4th toe)  Access:    Left IJ catheter with BFR 350 cc/min  Lab Results:  Recent Labs  06/26/12 0520 06/27/12 0710  WBC 16.6* 16.1*  HGB 9.6* 8.7*  HCT 28.3* 26.1*  PLT 128* 105*   BMET:  Recent Labs  06/24/12 1416  06/26/12 0520 06/27/12 0710  NA 141  142  < > 135 139  K 4.3  4.3  < > 5.4* 3.1*  CL 102  103  < > 98 98  CO2 24  23  < > 26 27  GLUCOSE 73  73  < > 115* 87  BUN 46*  46*  < > 39* 51*  CREATININE 6.51*  6.42*  < > 5.41* 7.04*  CALCIUM 8.8  8.7  < > 8.9 8.6  ALBUMIN 1.9*  --   --  1.8*  < > = values in this interval not displayed. No results found for this basename: PTH,  in the last 72 hours Iron Studies: No results found for this basename: IRON, TIBC, TRANSFERRIN, FERRITIN,  in the last 72 hours  Dialysis Orders: Center: [place] on MWF .  EDW 72 kg HD Bath 2K/ 2Ca Time 3:45 Heparin 3000u bolus/ 2000 u mid. Access Left IJ BFR 400 DFR A1,5  Hectorol 0 mcg IV/HD Epogen 1800 Units IV/HD Venofer 100 mg IV through 07/01/12   Assessment/Plan: 1. Left foot osteomyelitis - s/p left BKA 4/4 by Dr. Arbie Cookey, Vancomycin/Fortaz started 4/4. 2. ESRD - HD on MWF @ [place]; K 3.1.  Use 4K bath  today. 3. HTN/Volume - BP 151/77 on Lopressor 50 mg bid; current wt 67.4 kg, previous EDW 72.  Establish new EDW. 4. Anemia - Hgb 8.7, Aranesp 40 mcg on Wed. 5. Secondary hyperparathyroidism - Ca 8.6 (10.2 corrected), P 4.8; Phoslo 1 with meals. 6. Nutrition - Alb 1.8, renal diet, vitamin, Nepro. 7. Right foot eschar - no osteomyelitis per x-ray 4/3.   LOS: 6 days   LYLES,CHARLES 06/27/2012,8:16 AM   Patient seen and examined.  Agree with assessment and plan as above. Vinson Moselle  MD [phone] pgr    [phone] cell 06/27/2012, 12:04 PM

## 2012-06-27 NOTE — Progress Notes (Addendum)
[facility] [place]  Postoperative Visit - Amputation  Date of Surgery: 06/21/2012 - 06/24/2012 Procedure(s): LEFT BELOW KNEE AMPUTATION Left Surgeon: Surgeon(s): Larina Earthly, MD POD: 3 Days Post-Op  Subjective Gerald Tucker is a 79 y.o. male who is S/P Left Procedure(s): LEFT BELOW KNEE AMPUTATION.  Pt.denies increased pain in the stump. The patient notes pain is well controlled. Pt. denies phantom pain.  Significant Diagnostic Studies: CBC Lab Results  Component Value Date   WBC 16.1* 06/27/2012   HGB 8.7* 06/27/2012   HCT 26.1* 06/27/2012   MCV 97.4 06/27/2012   PLT 105* 06/27/2012    BMET    Component Value Date/Time   NA 139 06/27/2012 0710   K 3.1* 06/27/2012 0710   CL 98 06/27/2012 0710   CO2 27 06/27/2012 0710   GLUCOSE 87 06/27/2012 0710   BUN 51* 06/27/2012 0710   CREATININE 7.04* 06/27/2012 0710   CALCIUM 8.6 06/27/2012 0710   CALCIUM 9.1 04/01/2009 2202   GFRNONAA 6* 06/27/2012 0710   GFRAA 7* 06/27/2012 0710    COAG Lab Results  Component Value Date   INR 1.10 08/22/2010   INR 1.18 11/11/2009   INR 11.32 REPEATED TO VERIFY CRITICAL RESULT CALLED TO, READ BACK BY AND VERIFIED WITH: Mapel F,RN [id number] 07.14.11 BUCKSON G* 10/03/2009   No results found for this basename: PTT     Intake/Output Summary (Last 24 hours) at 06/27/12 1246 Last data filed at 06/27/12 1107  Gross per 24 hour  Intake    240 ml  Output   1747 ml  Net  -1507 ml   Patient Vitals for the past 24 hrs:  Urine Occurrence  06/27/12 0716 0  06/27/12 0615 0     Physical Examination  BP Readings from Last 3 Encounters:  06/27/12 111/60  06/27/12 111/60  06/07/12 148/70   Temp Readings from Last 3 Encounters:  06/27/12 98.6 F (37 C)   06/27/12 98.6 F (37 C)   05/08/12 98.7 F (37.1 C) Oral   SpO2 Readings from Last 3 Encounters:  06/27/12 98%  06/27/12 98%  05/17/12 88%   Pulse Readings from Last 3 Encounters:  06/27/12 54  06/27/12 54  06/07/12 96    Pt is A&Ox3  WDWN  male with no complaints  Left amputation wound is healing well.  There is good bone coverage in the stump Stump is warm and well perfused, without drainage; without erythema Dressing changed today incision is healing well.   Assessment/plan:  Gerald Tucker is a 79 y.o. male who is s/p Left Procedure(s): LEFT BELOW KNEE AMPUTATION  The patient's stump is clean, dry, intact or viable.  Follow-up 4 weeks from surgery  Awaiting social work consult physical medicine reports patient will not qualify for CIR needs SNF.  Cl[facility] 12:46 PM 06/27/[phone]  I have examined the patient, reviewed and agree with above.  Esequiel Kleinfelter, MD 06/27/2012 1:41 PM

## 2012-06-28 ENCOUNTER — Telehealth: Payer: Self-pay | Admitting: Vascular Surgery

## 2012-06-28 NOTE — Telephone Encounter (Signed)
Spoke with wife, said she would notify the rehab he's going to about appt, she will call us with which rehab he goes to and their phone number - kf

## 2012-06-28 NOTE — Progress Notes (Signed)
Internal Medicine Teaching Service Attending Note Date: 06/28/2012  Patient name: Gerald Tucker  Medical record number: [id number]  Date of birth: Sep 27, 1918    This patient has been seen and discussed with the house staff. Please see their note for complete details. I concur with their findings with the following additions/corrections: Possible discharge tomorrow if bed is available.  Prior to discharge please set up follow up with infectious disease(1 month) and Vascular surgery.  Lars Mage 06/28/2012, 12:34 PM

## 2012-06-28 NOTE — Clinical Social Work Placement (Addendum)
Clinical Social Work Department CLINICAL SOCIAL WORK PLACEMENT NOTE 06/28/2012  Patient:  Gerald Tucker, Gerald Tucker  Account Number:  [id number] Admit date:  06/21/2012  Clinical Social Worker:  Genelle Bal, LCSW  Date/time:  06/28/2012 02:31 PM  Clinical Social Work is seeking post-discharge placement for this patient at the following level of care:   SKILLED NURSING   (*CSW will update this form in Epic as items are completed)   06/28/2012  Patient/family provided with [facility] Department of Clinical Social Work's list of facilities offering this level of care within the geographic area requested by the patient (or if unable, by the patient's family).  06/28/2012  Patient/family informed of their freedom to choose among providers that offer the needed level of care, that participate in Medicare, Medicaid or managed care program needed by the patient, have an available bed and are willing to accept the patient.    Patient/family informed of [facility]' ownership interest in [facility], as well as of the fact that they are under no obligation to receive care at this facility.  PASARR submitted to EDS on 06/29/12 PASARR number received from EDS on 06/29/12 - [phone] [id number]  FL2 transmitted to all facilities in geographic area requested by pt/family on 06/28/12  FL2 transmitted to all facilities within larger geographic area on   Patient informed that his/her managed care company has contracts with or will negotiate with  certain facilities, including the following:     Patient/family informed of bed offers received: 06/29/12  Patient chooses bed at [facility] Physician recommends and patient chooses bed at    Patient to be transferred to [facility] on 06/29/12   Patient to be transferred to facility by ambulance  The following physician request were entered in Epic:   Additional Comments: 06/29/12: CSW talked with patient's sons Simona Huh and [place] regarding SNF placement and patient  being medically ready for discharge today. Family advised that patient can transport by ambulance. Facility notified and can take patient today. Discharge information forwarded to [facility], patient medical packet compiled and accompanied patient in transport to facility.  Fernande Boyden, Social Work Intern 06/28/2012

## 2012-06-28 NOTE — Telephone Encounter (Signed)
Message copied by Margaretmary Eddy on Tue Jun 28, 2012  3:27 PM ------      Message from: Sharee Pimple      Created: Tue Jun 28, 2012  1:49 PM      Regarding: schedule                   ----- Message -----         From: Dara Lords, PA-C         Sent: 06/25/2012  10:44 AM           To: Sharee Pimple, CMA            S/p left BKA by Dr. Arbie Cookey 06/24/12.  F/u in 4 weeks with him.            Thanks ------

## 2012-06-28 NOTE — Clinical Social Work Psychosocial (Addendum)
Clinical Social Work Department BRIEF PSYCHOSOCIAL ASSESSMENT 06/28/2012  Patient:  Gerald Tucker, Gerald Tucker     Account Number:  [id number]     Admit date:  06/21/2012  Clinical Social Worker:  Delmer Islam  Date/Time:  06/28/2012 01:15 PM  Referred by:  Physician  Date Referred:  06/27/2012 Referred for  SNF Placement   Other Referral:   Interview type:  Patient Other interview type:   Patient's Daughter: Gerald Tucker [phone])    PSYCHOSOCIAL DATA Living Status:  WITH ADULT CHILDREN Admitted from facility:   Level of care:   Primary support name:  Gerald Tucker Primary support relationship to patient:   Degree of support available:   Daughter expresses a great deal of concern for the patient's health and plans for discharge.    CURRENT CONCERNS Current Concerns  Post-Acute Placement   Other Concerns:    SOCIAL WORK ASSESSMENT / PLAN CSW intern received consult and went to speak with the patient about discharge plans. Patient is alert but not oriented to what CSW was discussing. Patient advised CSW intern to speak with his daughter Gerald Tucker) via telephone to inform her of the plans for discharge.    CSW intern made contact with daughter Gerald Tucker to inform of MD's recommendation for SNF. Daughter is agreeable to SNF placement for the patient in [place]. CSW intern provided the daughter with a list of skilled nursing facilities in [facility] via email ([email or web address]) and informed her of the process of sending clinical information out. CSW will then follow up with Ms. Gerald Tucker to provide facility responses for the patient.   Assessment/plan status:  Psychosocial Support/Ongoing Assessment of Needs Other assessment/ plan:   CSW will follow up with patient and daughter to provide bed offers   Information/referral to community resources:   CSW intern provided a list of SNF in [place] to patient's daughter via email ([email or web address])    PATIENT'S/FAMILY'S  RESPONSE TO PLAN OF CARE: Daughter was appreciative of the services provided by CSW intern.      Fernande Boyden, Social Work Intern 06/28/2012  Co-signed 06/29/12: Genelle Bal, LCSW

## 2012-06-28 NOTE — Progress Notes (Signed)
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  Pt had been run over by a tractor and had surgery on his right shoulder but was unable to have L shoulder repaired.  Pt had limited strength and ROM in both shoulder prior to his current admission.  Will try slide board transfers with pt next session.      Follow Up Recommendations  SNF     Does the patient have the potential to tolerate intense rehabilitation     Barriers to Discharge        Equipment Recommendations  None recommended by PT    Recommendations for Other Services    Frequency Min 2X/week   Plan Discharge plan remains appropriate;Frequency needs to be updated    Precautions / Restrictions Precautions Precautions: Fall Restrictions Weight Bearing Restrictions: No   Pertinent Vitals/Pain No c/o pain other than pain in broken lower incisor.  Notified RN and made a note to MD to consult oral surgeon.      Mobility  Bed Mobility Bed Mobility: Sitting - Scoot to Edge of Bed;Rolling Right;Right Sidelying to Sit Rolling Right: 5: Supervision;With rail Right Sidelying to Sit: 4: Min assist;HOB flat;With rails Supine to Sit: Not tested (comment) Sitting - Scoot to Edge of Bed: 4: Min assist Details for Bed Mobility Assistance: pt needs assist to elevat trunk.  Cries out in pain if left leg is inadvertently touched. Pt only tolerated sitting on edge of bed a few minutes.  He wanted to lie back down Transfers Transfers: Sit to Stand;Stand to [employer] Pivot Transfers Sit to Stand: 4: Min assist;From bed;With upper extremity assist Stand to Sit: 4: Min assist;To bed;With upper extremity assist Stand Pivot Transfers: 2: Max assist Squat  Pivot Transfers: 2: Max assist;With upper extremity assistance;From elevated surface Details for Transfer Assistance: Pt able to stand with flexed trunk posture and RW for 10 seconds before needing to sit secondary to fatigue.  Pt had difficulty pivoting on R foot secondary to UE weakness.   Ambulation/Gait Ambulation/Gait Assistance: Not tested (comment)    Exercises     PT Diagnosis:    PT Problem List:   PT Treatment Interventions:     PT Goals Acute Rehab PT Goals PT Goal Formulation: With patient Time For Goal Achievement: 07/09/12 Potential to Achieve Goals: Good Pt will go Supine/Side to Sit: with rail;with modified independence PT Goal: Supine/Side to Sit - Progress: Progressing toward goal Pt will Sit at Edge of Bed: with modified independence;3-5 min;with bilateral upper extremity support PT Goal: Sit at Edge Of Bed - Progress: Progressing toward goal Pt will go Sit to Stand: with min assist;with upper extremity assist PT Goal: Sit to Stand - Progress: Progressing toward goal Pt will Transfer Bed to Chair/Chair to Bed: with min assist PT Transfer Goal: Bed to Chair/Chair to Bed - Progress: Progressing toward goal Pt will Perform Home Exercise Program: with supervision, verbal cues required/provided PT Goal: Perform Home Exercise Program - Progress: Progressing toward goal Pt will Propel Wheelchair: 51 - 150 feet;with supervision  Visit Information  Last PT Received On: 06/28/12 Assistance Needed: +2    Subjective Data  Subjective: agree to work with PT.  Patient Stated Goal: To go to rehab and then home  Cognition  Cognition Overall Cognitive Status: Impaired Area of Impairment: Memory (Kept asking if I was ever in the [employer].  ) Arousal/Alertness: Awake/alert Orientation Level: Disoriented to;Time Behavior During Session: [facility] for tasks performed Current Attention Level: Focused Memory Deficits: Pt repeatedly asking me if I was in the [employer]. Could not  recall my previous answers.   Following Commands: Follows one step commands inconsistently;Follows one step commands with increased time Safety/Judgement: Decreased awareness of safety precautions;Decreased safety judgement for tasks assessed    Balance  Static Sitting Balance Static Sitting - Balance Support: Bilateral upper extremity supported;Feet supported;Right upper extremity supported;Left upper extremity supported;No upper extremity supported Static Sitting - Level of Assistance: 6: Modified independent (Device/Increase time) Static Sitting - Comment/# of Minutes: 2 minutes sitting on EOB with No LOB or need for assist.  Static Standing Balance Static Standing - Balance Support: Bilateral upper extremity supported;During functional activity Static Standing - Level of Assistance: 3: Mod assist  End of Session PT - End of Session Equipment Utilized During Treatment: Gait belt Activity Tolerance: Patient limited by fatigue Patient left: in chair;with call bell/phone within reach Nurse Communication: Mobility status;Need for lift equipment   GP     Eleesha Purkey 06/28/2012, 6:07 PM Dannya Pitkin L. Mikeyla Music DPT [phone]

## 2012-06-28 NOTE — Progress Notes (Signed)
Subjective: Gerald Tucker is s/p BKA 4/4.  HD yesterday.  Resting comfortably in bed this morning. He has no complaints. No fever, chills, N, V.    Objective: Vital signs in last 24 hours: Filed Vitals:   06/27/12 1224 06/27/12 1648 06/27/12 2058 06/28/12 0540  BP: 111/60 90/41 96/53  121/62  Pulse: 54 92 88 83  Temp: 98.6 F (37 C) 98.2 F (36.8 C) 98.8 F (37.1 C) 98.3 F (36.8 C)  TempSrc:   Oral Oral  Resp: 16 16 18 20   Height:      Weight:      SpO2: 98% 98% 98% 98%   Weight change: -2 lb 10.4 oz (-1.201 kg)  Intake/Output Summary (Last 24 hours) at 06/28/12 0721 Last data filed at 06/27/12 2120  Gross per 24 hour  Intake    240 ml  Output   1747 ml  Net  -1507 ml    Physical Exam Blood pressure 121/62, pulse 83, temperature 98.3 F (36.8 C), temperature source Oral, resp. rate 20, height 5\' 8"  (1.727 m), weight 144 lb 10 oz (65.6 kg), SpO2 98.00%. General: No acute distress, alert and oriented x 3, appears stated age  HEENT: PERRL, EOMI, moist mucous membranes  Cardiovascular: Irregular rhythm, soft blowing systolic murmur  Respiratory: Clear to auscultation bilaterally, no wheezes, rales, or rhonchi  Abdomen: Soft, nondistended, nontender, bowel sounds present  GU: condom cath in place Extremities: Left upper extremity with aVF. Left lower extremity s/p BKA, stump wrapped. RLE with dry gangrene over R 4th toe and blister as well as black eschar over R great toe.  Neuro: Not anxious appearing, no depressed mood, normal affect.    Lab Results: Basic Metabolic Panel:  Recent Labs Lab 06/24/12 1416  06/26/12 0520 06/27/12 0710  NA 141  142  < > 135 139  K 4.3  4.3  < > 5.4* 3.1*  CL 102  103  < > 98 98  CO2 24  23  < > 26 27  GLUCOSE 73  73  < > 115* 87  BUN 46*  46*  < > 39* 51*  CREATININE 6.51*  6.42*  < > 5.41* 7.04*  CALCIUM 8.8  8.7  < > 8.9 8.6  PHOS 4.8*  --   --  4.8*  < > = values in this interval not displayed. Liver Function  Tests:  Recent Labs Lab 06/24/12 1416 06/27/12 0710  ALBUMIN 1.9* 1.8*   CBC:  Recent Labs Lab 06/21/12 1329  06/26/12 0520 06/27/12 0710  WBC 17.9*  < > 16.6* 16.1*  NEUTROABS 15.6*  --   --   --   HGB 9.4*  < > 9.6* 8.7*  HCT 28.5*  < > 28.3* 26.1*  MCV 97.9  < > 97.9 97.4  PLT 382  < > 128* 105*  < > = values in this interval not displayed. Studies/Results: No results found. Medications:  Medications reviewed  Scheduled Meds: . alteplase  4 mg Intracatheter Once  . aspirin  81 mg Oral Daily  . calcium acetate  667 mg Oral TID WC  . darbepoetin (ARANESP) injection - DIALYSIS  40 mcg Intravenous Q Wed-HD  . enoxaparin (LOVENOX) injection  30 mg Subcutaneous QHS  . feeding supplement (NEPRO CARB STEADY)  237 mL Oral BID BM  . ferric gluconate (FERRLECIT/NULECIT) IV  125 mg Intravenous Q M,W,F-HD  . metoprolol  50 mg Oral BID  . multivitamin  1 tablet Oral QHS  . pantoprazole  40 mg Oral Q1200  . polyethylene glycol  17 g Oral Daily  . senna  1 tablet Oral BID  . simvastatin  20 mg Oral QHS  . sodium chloride  3 mL Intravenous Q12H   Continuous Infusions:   PRN Meds:.sodium chloride, acetaminophen, diphenhydrAMINE, fentaNYL, ondansetron, sodium chloride  Assessment/Plan:  Gerald. Tucker is a 79 year old gentleman with a history of ESRD on HD (MWF), atrial fibrillation, and peripheral vascular disease with recent amputation of gangrenous left great toe one week ago. He presents directly from his surgeon's office where he was seen for a followup visit and directed to come to the ED for evaluation of pain, swelling and purulent discharge from his left foot wound.   PVD with Nonhealing left foot wound  S/P L BKA POD 4 (on June 24, 2012): Patient continues to do well with minimal pain and numbness and some limb pain. Plan for SNF discharge when bed is available. Well healing stump, warm, well-perfused. Infection eradicated by removal of infected limb.  -to SNF, hopefully  Wednesday after HD if bed available -limb guard device -follow up with vascular surgery in 4 weeks  Dry gangrene on R foot Did have x-ray without obvious osteomyelitis however may need MRI as out-patient to definitively rule out. Patient has severe peripheral vascular disease and will need close monitoring of his right foot. Discussed this with the family.  -Patient will followup as an outpatient, doubt he will be able to tolerate MRI at this time  Agitation and Delirium, resolved Patient with known sensitivities to narcotics and prone to hospital delirium, not new per family.  ESRD on HD (MWF)   HD tomorrow before dc -Continue PhosLo  -follow renal function panel  Atrial fibrillation without RVR  In Afib without RVR on admission. Patient is only on aspirin at home. Chads 2 score of 2.  -Continue aspirin, metoprolol   Chronic back pain He has struggled for >20 years with chronic back pain. -dc fentanyl   Constipation  -cont senna with PRN miralax  -encourage oral hydration  Hypertension -  stable -Hold home imdur -Continue metoprolol, hold in AM on HD days  DVT  -Lovenox, renally dosed  FEN  -Normal saline lock  -Renal diet   Dispo  Anticipate d/c tomorrow to SNF, waiting for SNF selection and bed Patient does have a PCP and will not be needing OPC f/u    LOS: 7 days   Denton Ar 06/28/2012, 7:21 AM

## 2012-06-28 NOTE — Progress Notes (Signed)
Subjective:  Eating breakfast, no complaints, denies any pain.  Objective: Vital signs in last 24 hours: Temp:  [97.4 F (36.3 C)-98.8 F (37.1 C)] 98.3 F (36.8 C) (04/08 0540) Pulse Rate:  [54-106] 83 (04/08 0540) Resp:  [16-20] 20 (04/08 0540) BP: (90-140)/(41-73) 121/62 mmHg (04/08 0540) SpO2:  [95 %-98 %] 98 % (04/08 0540) Weight:  [65.6 kg (144 lb 10 oz)] 65.6 kg (144 lb 10 oz) (04/07 1107) Weight change: -1.201 kg (-2 lb 10.4 oz)  Intake/Output from previous day: 04/07 0701 - 04/08 0700 In: 240 [P.O.:240] Out: 1747    EXAM: General appearance:  Alert, in no apparent distress Resp:  CTA without rales, rhonchi, or wheezes Cardio:  RRR with Gr II/VI systolic murmur, no rub GI:  + BS, soft and nontender Extremities:  Left BKA wrapped, no edema on right (eschar on 4th toe) Access:  Left IJ catheter  Lab Results:  Recent Labs  06/26/12 0520 06/27/12 0710  WBC 16.6* 16.1*  HGB 9.6* 8.7*  HCT 28.3* 26.1*  PLT 128* 105*   BMET:  Recent Labs  06/26/12 0520 06/27/12 0710  NA 135 139  K 5.4* 3.1*  CL 98 98  CO2 26 27  GLUCOSE 115* 87  BUN 39* 51*  CREATININE 5.41* 7.04*  CALCIUM 8.9 8.6  ALBUMIN  --  1.8*   No results found for this basename: PTH,  in the last 72 hours Iron Studies: No results found for this basename: IRON, TIBC, TRANSFERRIN, FERRITIN,  in the last 72 hours  Dialysis Orders: Center: [place] on MWF .  EDW 72 kg HD Bath 2K/ 2Ca Time 3:45 Heparin 3000u bolus/ 2000 u mid. Access Left IJ BFR 400 DFR A1,5  Hectorol 0 mcg IV/HD Epogen 1800 Units IV/HD Venofer 100 mg IV through 4/11/  Assessment/Plan: 1. Left foot osteomyelitis - s/p left BKA 4/4 by Dr. Arbie Cookey, Vancomycin/Fortaz started 4/4, now dc'd.. 2. ESRD - HD on MWF @ [place]; pre-HD K 3.1 yesterday, used 4K bath.  Next HD tomorrow. 3. HTN/Volume - BP 121/62 on Lopressor 50 mg bid; current wt 65.6 kg s/p net UF 1.7 L yesterday, previous EDW 72. Establish new EDW. 4. Anemia - Hgb 8.7, Aranesp 40 mcg  on Wed. 5. Secondary hyperparathyroidism - Ca 8.6 (10.2 corrected), P 4.8; Phoslo 1 with meals. 6. Nutrition - Alb 1.8, renal diet, vitamin, Nepro. 7. Right foot eschar - no osteomyelitis per x-ray 4/3.     LOS: 7 days   Gerald Tucker,Gerald Tucker 06/28/2012,8:20 AM  Patient seen and examined.  Agree with assessment and plan as above. Vinson Moselle  MD [phone] pgr    [phone] cell 06/28/2012, 1:26 PM

## 2012-06-28 NOTE — Progress Notes (Signed)
Utilization review completed.  

## 2012-06-28 NOTE — Progress Notes (Signed)
Patient ID: Gerald Tucker, male   DOB: [birth year], 79 y.o.   MRN: [id number] Comfortable with minimal difficulty on his left stump. He reports some mild discomfort only. His right foot remained stable with dry gangrene on the dorsum of the fourth toe. No evidence of infection.  Multiple questions regarding his ability for prostatic. I explained this would be the several months and that he is a distant history 2 generalized weakness in his right leg. The focus initially we'll transfer from bed to chair. Facility is ready for skilled nursing facility transfer when bed is available

## 2012-06-29 LAB — RENAL FUNCTION PANEL
Albumin: 1.9 g/dL — ABNORMAL LOW (ref 3.5–5.2)
Calcium: 8.7 mg/dL (ref 8.4–10.5)
GFR calc Af Amer: 9 mL/min — ABNORMAL LOW (ref >90)
Phosphorus: 3.7 mg/dL (ref 2.3–4.6)
Potassium: 3 mEq/L — ABNORMAL LOW (ref 3.5–5.1)
Sodium: 138 mEq/L (ref 135–145)

## 2012-06-29 LAB — CBC
MCHC: 34 g/dL (ref 30.0–36.0)
RDW: 15.5 % (ref 11.5–15.5)

## 2012-06-29 MED ORDER — SODIUM CHLORIDE 0.9 % IV SOLN: 100.0000 mL | INTRAVENOUS | Status: DC | PRN

## 2012-06-29 MED ORDER — NEPRO/CARBSTEADY PO LIQD: 237.0000 mL | ORAL | Status: DC | PRN

## 2012-06-29 MED ORDER — LIDOCAINE-PRILOCAINE 2.5-2.5 % EX CREA: 1.0000 "application " | TOPICAL_CREAM | CUTANEOUS | Status: DC | PRN

## 2012-06-29 MED ORDER — DARBEPOETIN ALFA-POLYSORBATE 40 MCG/0.4ML IJ SOLN
INTRAMUSCULAR | Status: AC
  Administered 2012-06-29: 40 ug via INTRAVENOUS
  Filled 2012-06-29: qty 0.4

## 2012-06-29 MED ORDER — HEPARIN SODIUM (PORCINE) 1000 UNIT/ML DIALYSIS: 1000.0000 [IU] | INTRAMUSCULAR | Status: DC | PRN

## 2012-06-29 MED ORDER — ALTEPLASE 2 MG IJ SOLR
2.0000 mg | Freq: Once | INTRAMUSCULAR | Status: DC | PRN
  Filled 2012-06-29: qty 2

## 2012-06-29 MED ORDER — LIDOCAINE HCL (PF) 1 % IJ SOLN: 5.0000 mL | INTRAMUSCULAR | Status: DC | PRN

## 2012-06-29 MED ORDER — PENTAFLUOROPROP-TETRAFLUOROETH EX AERO: 1.0000 "application " | INHALATION_SPRAY | CUTANEOUS | Status: DC | PRN

## 2012-06-29 NOTE — Progress Notes (Signed)
Internal Medicine Teaching Service Attending Note Date: 06/29/2012  Patient name: Gerald Tucker  Medical record number: [id number]  Date of birth: Dec 17, 1918    This patient has been seen and discussed with the house staff. Please see their note for complete details. I concur with their findings with the following additions/corrections: Plan to discharge home today.  Lars Mage 06/29/2012, 2:09 PM

## 2012-06-29 NOTE — Discharge Summary (Signed)
Internal Medicine Teaching [facility] Discharge Note  Name: Gerald Tucker MRN: [id number] DOB: October 19, 1918 79 y.o.  Date of Admission: 06/21/2012 12:59 PM Date of Discharge: 06/29/2012 Attending Physician: Lars Mage, MD  Discharge Diagnosis: Principal Problem:   Toe gangrene Active Problems:   Hypertension   ESRD on dialysis   Constipation - functional   Atrial fibrillation Left BKA  Discharge Medications:   Medication List    STOP taking these medications       clindamycin 150 MG capsule  Commonly known as:  CLEOCIN     DIALYVITE PO     traMADol 50 MG tablet  Commonly known as:  ULTRAM      TAKE these medications       acetaminophen 325 MG tablet  Commonly known as:  TYLENOL  Take 650 mg by mouth every 6 (six) hours as needed. For pain     aspirin 81 MG tablet  Take 81 mg by mouth daily.     calcium acetate 667 MG capsule  Commonly known as:  PHOSLO  Take 667 mg by mouth 3 (three) times daily with meals.     isosorbide mononitrate 60 MG 24 hr tablet  Commonly known as:  IMDUR  Take 60 mg by mouth daily.     LIDODERM 5 %  Generic drug:  lidocaine  Place 1 patch onto the skin daily. One patch once or twice a week     metoprolol 50 MG tablet  Commonly known as:  LOPRESSOR  Take 50 mg by mouth 2 (two) times daily.     nitroGLYCERIN 0.4 MG SL tablet  Commonly known as:  NITROSTAT  Place 1 tablet (0.4 mg total) under the tongue every 5 (five) minutes as needed.     nystatin ointment  Commonly known as:  MYCOSTATIN  Apply 1 application topically 2 (two) times daily.     omeprazole 20 MG capsule  Commonly known as:  PRILOSEC  Take 20 mg by mouth 2 (two) times daily.     polyethylene glycol packet  Commonly known as:  MIRALAX / GLYCOLAX  Take 17 g by mouth daily.     simvastatin 20 MG tablet  Commonly known as:  ZOCOR  Take 20 mg by mouth at bedtime.        Disposition and follow-up:   Gerald Tucker was discharged from [facility] in Stable condition.  At the hospital follow up visit please address the following  -follow up with vascular surgery in 3-4 weeks -Followup hemodialysis -Patient may need outpatient MRI to evaluate dry gangrene and other foot  Follow-up Appointments: Follow-up Information   Follow up with EARLY, TODD, MD In 4 weeks. (Office will call you to arrange your appt (sent))    Contact information:   [address] [place] [address] [phone]       Future Appointments Provider Department Dept Phone   07/26/2012 12:15 PM Larina Earthly, MD [facility] [phone]      Consultations: Treatment Team:  Dyke Maes, MD Larina Earthly, MD  Procedures Performed:  Dg Knee Complete 4 Views Left  06/21/2012  *RADIOLOGY REPORT*  Clinical Data: Fall, medial knee pain  LEFT KNEE - COMPLETE 4+ VIEW  Comparison: 10/03/2009  Findings: Moderate to severe tricompartmental degenerative changes, most prominent in the patellofemoral compartment.  No evidence of acute fracture or dislocation.  Stable fragmentation of the osteophyte along the medial tibial plateau.  Possible small  suprapatellar knee joint effusion.  Extensive vascular calcifications.  IMPRESSION: No evidence acute fracture or dislocation.  Moderate to severe tricompartmental degenerative changes.   Original Report Authenticated By: Charline Bills, M.D.    Dg Foot Complete Left  06/21/2012  *RADIOLOGY REPORT*  Clinical Data: Left foot pain, recent surgical operation, evaluate for infection  LEFT FOOT - COMPLETE 3+ VIEW  Comparison: 06/17/2012  Findings:  Stable sequela of amputation of the distal aspect of the first metatarsal.  Interval removal of overlying surgical drain. Irregular lucency about the operative site is favored to represent asymmetric residual soft tissue.  Query minimal amount of osteolysis involving the distal end of the residual first metatarsal, best appreciated on the oblique lateral  radiograph.  No definite fracture or dislocation.  Multiple hammer toe deformities are redemonstrated.  Vascular calcifications.  Rather diffuse soft tissue swelling about the lower leg and foot.  IMPRESSION: Query minimal amount of osteolysis/osteomyelitis involving the residual distal tip of the first metatarsal.  Further evaluation with MRI may be performed as clinically indicated.   Original Report Authenticated By: Tacey Ruiz, MD    Dg Foot Complete Right  06/23/2012  *RADIOLOGY REPORT*  Clinical Data: Possible osteomyelitis.  RIGHT FOOT COMPLETE - 3+ VIEW  Comparison: February 16, 2012.  Findings: No fracture or dislocation is noted.  Arterial calcifications are noted suggesting diabetes.  Mild spurring of posterior calcaneus is noted.  No lytic destruction is seen to suggest osteomyelitis.  Mild degenerative changes seen involving the first interphalangeal joint as well as the fifth metatarsophalangeal joint.  IMPRESSION: No lytic destruction is seen to suggest osteomyelitis.   Original Report Authenticated By: Lupita Raider.,  M.D.      Admission HPI:  Gerald Tucker is a 79 year old gentleman with a history of ESRD on HD (MWF), atrial fibrillation, and peripheral vascular disease with recent amputation of gangrenous left great toe one week ago. He presents directly from his surgeon's office where he was seen for a followup visit and directed to come to the ED for evaluation of pain, swelling and purulent discharge from his left foot wound.  Gerald Tucker presented on 06/07/2012 with worsening foot ulcers and it was recommended that he undergo a left BKA at that time. The patient wanted to go foot sparing surgery and so a partial amputation was performed. Patient did well after the surgery except for some pain which was controlled with oxycodone and tramadol. Patient's family states that he got a bit sedated with oxycodone and they switched him to tramadol on Sunday. Patient states that he has had more  severe pain, "like before the surgery," over the last 2-3 days. He denies having any fever or chills but is frequently cold at home. His son did notice a foul smell coming from his foot on Sunday. At his second postop visit the day of presentation, he was noted to have increased swelling and purulent drainage from the wound. According to patient's son, the wound was opened in the office and redressed with gauze. The patient was sent to the ED from the office for further evaluation and treatment.  At baseline, the patient is alert and oriented, conversant, and lives with his daughter. He does require help with dressing, cooking, bathing, and mobility.  Patient's son is his health care power of attorney who has a copy of his living will.  He has been taking PO clindamycin at home both before and after the surgery.  ROS + for constipation.  Hospital Course by problem list: Principal Problem:   Toe gangrene Active Problems:   Hypertension   ESRD on dialysis   Constipation - functional   Atrial fibrillation    Peripheral vascular disease with nonhealing left foot wound Patient with hx of severe PVD presented one week after left great toe amputation with fever, purulent drainage and worsening pain from the operative wound. Initial labs revealed elevated white count. Patient was resistant to getting a BKA at first (though that was the initial recommendation from surgeon). Vascular surgery evaluated the patient and he was started on IV antibiotics. Patient continued to have worsening pain in his left foot and he was eventually taken for a left BKA on 06/24/2012 after both patient and family agreed to the surgery. No complications. He was continued on vancomycin and ceftaz  for 2 days after surgery. He did well postoperatively and his white count decreased. On the day of discharge, his pain is well-controlled and he has a plan for followup with vascular surgery. He'll be going to a SNF for further  recovery. No further antibiotics needed.  Dry gangrene - right toes On admission, patient did have chronic wounds from PVD on his right foot. He did appear to develop a new eschar over his right great toe during his hospital stay. X-ray did not show any definitive osteomyelitis. However, we recommend that patient get this further evaluated as an outpatient with potential MRI.  ESRD on hemodialysis Patient receives Monday Wednesday Friday dialysis at home. This was continued during his hospitalization and he was followed by the nephrology team.  Delirium Patient did have some agitation and delirium during his hospital stay. He did require soft wrist restraints at one time since he was getting combative with staff. This resolved shortly after. Patient's family states that this happens often during hospital stays and with narcotic use as well. The day of discharge, patient is alert and oriented without any agitation.    Discharge Vitals:  BP 129/69  Pulse 85  Temp(Src) 98.6 F (37 C) (Oral)  Resp 18  Ht 5\' 8"  (1.727 m)  Wt 144 lb 10 oz (65.6 kg)  BMI 21.99 kg/m2  SpO2 99%  Discharge Labs:  Results for orders placed during the hospital encounter of 06/21/12 (from the past 24 hour(s))  CBC     Status: Abnormal   Collection Time    06/29/12  6:45 AM      Result Value Range   WBC 15.7 (*) 4.0 - 10.5 K/uL   RBC 2.63 (*) 4.22 - 5.81 MIL/uL   Hemoglobin 8.7 (*) 13.0 - 17.0 g/dL   HCT 40.9 (*) 81.1 - 91.4 %   MCV 97.3  78.0 - 100.0 fL   MCH 33.1  26.0 - 34.0 pg   MCHC 34.0  30.0 - 36.0 g/dL   RDW 78.2  95.6 - 21.3 %   Platelets 96 (*) 150 - 400 K/uL  RENAL FUNCTION PANEL     Status: Abnormal   Collection Time    06/29/12  6:45 AM      Result Value Range   Sodium 138  135 - 145 mEq/L   Potassium 3.0 (*) 3.5 - 5.1 mEq/L   Chloride 99  96 - 112 mEq/L   CO2 27  19 - 32 mEq/L   Glucose, Bld 127 (*) 70 - 99 mg/dL   BUN 42 (*) 6 - 23 mg/dL   Creatinine, Ser 0.86 (*) 0.50 - 1.35 mg/dL    Calcium  8.7  8.4 - 10.5 mg/dL   Phosphorus 3.7  2.3 - 4.6 mg/dL   Albumin 1.9 (*) 3.5 - 5.2 g/dL   GFR calc non Af Amer 8 (*) >90 mL/min   GFR calc Af Amer 9 (*) >90 mL/min    Signed: Denton Ar 06/29/2012, 1:59 PM   Time Spent on Discharge: 35 min Services Ordered on Discharge: none Equipment Ordered on Discharge: none

## 2012-06-29 NOTE — Progress Notes (Signed)
Subjective: Pt resting in dialysis today, no complaints. No events overnight.  Called family to discuss plans for discharge and waiting to hear back from pt's son Gerald Tucker, who is the main decision maker, about SNF placement.   Objective: Vital signs in last 24 hours: Filed Vitals:   06/29/12 0642 06/29/12 0700 06/29/12 0730 06/29/12 0800  BP: 137/63 134/56 118/67 127/64  Pulse: 82 81 83 77  Temp:      TempSrc:      Resp:      Height:      Weight:      SpO2:       Weight change:   Intake/Output Summary (Last 24 hours) at 06/29/12 0832 Last data filed at 06/28/12 1821  Gross per 24 hour  Intake    630 ml  Output      0 ml  Net    630 ml    Physical Exam Blood pressure 127/64, pulse 77, temperature 98.6 F (37 C), temperature source Oral, resp. rate 18, height 5\' 8"  (1.727 m), weight 144 lb 10 oz (65.6 kg), SpO2 97.00%. General: No acute distress, alert and oriented x 3, appears stated age  HEENT: PERRL, EOMI, moist mucous membranes  Cardiovascular: Irregular rhythm, systolic murmur noted Respiratory: Clear to auscultation bilaterally, no wheezes, rales, or rhonchi  Abdomen: Soft, nondistended, nontender, bowel sounds present  Extremities: Left lower extremity s/p BKA, stump wrapped. RLE with dry gangrene over R 4th toe and blister as well as black eschar over R great toe.  Neuro: Not anxious appearing, no depressed mood, normal affect.    Lab Results: Basic Metabolic Panel:  Recent Labs Lab 06/27/12 0710 06/29/12 0645  NA 139 138  K 3.1* 3.0*  CL 98 99  CO2 27 27  GLUCOSE 87 127*  BUN 51* 42*  CREATININE 7.04* 5.74*  CALCIUM 8.6 8.7  PHOS 4.8* 3.7   Liver Function Tests:  Recent Labs Lab 06/27/12 0710 06/29/12 0645  ALBUMIN 1.8* 1.9*   CBC:  Recent Labs Lab 06/27/12 0710 06/29/12 0645  WBC 16.1* 15.7*  HGB 8.7* 8.7*  HCT 26.1* 25.6*  MCV 97.4 97.3  PLT 105* 96*   Medications:  Medications reviewed  Scheduled Meds: . alteplase  4 mg  Intracatheter Once  . aspirin  81 mg Oral Daily  . calcium acetate  667 mg Oral TID WC  . darbepoetin (ARANESP) injection - DIALYSIS  40 mcg Intravenous Q Wed-HD  . enoxaparin (LOVENOX) injection  30 mg Subcutaneous QHS  . feeding supplement (NEPRO CARB STEADY)  237 mL Oral BID BM  . ferric gluconate (FERRLECIT/NULECIT) IV  125 mg Intravenous Q M,W,F-HD  . metoprolol  50 mg Oral BID  . multivitamin  1 tablet Oral QHS  . pantoprazole  40 mg Oral Q1200  . senna  1 tablet Oral BID  . simvastatin  20 mg Oral QHS  . sodium chloride  3 mL Intravenous Q12H   Continuous Infusions:   PRN Meds:.sodium chloride, sodium chloride, sodium chloride, acetaminophen, alteplase, diphenhydrAMINE, feeding supplement (NEPRO CARB STEADY), fentaNYL, heparin, lidocaine (PF), lidocaine-prilocaine, ondansetron, pentafluoroprop-tetrafluoroeth, sodium chloride  Assessment/Plan:  [name] is a 79 year old gentleman with a history of ESRD on HD (MWF), atrial fibrillation, and peripheral vascular disease with recent amputation of gangrenous left great toe one week ago. He presents directly from his surgeon's office where he was seen for a followup visit and directed to come to the ED for evaluation of pain, swelling and purulent discharge from his left  foot wound.   PVD with Nonhealing left foot wound  S/P L BKA POD 5 (on June 24, 2012): Patient continues to do well with minimal pain and numbness and some limb pain. Plan for SNF discharge when bed is available. Well healing stump, warm, well-perfused. Infection eradicated by removal of infected limb.  -to SNF, will discuss with family and get update from social work today -limb guard device ordered -follow up with vascular surgery in 4 weeks  Dry gangrene on R foot Did have x-ray without obvious osteomyelitis however may need MRI as out-patient to definitively rule out. Patient has severe peripheral vascular disease and will need close monitoring of his right foot.  Discussed this with the family.  -Patient will followup as an outpatient, doubt he will be able to tolerate MRI at this time  Agitation and Delirium, resolved Patient with known sensitivities to narcotics and prone to hospital delirium, not new per family.  ESRD on HD (MWF)   HD today -Continue PhosLo  -follow renal function panel  Atrial fibrillation without RVR  In Afib without RVR on admission. Patient is only on aspirin at home. Chads 2 score of 2.  -Continue aspirin, metoprolol   Constipation  -cont senna with PRN miralax  -encourage oral hydration  Hypertension -  stable -Hold home imdur -Continue metoprolol, hold in AM on HD days  DVT  -Lovenox, renally dosed  FEN  -Normal saline lock  -Renal diet   Dispo  Anticipate dc to SNF when available, waiting for SNF selection and bed opening Patient does have a PCP and will not be needing OPC f/u    LOS: 8 days   Denton Ar 06/29/2012, 8:32 AM

## 2012-06-29 NOTE — Progress Notes (Signed)
Report called to Galen Daft, nurse at [facility]. Pt remains stable. IV removed and paper gown placed. Pt ready for discharge. Pt awaiting EMS transport to [facility]. Gerald Tucker, Gerald Tucker Randy

## 2012-06-29 NOTE — Progress Notes (Signed)
Subjective:  Seen on dialysis, no complaints, denies pain.  Objective: Vital signs in last 24 hours: Temp:  [98 F (36.7 C)-98.9 F (37.2 C)] 98.6 F (37 C) (04/09 0554) Pulse Rate:  [74-89] 81 (04/09 0700) Resp:  [18] 18 (04/09 0554) BP: (117-137)/(56-79) 134/56 mmHg (04/09 0700) SpO2:  [97 %-100 %] 97 % (04/09 0554) Weight change:   Intake/Output from previous day: 04/08 0701 - 04/09 0700 In: 630 [P.O.:630] Out: -    EXAM: General appearance:  Alert, in no apparent distress Resp:  CTA without rales, rhonchi, or wheezes Cardio:  RRR with Gr II/VI systolic murmur, no rub GI:  + BS, soft and nontender Extremities:  Left BKA wrapped, no edema on right (eschar on 4th toe, also big toe) Access: Left IJ catheter with BFR 300 cc/min  Lab Results:  Recent Labs  06/27/12 0710 06/29/12 0645  WBC 16.1* 15.7*  HGB 8.7* 8.7*  HCT 26.1* 25.6*  PLT 105* 96*   BMET:  Recent Labs  06/27/12 0710 06/29/12 0645  NA 139 138  K 3.1* 3.0*  CL 98 99  CO2 27 27  GLUCOSE 87 127*  BUN 51* 42*  CREATININE 7.04* 5.74*  CALCIUM 8.6 8.7  ALBUMIN 1.8* 1.9*   No results found for this basename: PTH,  in the last 72 hours Iron Studies: No results found for this basename: IRON, TIBC, TRANSFERRIN, FERRITIN,  in the last 72 hours  Dialysis Orders: Center: [place] on MWF .  EDW 72 kg HD Bath 2K/ 2Ca Time 3:45 Heparin 3000u bolus/ 2000 u mid. Access Left IJ BFR 400 DFR A1,5 Hectorol 0 mcg IV/HD Epogen 1800 Units IV/HD Venofer 100 mg IV through 4/11/  Assessment/Plan: 1. Left foot osteomyelitis - s/p left BKA 4/4 by Dr. Arbie Cookey, Vancomycin/Fortaz started 4/4, now dc'd. 2. ESRD - HD on MWF @ [facility]; pre-HD K 3, used 4K bath. HD today.  3. HTN/Volume - BP 136/56 on Lopressor 50 mg bid; current wt 65.6, previous EDW 72. UF goal of 2 L, establish new EDW.  4. Anemia - Hgb 8.7, Aranesp 40 mcg on Wed.  5. Secondary hyperparathyroidism - Ca 8.7 (10.4 corrected), P 3.7; Phoslo 1 with meals.  6. Nutrition  - Alb 1.9, renal diet, vitamin, Nepro.  7. Right foot eschar - no osteomyelitis per x-ray 4/3     LOS: 8 days   LYLES,CHARLES 06/29/2012,7:46 AM  Patient seen and examined.  Agree with assessment and plan as above. Vinson Moselle  MD [phone] pgr    [phone] 06/29/2012, 1:03 PM

## 2012-07-07 ENCOUNTER — Non-Acute Institutional Stay (SKILLED_NURSING_FACILITY): Payer: Medicare Other | Admitting: Internal Medicine

## 2012-07-07 DIAGNOSIS — K5900 Constipation, unspecified: Secondary | ICD-10-CM

## 2012-07-07 DIAGNOSIS — S88112D Complete traumatic amputation at level between knee and ankle, left lower leg, subsequent encounter: Secondary | ICD-10-CM

## 2012-07-07 DIAGNOSIS — M549 Dorsalgia, unspecified: Secondary | ICD-10-CM

## 2012-07-07 DIAGNOSIS — N186 End stage renal disease: Secondary | ICD-10-CM

## 2012-07-07 DIAGNOSIS — I96 Gangrene, not elsewhere classified: Secondary | ICD-10-CM

## 2012-07-07 DIAGNOSIS — Z5189 Encounter for other specified aftercare: Secondary | ICD-10-CM

## 2012-07-07 DIAGNOSIS — Z992 Dependence on renal dialysis: Secondary | ICD-10-CM

## 2012-07-07 DIAGNOSIS — I4891 Unspecified atrial fibrillation: Secondary | ICD-10-CM

## 2012-07-07 DIAGNOSIS — I10 Essential (primary) hypertension: Secondary | ICD-10-CM

## 2012-07-07 NOTE — Progress Notes (Signed)
Patient ID: Gerald Tucker, male   DOB: Feb 19, 1919, 79 y.o.   MRN: [id number]    PCP: Cecille Aver, MD  Code Status:full code  Allergies  Allergen Reactions  . Penicillins Rash    "felt like on fire"  . Morphine And Related     Agitated and sees things  . Amoxicillin     Pt reported unknown reaction  . Darvocet (Propoxyphene-Acetaminophen)     Agitated and sees things  . Keflex (Cephalexin) Other (See Comments)    Pt reported unknown reaction  . Neurontin (Gabapentin)     Agitated and confused  . Ultram (Tramadol Hcl)     Agitated and sees things. Can take small amounts  . Vancomycin Itching    Chief Complaint: new admit post hospitalization 06/21/12- 06/29/12  HPI:  Gerald Tucker is a 79 year old gentleman with a history of ESRD on HD (MWF), atrial fibrillation, and peripheral vascular disease with amputation of gangrenous left great toe a week prior to admission was admitted to hospital from surgeon's clinic for pain with swelling and purulent drainage from the wound on left foot. He underwent left BKA this admission.He was continued on vancomycin and ceftaz  for 2 days after surgery. He did well postoperatively and his white count decreased. He was also noted to have chronic wounds from PVD on his right foot. He did appear to develop a new eschar over his right great toe during his hospital stay. X-ray did not show any definitive osteomyelitis. He was discharged to SNF to undergo rehab. He was seen in his room today. He has been complaining of low back pain intermittently. He says it feels like sharp pain, non radiating in nature and denies numbness or tingling.  Review of Systems:  Review of Systems  Constitutional: Negative for fever, chills, malaise/fatigue and diaphoresis.  Eyes: Negative for blurred vision.  Respiratory: Negative for sputum production and shortness of breath.   Cardiovascular: Negative for chest pain and palpitations.  Gastrointestinal: Negative for  heartburn, nausea, vomiting and abdominal pain.  Genitourinary: Negative for dysuria.  Musculoskeletal: Positive for back pain. Negative for falls.  Skin: Negative for rash.  Neurological: Negative for dizziness, focal weakness, weakness and headaches.  Psychiatric/Behavioral: Negative for depression.    Past Medical History  Diagnosis Date  . Hypertension   . Ulcer   . GERD (gastroesophageal reflux disease)   . CAD (coronary artery disease)     distal LAD occlusion  . Poor circulation   . ESRD on dialysis   . Hyperlipidemia   . Cancer     Skin  . Knee pain, right   . OSA (obstructive sleep apnea)   . PAD (peripheral artery disease)   . IDA (iron deficiency anemia)   . Hyperparathyroidism   . Arthritis   . Atrial fibrillation   . Aortic stenosis    Past Surgical History  Procedure Laterality Date  . Back surgery    . Neck surgery    . Hernia repair      right  . Knee surgery    . Leg surgery    . Shoulder surgery    . Cardiac catheterization  02/06/2005    EF 65-70%.  . Av fistula placement      LEFT ARM  . Cataract extraction, bilateral    . Cholecystectomy    . Transthoracic echocardiogram  04/04/2009    EF 60-65%  . Transthoracic echocardiogram  02/07/2008    EF 55-60%  . Cardiovascular stress test  06/01/2006    EF 55%. NORMAL LV FUNCTION  . Amputation Left 06/24/2012    Procedure: LEFT BELOW KNEE AMPUTATION;  Surgeon: Larina Earthly, MD;  Location: [facility] OR;  Service: Vascular;  Laterality: Left;   Social History:   reports that he has never smoked. He has never used smokeless tobacco. He reports that he does not drink alcohol or use illicit drugs.  Family History  Problem Relation Age of Onset  . Stroke Mother   . Heart attack Father   . Stroke Brother   . Stroke Brother   . Stroke Brother     Medications: Patient's Medications  New Prescriptions   No medications on file  Previous Medications   ACETAMINOPHEN (TYLENOL) 325 MG TABLET    Take 650 mg by  mouth every 6 (six) hours as needed. For pain   ASPIRIN 81 MG TABLET    Take 81 mg by mouth daily.     CALCIUM ACETATE (PHOSLO) 667 MG CAPSULE    Take 667 mg by mouth 3 (three) times daily with meals.    ISOSORBIDE MONONITRATE (IMDUR) 60 MG 24 HR TABLET    Take 60 mg by mouth daily.     LIDODERM 5 %    Place 1 patch onto the skin daily. One patch once or twice a week   METOPROLOL (LOPRESSOR) 50 MG TABLET    Take 50 mg by mouth 2 (two) times daily.    NITROGLYCERIN (NITROSTAT) 0.4 MG SL TABLET    Place 1 tablet (0.4 mg total) under the tongue every 5 (five) minutes as needed.   NYSTATIN OINTMENT (MYCOSTATIN)    Apply 1 application topically 2 (two) times daily.   OMEPRAZOLE (PRILOSEC) 20 MG CAPSULE    Take 20 mg by mouth 2 (two) times daily.     POLYETHYLENE GLYCOL (MIRALAX / GLYCOLAX) PACKET    Take 17 g by mouth daily.   SIMVASTATIN (ZOCOR) 20 MG TABLET    Take 20 mg by mouth at bedtime.    Modified Medications   No medications on file  Discontinued Medications   No medications on file   Physical Exam:  Filed Vitals:   07/07/12 1520  BP: 118/70  Pulse: 84  Temp: 97 F (36.1 C)  Resp: 16  SpO2: 96%   Physical Exam  Constitutional: He is oriented to person, place, and time.  Pleasant, elderly male in no distress  HENT:  Head: Normocephalic and atraumatic.  Mouth/Throat: Oropharynx is clear and moist.  Eyes: Conjunctivae are normal. Pupils are equal, round, and reactive to light.  Neck: Normal range of motion. Neck supple. No JVD present.  Cardiovascular: Normal rate and regular rhythm.   Pulmonary/Chest: Effort normal and breath sounds normal.  Abdominal: Soft. Bowel sounds are normal.  Musculoskeletal: Normal range of motion. He exhibits no edema.  Has lower lumbar para-spinal tenderness. No spinal tenderness. No step off sign. Right 3rd toe dry gangrene with no drainage. Left BKA  Lymphadenopathy:    He has no cervical adenopathy.  Neurological: He is alert and oriented to  person, place, and time.  Skin: Skin is warm and dry. He is not diaphoretic.  Psychiatric: He has a normal mood and affect.      Labs reviewed: Basic Metabolic Panel:  Recent Labs  47/82/95 1416  06/26/12 0520 06/27/12 0710 06/29/12 0645  NA 141  142  < > 135 139 138  K 4.3  4.3  < > 5.4* 3.1* 3.0*  CL 102  103  < > 98 98 99  CO2 24  23  < > 26 27 27   GLUCOSE 73  73  < > 115* 87 127*  BUN 46*  46*  < > 39* 51* 42*  CREATININE 6.51*  6.42*  < > 5.41* 7.04* 5.74*  CALCIUM 8.8  8.7  < > 8.9 8.6 8.7  PHOS 4.8*  --   --  4.8* 3.7  < > = values in this interval not displayed. Liver Function Tests:  Recent Labs  01/01/12 1633 01/02/12 0315  06/24/12 1416 06/27/12 0710 06/29/12 0645  AST 46* 43*  --   --   --   --   ALT 31 27  --   --   --   --   ALKPHOS 73 69  --   --   --   --   BILITOT 0.6 0.5  --   --   --   --   PROT 6.5 6.1  --   --   --   --   ALBUMIN 2.9* 2.7*  < > 1.9* 1.8* 1.9*  < > = values in this interval not displayed.  Recent Labs  01/01/12 1633  LIPASE 54   No results found for this basename: AMMONIA,  in the last 8760 hours CBC:  Recent Labs  01/01/12 1633  06/21/12 1329  06/26/12 0520 06/27/12 0710 06/29/12 0645  WBC 10.9*  < > 17.9*  < > 16.6* 16.1* 15.7*  NEUTROABS 8.6*  --  15.6*  --   --   --   --   HGB 11.0*  < > 9.4*  < > 9.6* 8.7* 8.7*  HCT 33.4*  < > 28.5*  < > 28.3* 26.1* 25.6*  MCV 97.9  < > 97.9  < > 97.9 97.4 97.3  PLT 285  < > 382  < > 128* 105* 96*  < > = values in this interval not displayed. Cardiac Enzymes:  Recent Labs  01/01/12 2139 01/02/12 0315 01/02/12 0851  TROPONINI <0.30 <0.30 <0.30    Radiological Exams: Procedures Performed:  Dg Knee Complete 4 Views Left  06/21/2012  *RADIOLOGY REPORT*  Clinical Data: Fall, medial knee pain  LEFT KNEE - COMPLETE 4+ VIEW  Comparison: 10/03/2009  Findings: Moderate to severe tricompartmental degenerative changes, most prominent in the patellofemoral compartment.   No evidence of acute fracture or dislocation.  Stable fragmentation of the osteophyte along the medial tibial plateau.  Possible small suprapatellar knee joint effusion.  Extensive vascular calcifications.  IMPRESSION: No evidence acute fracture or dislocation.  Moderate to severe tricompartmental degenerative changes.   Original Report Authenticated By: Charline Bills, M.D.    Dg Foot Complete Left  06/21/2012  *RADIOLOGY REPORT*  Clinical Data: Left foot pain, recent surgical operation, evaluate for infection  LEFT FOOT - COMPLETE 3+ VIEW  Comparison: 06/17/2012  Findings:  Stable sequela of amputation of the distal aspect of the first metatarsal.  Interval removal of overlying surgical drain. Irregular lucency about the operative site is favored to represent asymmetric residual soft tissue.  Query minimal amount of osteolysis involving the distal end of the residual first metatarsal, best appreciated on the oblique lateral radiograph.  No definite fracture or dislocation.  Multiple hammer toe deformities are redemonstrated.  Vascular calcifications.  Rather diffuse soft tissue swelling about the lower leg and foot.  IMPRESSION: Query minimal amount of osteolysis/osteomyelitis involving the residual distal tip of the first metatarsal.  Further evaluation with MRI may  be performed as clinically indicated.   Original Report Authenticated By: Tacey Ruiz, MD    Dg Foot Complete Right  06/23/2012  *RADIOLOGY REPORT*  Clinical Data: Possible osteomyelitis.  RIGHT FOOT COMPLETE - 3+ VIEW  Comparison: February 16, 2012.  Findings: No fracture or dislocation is noted.  Arterial calcifications are noted suggesting diabetes.  Mild spurring of posterior calcaneus is noted.  No lytic destruction is seen to suggest osteomyelitis.  Mild degenerative changes seen involving the first interphalangeal joint as well as the fifth metatarsophalangeal joint.  IMPRESSION: No lytic destruction is seen to suggest osteomyelitis.    Original Report Authenticated By: Lupita Raider.,  M.D.     Assessment/Plan  left BKA- s/p severe PVD with gangrene. Pain under control currently- continue current regimen. Continue wound care. Will keep appointment with dr todd. Continue ASA  Right toe gangrene- monitor it, no signs of drainage/ discharge at present. Wound care to be continued  Hypertension- continue isosorbide mononitrate and b blocker  ESRD on dialysis- stable with 3/week dialysis, continue phoslo.fistula site clean  Constipation - continue miralax for bowel regimen  Back pain- on lidoderm patch. Will have gel overlay mattress for him and add oxyIR 5 mg every 8 hr as needed for pain for now. Encouraged to be OOB to chair and work with PT/OT  Atrial fibrillation- rate controlled with metoprolol 50 mg bid, continue asa and statin  Goals of care: gait training. Fall precautions. Wound care.   Labs/tests ordered: cbc, cmp

## 2012-07-11 ENCOUNTER — Telehealth: Payer: Self-pay | Admitting: *Deleted

## 2012-07-11 ENCOUNTER — Other Ambulatory Visit: Payer: Self-pay | Admitting: *Deleted

## 2012-07-11 MED ORDER — OXYCODONE HCL 5 MG PO TABS: ORAL_TABLET | ORAL | Status: AC

## 2012-07-11 NOTE — Telephone Encounter (Signed)
Daughter, Osvaldo Human, called to report that patient was living at [facility] now.

## 2012-07-18 ENCOUNTER — Telehealth: Payer: Self-pay | Admitting: Vascular Surgery

## 2012-07-18 NOTE — Telephone Encounter (Signed)
Spoke with vaniqua at [facility] [phone]. They are aware of appt on 5/6  = kf

## 2012-07-18 NOTE — Telephone Encounter (Signed)
Message copied by Margaretmary Eddy on Mon Jul 18, 2012 11:30 AM ------      Message from: Lorin Mercy K      Created: Mon Jul 11, 2012  1:07 PM      Regarding: Location of patient       THIS PATIENT IS A RESIDENT AT [facility] PER DAUGHTER Dois Davenport   [phone]                              Spoke with wife, said she would notify the rehab he's going to about appt, she will call us with which rehab he goes to and their phone number - kf                        Margaretmary Eddy at 06/28/2012  3:27 PM         Status: Signed                                  Message copied by Margaretmary Eddy on Tue Jun 28, 2012  3:27 PM      ------           Message from: Sharee Pimple           Created: Tue Jun 28, 2012  1:49 PM           Regarding: schedule                                          ----- Message -----              From: Dara Lords, PA-C              Sent: 06/25/2012  10:44 AM                To: Sharee Pimple, CMA                       S/p left BKA by Dr. Arbie Cookey 06/24/12.  F/u in 4 weeks with him.                       Thanks      ------               ------

## 2012-07-21 ENCOUNTER — Encounter: Payer: Self-pay | Admitting: Nurse Practitioner

## 2012-07-25 ENCOUNTER — Encounter: Payer: Self-pay | Admitting: Vascular Surgery

## 2012-07-26 ENCOUNTER — Ambulatory Visit (INDEPENDENT_AMBULATORY_CARE_PROVIDER_SITE_OTHER): Payer: Medicare Other | Admitting: Vascular Surgery

## 2012-07-26 ENCOUNTER — Encounter: Payer: Self-pay | Admitting: Vascular Surgery

## 2012-07-26 VITALS — BP 140/68 | HR 86 | Resp 18 | Ht 68.0 in | Wt 158.0 lb

## 2012-07-26 DIAGNOSIS — I739 Peripheral vascular disease, unspecified: Secondary | ICD-10-CM

## 2012-07-26 DIAGNOSIS — M79609 Pain in unspecified limb: Secondary | ICD-10-CM

## 2012-07-26 DIAGNOSIS — I96 Gangrene, not elsewhere classified: Secondary | ICD-10-CM

## 2012-07-26 NOTE — Progress Notes (Signed)
Patient has today for followup of left below-knee amputation on 06/24/2012. He had known severe unreconstructable tibial occlusive disease and underwent agitation with thigh high risk for failure. He did have a nonhealing and and subsequently underwent a below-knee amputation. He did well with this procedure and is in a nursing facility. He is here today with his son. He denies any discomfort associated with his left below-knee amputation and he does not have any pain in his right foot.  His left below-knee amputation is well healed and the staples will be removed today His right foot shows stable dry gangrene over the fourth toe with superficial ulcerations over several other times.  Impression and plan: Stable healing of left below-knee of dictation at one month. Staples will be removed today. He will see [place] on an as-needed basis. I did discuss with the patient and his son and he certainly is at risk for similar difficulty with his right foot. He will notify should he develop any progression

## 2012-07-27 ENCOUNTER — Encounter: Payer: Self-pay | Admitting: Nurse Practitioner

## 2012-07-27 NOTE — Progress Notes (Signed)
  Subjective:    Patient ID: Gerald Tucker, male    DOB: [birth year], 79 y.o.   MRN: [id number]  HPI    Review of Systems     Objective:   Physical Exam        Assessment & Plan:   This encounter was created in error - please disregard.

## 2012-09-01 ENCOUNTER — Encounter: Payer: Self-pay | Admitting: Nurse Practitioner

## 2012-09-19 ENCOUNTER — Encounter: Payer: Self-pay | Admitting: Nurse Practitioner

## 2012-10-11 ENCOUNTER — Non-Acute Institutional Stay (SKILLED_NURSING_FACILITY): Payer: Medicare Other | Admitting: Adult Health

## 2012-10-11 ENCOUNTER — Encounter: Payer: Self-pay | Admitting: Adult Health

## 2012-10-11 DIAGNOSIS — M549 Dorsalgia, unspecified: Secondary | ICD-10-CM

## 2012-10-11 DIAGNOSIS — N186 End stage renal disease: Secondary | ICD-10-CM

## 2012-10-11 DIAGNOSIS — K5904 Chronic idiopathic constipation: Secondary | ICD-10-CM

## 2012-10-11 DIAGNOSIS — Z992 Dependence on renal dialysis: Secondary | ICD-10-CM

## 2012-10-11 DIAGNOSIS — I2510 Atherosclerotic heart disease of native coronary artery without angina pectoris: Secondary | ICD-10-CM

## 2012-10-11 DIAGNOSIS — I10 Essential (primary) hypertension: Secondary | ICD-10-CM

## 2012-10-11 DIAGNOSIS — K219 Gastro-esophageal reflux disease without esophagitis: Secondary | ICD-10-CM

## 2012-10-11 DIAGNOSIS — K5909 Other constipation: Secondary | ICD-10-CM

## 2012-10-11 DIAGNOSIS — I4891 Unspecified atrial fibrillation: Secondary | ICD-10-CM

## 2012-10-11 NOTE — Progress Notes (Signed)
Patient ID: Gerald Tucker, male   DOB: [birth year], 79 y.o.   MRN: [id number]  [facility]  Allergies  Allergen Reactions  . Penicillins Rash    "felt like on fire"  . Morphine And Related     Agitated and sees things  . Amoxicillin     Pt reported unknown reaction  . Darvocet (Propoxyphene-Acetaminophen)     Agitated and sees things  . Keflex (Cephalexin) Other (See Comments)    Pt reported unknown reaction  . Neurontin (Gabapentin)     Agitated and confused  . Ultram (Tramadol Hcl)     Agitated and sees things. Can take small amounts  . Vancomycin Itching      Chief Complaint  Patient presents with  . Medical Managment of Chronic Issues    HPI:   He is being seen for the management of his chronic illnesses. The nursing staff reports that  He is having back pain. He tells me essentially to leave him alone that his back is not hurting him now that he has taken his medications and that he is doing all right.  Past Medical History  Diagnosis Date  . Hypertension   . Ulcer   . GERD (gastroesophageal reflux disease)   . CAD (coronary artery disease)     distal LAD occlusion  . Poor circulation   . ESRD on dialysis   . Hyperlipidemia   . Cancer     Skin  . Knee pain, right   . OSA (obstructive sleep apnea)   . PAD (peripheral artery disease)   . IDA (iron deficiency anemia)   . Hyperparathyroidism   . Arthritis   . Atrial fibrillation   . Aortic stenosis     Past Surgical History  Procedure Laterality Date  . Back surgery    . Neck surgery    . Hernia repair      right  . Knee surgery    . Leg surgery    . Shoulder surgery    . Cardiac catheterization  02/06/2005    EF 65-70%.  . Av fistula placement      LEFT ARM  . Cataract extraction, bilateral    . Cholecystectomy    . Transthoracic echocardiogram  04/04/2009    EF 60-65%  . Transthoracic echocardiogram  02/07/2008    EF 55-60%  . Cardiovascular stress test  06/01/2006    EF 55%. NORMAL LV  FUNCTION  . Amputation Left 06/24/2012    Procedure: LEFT BELOW KNEE AMPUTATION;  Surgeon: Larina Earthly, MD;  Location: [facility] OR;  Service: Vascular;  Laterality: Left;    VITAL SIGNS BP 118/70  Pulse 72  Ht 5\' 8"  (1.727 m)  Wt 151 lb 14.4 oz (68.901 kg)  BMI 23.1 kg/m2   Patient's Medications  New Prescriptions   No medications on file  Previous Medications   ACETAMINOPHEN (TYLENOL) 325 MG TABLET    Take 650 mg by mouth 3 (three) times daily. For pain   ALUMINUM & MAGNESIUM HYDROXIDE-SIMETHICONE (MYLANTA) 500-450-40 MG/5ML SUSPENSION    Take 30 mLs by mouth 4 (four) times daily.   ASPIRIN 81 MG TABLET    Take 81 mg by mouth daily.     B COMPLEX-C-FOLIC ACID (RENA-VITE PO)    Take 1 tablet by mouth daily.   CALCIUM ACETATE (PHOSLO) 667 MG CAPSULE    Take 667 mg by mouth 3 (three) times daily with meals.    ISOSORBIDE MONONITRATE (IMDUR) 60 MG 24 HR TABLET  Take 60 mg by mouth daily.     LIDODERM 5 %    Place 1 patch onto the skin daily. One patch to back daily on in the am and off in the pm   METOCLOPRAMIDE (REGLAN) 5 MG TABLET    Take 2.5 mg by mouth 4 (four) times daily.   METOPROLOL (LOPRESSOR) 50 MG TABLET    Take 50 mg by mouth 2 (two) times daily.    NITROGLYCERIN (NITROSTAT) 0.4 MG SL TABLET    Place 1 tablet (0.4 mg total) under the tongue every 5 (five) minutes as needed.   NYSTATIN OINTMENT (MYCOSTATIN)    Apply 1 application topically 2 (two) times daily.   OMEPRAZOLE (PRILOSEC) 20 MG CAPSULE    Take 20 mg by mouth 2 (two) times daily.     OXYCODONE (ROXICODONE) 5 MG IMMEDIATE RELEASE TABLET    Take one tablet by mouth every 8 hours as needed for pain. Hold for sedation/confusion.   PANTOPRAZOLE (PROTONIX) 40 MG TABLET    Take 40 mg by mouth 2 (two) times daily.   POLYETHYLENE GLYCOL (MIRALAX / GLYCOLAX) PACKET    Take 17 g by mouth daily.   SIMVASTATIN (ZOCOR) 20 MG TABLET    Take 20 mg by mouth at bedtime.     TRAMADOL (ULTRAM) 50 MG TABLET    Take 25 mg by mouth every 8  (eight) hours as needed for pain. Also give prior to dialysis for back pain  Modified Medications   No medications on file  Discontinued Medications   No medications on file    SIGNIFICANT DIAGNOSTIC EXAMS  07-27-12: chest x-ray: moderateelevation right hemidiaphragm minimal cardiomegaly; no significant pulmonary vascular congestion. Small left pleural effusion; patchy pneumonitis  At the left mid/lower lung  09-20-12: kub: mild ileus; no bowel obstruction; atherosclerotic vascular calcifications seen within the pelvis   LABS REVIEWED:  06-29-12: wbc 15.7; hgb 8.7; hct 25.6 ;mcv 97.3; plt 96; glucose 127; bun42; creat 5.74; k+ 3.0; na++ 138 07-28-12: wbc 10.0; hgb 10.6; hct 33.0; mcv 102.2;plt313; glucose 59; bun 35; creat 4.44; k+ 3.9 Na++ 139  07-30-12: urine culture: p.aeruginosa: fortaz 08-26-12: chol 92; ldl 46; trig 69  Review of Systems  Unable to perform ROS patient declined ROS  Physical Exam  Constitutional: He appears well-developed and well-nourished.  Neck: Neck supple. No JVD present. No thyromegaly present.  Cardiovascular: Normal rate and intact distal pulses.   Heart rate irreg  Respiratory: Effort normal and breath sounds normal. No respiratory distress.  GI: Soft. Bowel sounds are normal. He exhibits no distension. There is no tenderness.  Musculoskeletal: He exhibits no edema.  Is status post left bka  Neurological: He is alert.  Skin: Skin is warm and dry.   Marland KitchenPHYSEXAM    ASSESSMENT/ PLAN:  ESRD on dialysis Is followed by nephrology is on hemodialysis three days per week 1200  CC fluid restriction dialysis  on phoslo 667 mg three times daily is on rena vite daily will not make changes will monitor his status   CAD (coronary artery disease) Is stable will continue imdur 60 mg daily asa 81 mg daily and prn ntg   and will monitor his status   Atrial fibrillation Will continue asa 81 mg daily   GERD (gastroesophageal reflux disease) Is currently stable is on  protonix 40 mg twice daily reglan 2.5 mg four times daily and mylanta 30 cc four times daily. As he is on hemodialysis will stop the mylanta and will continue  to monitor his status   Hypertension Will continue lopressor 50 mg twice daily   Back pain He is presently denying any pain; will continue lidoderm patch to his back daily; will continue tylenol 650 mg three times daily and ultram 25 mg every 8 hours as needed and prior to dialysis and will monitor his status   Constipation - functional Is stable will continue miralax daily    Time spent with patient 50 minutes

## 2012-10-11 NOTE — Assessment & Plan Note (Signed)
Will continue lopressor 50 mg twice daily

## 2012-10-11 NOTE — Assessment & Plan Note (Signed)
Is followed by nephrology is on hemodialysis three days per week 1200  CC fluid restriction dialysis  on phoslo 667 mg three times daily is on rena vite daily will not make changes will monitor his status

## 2012-10-11 NOTE — Assessment & Plan Note (Addendum)
Is stable will continue imdur 60 mg daily asa 81 mg daily and prn ntg   and will monitor his status

## 2012-10-11 NOTE — Assessment & Plan Note (Signed)
Will continue asa 81 mg daily 

## 2012-10-11 NOTE — Assessment & Plan Note (Signed)
Is stable will continue miralax daily

## 2012-10-11 NOTE — Assessment & Plan Note (Signed)
He is presently denying any pain; will continue lidoderm patch to his back daily; will continue tylenol 650 mg three times daily and ultram 25 mg every 8 hours as needed and prior to dialysis and will monitor his status

## 2012-10-11 NOTE — Assessment & Plan Note (Signed)
Is currently stable is on protonix 40 mg twice daily reglan 2.5 mg four times daily and mylanta 30 cc four times daily. As he is on hemodialysis will stop the mylanta and will continue to monitor his status

## 2012-10-13 ENCOUNTER — Encounter: Payer: Self-pay | Admitting: Adult Health

## 2012-10-13 ENCOUNTER — Non-Acute Institutional Stay (SKILLED_NURSING_FACILITY): Payer: Medicare Other | Admitting: Adult Health

## 2012-10-13 ENCOUNTER — Telehealth: Payer: Self-pay

## 2012-10-13 DIAGNOSIS — S91301A Unspecified open wound, right foot, initial encounter: Secondary | ICD-10-CM

## 2012-10-13 DIAGNOSIS — S91309A Unspecified open wound, unspecified foot, initial encounter: Secondary | ICD-10-CM

## 2012-10-13 MED ORDER — CEFTAZIDIME 500 MG IJ SOLR: 500.0000 mg | Freq: Two times a day (BID) | INTRAMUSCULAR | Status: AC

## 2012-10-13 NOTE — Telephone Encounter (Signed)
Rec'd. phone call from nurse at [facility].  Stated pt. was evaluated by the nurse practitioner today, and requesting an "appointment ASAP today, or tomorrow."  Reports pt. has necrotic right great toe and 4th toe.  Describes areas of necrosis as being soft, moist, and gray.   States pt. having discomfort right foot with weight bearing, but denies c/o rest pain.  No report of fever/ chills.  Discussed w/ Dr. Darrick Penna.  Recommends if pt. Is afebrile, and not septic, then can f/u with Dr. Arbie Cookey next Tues., 7/29; per Dr. Darrick Penna no need to repeat ABI's with known unreconstructable tibial occlusive disease.   Dr. Darrick Penna discussed pt's condition with nurse practitioner @ [facility].  Appt. given for 11:45 AM  10/18/12.

## 2012-10-13 NOTE — Assessment & Plan Note (Signed)
He is status post left bka. I have spoken with Dr. Jettie Booze; he will be seen in the office on Tuesday 10-18-12. He is not a candidate for any type of reconstructive surgery and will more than likely require an amputation of the lower extremity. He is not having pain in his foot. Will begin fortaz 500 mg im twice daily for 3 weeks will begin florastor twice daily for 3 weeks for probiotic and will continue to monitor his status

## 2012-10-13 NOTE — Progress Notes (Signed)
Patient ID: Gerald Tucker, male   DOB: December 30, 1918, 79 y.o.   MRN: [id number]  [facility]  Allergies  Allergen Reactions  . Penicillins Rash    "felt like on fire"  . Morphine And Related     Agitated and sees things  . Amoxicillin     Pt reported unknown reaction  . Darvocet (Propoxyphene-Acetaminophen)     Agitated and sees things  . Keflex (Cephalexin) Other (See Comments)    Pt reported unknown reaction  . Neurontin (Gabapentin)     Agitated and confused  . Ultram (Tramadol Hcl)     Agitated and sees things. Can take small amounts  . Vancomycin Itching     Chief Complaint  Patient presents with  . Acute Visit    right foot wounds     HPI: I have been asked to see him for hs right foot ulceration on his right great toe and right 4th toe. The right great toe has a black eschar area with slight redness present and the right 4th toe has eschar covering the entire toe. There is no odor present he denies pain. He will need to be seen by the vascular surgeon once again.   Past Medical History  Diagnosis Date  . Hypertension   . Ulcer   . GERD (gastroesophageal reflux disease)   . CAD (coronary artery disease)     distal LAD occlusion  . Poor circulation   . ESRD on dialysis   . Hyperlipidemia   . Cancer     Skin  . Knee pain, right   . OSA (obstructive sleep apnea)   . PAD (peripheral artery disease)   . IDA (iron deficiency anemia)   . Hyperparathyroidism   . Arthritis   . Atrial fibrillation   . Aortic stenosis     Past Surgical History  Procedure Laterality Date  . Back surgery    . Neck surgery    . Hernia repair      right  . Knee surgery    . Leg surgery    . Shoulder surgery    . Cardiac catheterization  02/06/2005    EF 65-70%.  . Av fistula placement      LEFT ARM  . Cataract extraction, bilateral    . Cholecystectomy    . Transthoracic echocardiogram  04/04/2009    EF 60-65%  . Transthoracic echocardiogram  02/07/2008    EF 55-60%  .  Cardiovascular stress test  06/01/2006    EF 55%. NORMAL LV FUNCTION  . Amputation Left 06/24/2012    Procedure: LEFT BELOW KNEE AMPUTATION;  Surgeon: Larina Earthly, MD;  Location: [facility];  Service: Vascular;  Laterality: Left;    VITAL SIGNS BP 123/62  Pulse 69  Ht 5\' 8"  (1.727 m)  Wt 151 lb (68.493 kg)  BMI 22.96 kg/m2   Patient's Medications  New Prescriptions   No medications on file  Previous Medications   ACETAMINOPHEN (TYLENOL) 325 MG TABLET    Take 650 mg by mouth 3 (three) times daily. For pain   ASPIRIN 81 MG TABLET    Take 81 mg by mouth daily.     B COMPLEX-C-FOLIC ACID (RENA-VITE PO)    Take 1 tablet by mouth daily.   CALCIUM ACETATE (PHOSLO) 667 MG CAPSULE    Take 667 mg by mouth 3 (three) times daily with meals.    ISOSORBIDE MONONITRATE (IMDUR) 60 MG 24 HR TABLET    Take 60 mg by mouth  daily.     LIDODERM 5 %    Place 1 patch onto the skin daily. One patch to back daily on in the am and off in the pm   METOCLOPRAMIDE (REGLAN) 5 MG TABLET    Take 2.5 mg by mouth 4 (four) times daily.   METOPROLOL (LOPRESSOR) 50 MG TABLET    Take 50 mg by mouth 2 (two) times daily.    NITROGLYCERIN (NITROSTAT) 0.4 MG SL TABLET    Place 1 tablet (0.4 mg total) under the tongue every 5 (five) minutes as needed.   NYSTATIN OINTMENT (MYCOSTATIN)    Apply 1 application topically 2 (two) times daily.   OMEPRAZOLE (PRILOSEC) 20 MG CAPSULE    Take 20 mg by mouth 2 (two) times daily.     OXYCODONE (ROXICODONE) 5 MG IMMEDIATE RELEASE TABLET    Take one tablet by mouth every 8 hours as needed for pain. Hold for sedation/confusion.   PANTOPRAZOLE (PROTONIX) 40 MG TABLET    Take 40 mg by mouth 2 (two) times daily.   POLYETHYLENE GLYCOL (MIRALAX / GLYCOLAX) PACKET    Take 17 g by mouth daily.   SIMVASTATIN (ZOCOR) 20 MG TABLET    Take 20 mg by mouth at bedtime.     TRAMADOL (ULTRAM) 50 MG TABLET    Take 25 mg by mouth every 8 (eight) hours as needed for pain. Also give prior to dialysis for back pain   Modified Medications   No medications on file  Discontinued Medications   No medications on file    SIGNIFICANT DIAGNOSTIC EXAMS   07-27-12: chest x-ray: moderateelevation right hemidiaphragm minimal cardiomegaly; no significant pulmonary vascular congestion. Small left pleural effusion; patchy pneumonitis  At the left mid/lower lung  09-20-12: kub: mild ileus; no bowel obstruction; atherosclerotic vascular calcifications seen within the pelvis   LABS REVIEWED:  06-29-12: wbc 15.7; hgb 8.7; hct 25.6 ;mcv 97.3; plt 96; glucose 127; bun42; creat 5.74; k+ 3.0; na++ 138 07-28-12: wbc 10.0; hgb 10.6; hct 33.0; mcv 102.2;plt313; glucose 59; bun 35; creat 4.44; k+ 3.9 Na++ 139  07-30-12: urine culture: p.aeruginosa: fortaz 08-26-12: chol 92; ldl 46; trig 69  Review of Systems  Unable to perform ROS  Physical Exam  Constitutional: He appears well-developed and well-nourished.  Neck: Neck supple. No thyromegaly present.  Cardiovascular: Normal rate.   Heart rate irregular right pp+1  Respiratory: Effort normal and breath sounds normal. No respiratory distress. He has no wheezes.  GI: Soft. Bowel sounds are normal. He exhibits no distension. There is no tenderness.  Musculoskeletal: He exhibits no edema.  Is out of bed to wheelchair  Neurological: He is alert.  Skin: Skin is warm and dry.  Right great toe outer aspect: arterial ulcer: 1.8 x 1.6 x0 cm 100% hardened black eschar with surrounding callus; peri-wound area is slightly inflamed  Right 2nd toenail and pernail blackened  Right 4th toe: 2.5 x 1.5 x 0 cm entire toe tip covered with thick dry hardened brown, gray, black eschar with lifting at edges and soft tissue underneath. Scant serous drainage present no odor present  Right 5th toenail black         ASSESSMENT/ PLAN:  Open wound of right foot with complication He is status post left bka. I have spoken with Dr. Jettie Booze; he will be seen in the office on Tuesday 10-18-12. He is not  a candidate for any type of reconstructive surgery and will more than likely require an amputation of the lower  extremity. He is not having pain in his foot. Will begin fortaz 500 mg im twice daily for 3 weeks will begin florastor twice daily for 3 weeks for probiotic and will continue to monitor his status       Time spent with patient 30 minutes

## 2012-10-17 ENCOUNTER — Encounter: Payer: Self-pay | Admitting: Vascular Surgery

## 2012-10-18 ENCOUNTER — Encounter: Payer: Self-pay | Admitting: Vascular Surgery

## 2012-10-18 ENCOUNTER — Ambulatory Visit (INDEPENDENT_AMBULATORY_CARE_PROVIDER_SITE_OTHER): Payer: Medicare Other | Admitting: Vascular Surgery

## 2012-10-18 VITALS — BP 127/74 | HR 82 | Resp 18 | Ht 68.0 in | Wt 151.0 lb

## 2012-10-18 DIAGNOSIS — I96 Gangrene, not elsewhere classified: Secondary | ICD-10-CM

## 2012-10-18 DIAGNOSIS — I70269 Atherosclerosis of native arteries of extremities with gangrene, unspecified extremity: Secondary | ICD-10-CM

## 2012-10-18 NOTE — Progress Notes (Signed)
The patient has today for evaluation of gangrenous changes in the toes of his right foot. He is well known to me from evaluation several months ago similar problems on the left. He has a long history of peripheral vascular disease and undergone arteriography and 2010 showing severe tibial unreconstructable disease. He underwent below-knee amputation in [place] and had eventual healing of his left below-knee amputation. He is here today with a 3 of his family members. He is 89. He is in a wheelchair and does have mild to moderate confusion. He reports he has fallen several times in his nursing facility.  Past Medical History  Diagnosis Date  . Hypertension   . Ulcer   . GERD (gastroesophageal reflux disease)   . CAD (coronary artery disease)     distal LAD occlusion  . Poor circulation   . ESRD on dialysis   . Hyperlipidemia   . Cancer     Skin  . Knee pain, right   . OSA (obstructive sleep apnea)   . PAD (peripheral artery disease)   . IDA (iron deficiency anemia)   . Hyperparathyroidism   . Arthritis   . Atrial fibrillation   . Aortic stenosis     History  Substance Use Topics  . Smoking status: Never Smoker   . Smokeless tobacco: Never Used  . Alcohol Use: No    Family History  Problem Relation Age of Onset  . Stroke Mother   . Heart attack Father   . Stroke Brother   . Stroke Brother   . Stroke Brother     Allergies  Allergen Reactions  . Penicillins Rash    "felt like on fire"  . Morphine And Related     Agitated and sees things  . Amoxicillin     Pt reported unknown reaction  . Darvocet (Propoxyphene-Acetaminophen)     Agitated and sees things  . Keflex (Cephalexin) Other (See Comments)    Pt reported unknown reaction  . Neurontin (Gabapentin)     Agitated and confused  . Ultram (Tramadol Hcl)     Agitated and sees things. Can take small amounts  . Vancomycin Itching    Current outpatient prescriptions:acetaminophen (TYLENOL) 325 MG tablet, Take 650  mg by mouth 3 (three) times daily. For pain, Disp: , Rfl: ;  aspirin 81 MG tablet, Take 81 mg by mouth daily.  , Disp: , Rfl: ;  B Complex-C-Folic Acid (RENA-VITE PO), Take 1 tablet by mouth daily., Disp: , Rfl: ;  calcium acetate (PHOSLO) 667 MG capsule, Take 667 mg by mouth 3 (three) times daily with meals. , Disp: , Rfl:  ceftAZIDime (FORTAZ) 500 MG injection, Inject 500 mg into the muscle 2 (two) times daily., Disp: 1 each, Rfl: 50;  feeding supplement (PRO-STAT SUGAR FREE 64) LIQD, Take 30 mLs by mouth 3 (three) times daily with meals., Disp: , Rfl: ;  isosorbide mononitrate (IMDUR) 60 MG 24 hr tablet, Take 60 mg by mouth daily.  , Disp: , Rfl: ;  LIDODERM 5 %, Place 1 patch onto the skin daily. One patch to back daily on in the am and off in the pm, Disp: , Rfl:  metoprolol (LOPRESSOR) 50 MG tablet, Take 50 mg by mouth 2 (two) times daily. , Disp: , Rfl: ;  nitroGLYCERIN (NITROSTAT) 0.4 MG SL tablet, Place 1 tablet (0.4 mg total) under the tongue every 5 (five) minutes as needed., Disp: 25 tablet, Rfl: 11;  nystatin ointment (MYCOSTATIN), Apply 1 application topically 2 (  two) times daily., Disp: , Rfl: ;  pantoprazole (PROTONIX) 40 MG tablet, Take 40 mg by mouth 2 (two) times daily., Disp: , Rfl:  polyethylene glycol (MIRALAX / GLYCOLAX) packet, Take 17 g by mouth daily., Disp: 10 each, Rfl: 0;  saccharomyces boulardii (FLORASTOR) 250 MG capsule, Take 250 mg by mouth 2 (two) times daily., Disp: , Rfl: ;  traMADol (ULTRAM) 50 MG tablet, Take 25 mg by mouth every 8 (eight) hours as needed for pain. Also give prior to dialysis for back pain, Disp: , Rfl:  metoCLOPramide (REGLAN) 5 MG tablet, Take 2.5 mg by mouth 4 (four) times daily., Disp: , Rfl: ;  omeprazole (PRILOSEC) 20 MG capsule, Take 20 mg by mouth 2 (two) times daily.  , Disp: , Rfl: ;  oxyCODONE (ROXICODONE) 5 MG immediate release tablet, Take one tablet by mouth every 8 hours as needed for pain. Hold for sedation/confusion., Disp: 90 tablet, Rfl:  0;  simvastatin (ZOCOR) 20 MG tablet, Take 20 mg by mouth at bedtime.  , Disp: , Rfl:   BP 127/74  Pulse 82  Resp 18  Ht 5\' 8"  (1.727 m)  Wt 151 lb (68.493 kg)  BMI 22.96 kg/m2  Body mass index is 22.96 kg/(m^2).       Physical exam well-developed elderly gentleman in a wheelchair with mild confusion. He is able to answer questions. He does have a well-healed left below-knee amputation. His right foot is noted for ulcerations over the great toe fourth toe and fifth toe. This is most prominent over the dorsal aspect of his fourth toe with some separation. There is no surrounding erythema  Impression and plan severe tibial disease with a risk for education. I discussed this at length with the Asian and multiple family members present. He fortunately is currently not having any significant pain and therefore would recommend continued observation only. He does understand that either progressive pain or progressive tissue loss would lead to below-knee amputation

## 2012-11-03 ENCOUNTER — Non-Acute Institutional Stay (SKILLED_NURSING_FACILITY): Payer: Medicare Other | Admitting: Nurse Practitioner

## 2012-11-03 DIAGNOSIS — B3749 Other urogenital candidiasis: Secondary | ICD-10-CM

## 2012-11-03 DIAGNOSIS — L22 Diaper dermatitis: Secondary | ICD-10-CM

## 2012-11-14 NOTE — Progress Notes (Signed)
This encounter was created in error - please disregard.

## 2012-12-02 ENCOUNTER — Telehealth: Payer: Self-pay

## 2012-12-02 NOTE — Telephone Encounter (Signed)
Rec'd call from Wound Care Nurse reporting pt. needs appt. ASAP.  Reports pt. has 3 new areas of necrosis on right foot; states has new area on right heel, (R) 1st metatarsal head, and tip of (R) great toe.  States pt. has a total of 7 areas of necrosis on (R) foot.  Denies that pt. having any pain at this time.  States family is requesting reevaluation to discuss amputation.  Appt. given for 12/07/12 @ 9:00 AM.

## 2012-12-07 ENCOUNTER — Ambulatory Visit: Payer: Medicare Other | Admitting: Vascular Surgery

## 2012-12-07 ENCOUNTER — Encounter: Payer: Self-pay | Admitting: Vascular Surgery

## 2012-12-08 ENCOUNTER — Ambulatory Visit (INDEPENDENT_AMBULATORY_CARE_PROVIDER_SITE_OTHER): Payer: Medicare Other | Admitting: Vascular Surgery

## 2012-12-08 ENCOUNTER — Encounter: Payer: Self-pay | Admitting: Vascular Surgery

## 2012-12-08 VITALS — BP 146/110 | HR 139 | Resp 18 | Ht 68.0 in | Wt 151.0 lb

## 2012-12-08 DIAGNOSIS — I70269 Atherosclerosis of native arteries of extremities with gangrene, unspecified extremity: Secondary | ICD-10-CM

## 2012-12-08 DIAGNOSIS — I739 Peripheral vascular disease, unspecified: Secondary | ICD-10-CM

## 2012-12-08 NOTE — Progress Notes (Signed)
Patient is a 79 year old male who has previously been followed by Dr. Arbie Cookey. He has previously had a left below-knee amputation in April 2014. He was referred from his nursing home for deterioration of his right foot. He was last seen by Dr. Arbie Cookey and late July. At that time his right foot was noted for ulcerations over the great toe fourth toe and fifth toe. This is most prominent over the dorsal aspect of his fourth toe with some separation. There was no surrounding erythema.  At that time a plan was made to consider a right below-knee amputation if there is further deterioration of his foot. The patient has now developed a blister on his right heel. The patient is demented. He is unable to use his right leg. He has been unable to use the right leg for several months. The patient's son was present for the office visit today.  Social history: The patient currently resides attached in place.  Review of systems: Unable to obtain from patient due to his dementia and hearing deficit.  Physical exam:  Filed Vitals:   12/08/12 1508  BP: 146/110  Pulse: 139  Resp: 18  Height: 5\' 8"  (1.727 m)  Weight: 151 lb (68.493 kg)  SpO2: 96%    Right lower extremity: No palpable pedal pulses, right fourth toe dry gangrene, right fifth toe tip dry gangrene, right first toe tip early dry gangrene, lateral aspect of first toe 3 cm area of dry eschar, 2 cm of area of dry eschar right first metatarsal head, 3 cm blister left medial aspect of heel no drainage, some areas of dependent rubor but no frank erythema  Assessment: Multiple ulcerations right foot, no associated pain symptoms, no obvious invasive infection.  Plan: Continued observation for now. If the wound continues to deteriorate consideration for right above or below-knee amputation. No urgent indication for amputation at this point as he does not have invasive infection the wounds on his foot are fairly minor in nature and there is no associated pain. The  patient will followup with Dr. Arbie Cookey in one month to see if he has had further deterioration for consideration of amputation if necessary.  Fabienne Bruns, MD [facility] Office: [phone] Pager: [phone]

## 2012-12-29 ENCOUNTER — Non-Acute Institutional Stay (SKILLED_NURSING_FACILITY): Payer: Medicare Other | Admitting: Nurse Practitioner

## 2012-12-29 DIAGNOSIS — N186 End stage renal disease: Secondary | ICD-10-CM

## 2012-12-29 DIAGNOSIS — I10 Essential (primary) hypertension: Secondary | ICD-10-CM

## 2012-12-29 DIAGNOSIS — Z5189 Encounter for other specified aftercare: Secondary | ICD-10-CM

## 2012-12-29 DIAGNOSIS — S88112D Complete traumatic amputation at level between knee and ankle, left lower leg, subsequent encounter: Secondary | ICD-10-CM

## 2012-12-29 DIAGNOSIS — S91301A Unspecified open wound, right foot, initial encounter: Secondary | ICD-10-CM

## 2012-12-29 DIAGNOSIS — S91309A Unspecified open wound, unspecified foot, initial encounter: Secondary | ICD-10-CM

## 2012-12-29 DIAGNOSIS — E1129 Type 2 diabetes mellitus with other diabetic kidney complication: Secondary | ICD-10-CM

## 2012-12-29 DIAGNOSIS — N058 Unspecified nephritic syndrome with other morphologic changes: Secondary | ICD-10-CM

## 2013-01-09 ENCOUNTER — Encounter: Payer: Self-pay | Admitting: Vascular Surgery

## 2013-01-10 ENCOUNTER — Telehealth: Payer: Self-pay | Admitting: Vascular Surgery

## 2013-01-10 ENCOUNTER — Ambulatory Visit: Payer: Medicare Other | Admitting: Vascular Surgery

## 2013-01-11 NOTE — Telephone Encounter (Signed)
News and Record Gerald Tucker says patietn died on 30-Jan-2014. Renato Gails

## 2013-01-11 NOTE — Telephone Encounter (Signed)
Patient noted as deceased in [facility]. Jennifer L Arrington ° °

## 2013-01-21 DEATH — deceased

## 2013-03-21 NOTE — Progress Notes (Signed)
12/30/2038 [facility], room 1107  Patient ID: BLANE WORTHINGTON, male   DOB: 01/11/1919, 79 y.o.   MRN: [id number]  Allergies  Allergen Reactions  . Penicillins Rash    "felt like on fire"  . Morphine And Related     Agitated and sees things  . Amoxicillin     Pt reported unknown reaction  . Darvocet [Propoxyphene N-Acetaminophen]     Agitated and sees things  . Keflex [Cephalexin] Other (See Comments)    Pt reported unknown reaction  . Neurontin [Gabapentin]     Agitated and confused  . Ultram [Tramadol Hcl]     Agitated and sees things. Can take small amounts  . Vancomycin Itching   Chief Complaint  Patient presents with  . Acute Visit   HPI:  Pt is a 79 year old Caucasian male, with long history of diabetes, and dialysis therapy.  Decision has been made to stop dialysis and discharge pt home to be under the care of hospice.  His family is present.  Review of Systems  Constitutional: Positive for malaise/fatigue.  HENT: Negative for ear discharge, ear pain and nosebleeds.   Eyes: Negative for discharge and redness.  Respiratory: Negative for hemoptysis and sputum production.   Cardiovascular: Positive for leg swelling.  Gastrointestinal: Negative for blood in stool.  Genitourinary:       Patient does not make urine  Neurological: Positive for speech change and weakness.       Patient's speech is becoming more difficult to understand  Psychiatric/Behavioral: Negative for hallucinations.   Past Medical History  Diagnosis Date  . Hypertension   . Ulcer   . GERD (gastroesophageal reflux disease)   . CAD (coronary artery disease)     distal LAD occlusion  . Poor circulation   . ESRD on dialysis   . Hyperlipidemia   . Cancer     Skin  . Knee pain, right   . OSA (obstructive sleep apnea)   . PAD (peripheral artery disease)   . IDA (iron deficiency anemia)   . Hyperparathyroidism   . Arthritis   . Atrial fibrillation   . Aortic stenosis    Past Surgical  History  Procedure Laterality Date  . Back surgery    . Neck surgery    . Hernia repair      right  . Knee surgery    . Leg surgery    . Shoulder surgery    . Cardiac catheterization  02/06/2005    EF 65-70%.  . Av fistula placement      LEFT ARM  . Cataract extraction, bilateral    . Cholecystectomy    . Transthoracic echocardiogram  04/04/2009    EF 60-65%  . Transthoracic echocardiogram  02/07/2008    EF 55-60%  . Cardiovascular stress test  06/01/2006    EF 55%. NORMAL LV FUNCTION  . Amputation Left 06/24/2012    Procedure: LEFT BELOW KNEE AMPUTATION;  Surgeon: Larina Earthly, MD;  Location: [facility];  Service: Vascular;  Laterality: Left;   Current Outpatient Prescriptions on File Prior to Visit  Medication Sig Dispense Refill  . acetaminophen (TYLENOL) 325 MG tablet Take 650 mg by mouth 3 (three) times daily. For pain      . aspirin 81 MG tablet Take 81 mg by mouth daily.        . B Complex-C-Folic Acid (RENA-VITE PO) Take 1 tablet by mouth daily.      . calcium acetate (PHOSLO)  667 MG capsule Take 667 mg by mouth 3 (three) times daily with meals.       . ceftAZIDime (FORTAZ) 500 MG injection Inject 500 mg into the muscle 2 (two) times daily.  1 each  50  . feeding supplement (PRO-STAT SUGAR FREE 64) LIQD Take 30 mLs by mouth 3 (three) times daily with meals.      . isosorbide mononitrate (IMDUR) 60 MG 24 hr tablet Take 60 mg by mouth daily.        Marland Kitchen LIDODERM 5 % Place 1 patch onto the skin daily. One patch to back daily on in the am and off in the pm      . metoCLOPramide (REGLAN) 5 MG tablet Take 2.5 mg by mouth 4 (four) times daily.      . metoprolol (LOPRESSOR) 50 MG tablet Take 50 mg by mouth 2 (two) times daily.       . nitroGLYCERIN (NITROSTAT) 0.4 MG SL tablet Place 1 tablet (0.4 mg total) under the tongue every 5 (five) minutes as needed.  25 tablet  11  . nystatin ointment (MYCOSTATIN) Apply 1 application topically 2 (two) times daily.      Marland Kitchen omeprazole (PRILOSEC) 20 MG  capsule Take 20 mg by mouth 2 (two) times daily.        Marland Kitchen oxyCODONE (ROXICODONE) 5 MG immediate release tablet Take one tablet by mouth every 8 hours as needed for pain. Hold for sedation/confusion.  90 tablet  0  . pantoprazole (PROTONIX) 40 MG tablet Take 40 mg by mouth 2 (two) times daily.      . polyethylene glycol (MIRALAX / GLYCOLAX) packet Take 17 g by mouth daily.  10 each  0  . saccharomyces boulardii (FLORASTOR) 250 MG capsule Take 250 mg by mouth 2 (two) times daily.      . simvastatin (ZOCOR) 20 MG tablet Take 20 mg by mouth at bedtime.        . traMADol (ULTRAM) 50 MG tablet Take 25 mg by mouth every 8 (eight) hours as needed for pain. Also give prior to dialysis for back pain       No current facility-administered medications on file prior to visit.   Labs, from 11/23/2012 WBC 10.9 RBC 3.5 HGB 11.4 HCT 35.9 MCV 103.2 MCH 32.8 MCHC 31.8 RDW 17 PLT 266  Na 139 K 3.9 Cl 99 CO2 26 AGAP 14 BUN 43 Cr 7.2 BUN/CR 6 Ca 9  BP 132/64  Pulse 81  Temp(Src) 96.5 F (35.8 C)  Resp 20  SpO2 96%  Physical Exam  Constitutional: No distress.  HENT:  Head: Normocephalic and atraumatic.  Right Ear: External ear normal.  Left Ear: External ear normal.  Mouth/Throat: Oropharynx is clear and moist.  Eyes: Conjunctivae and EOM are normal. Pupils are equal, round, and reactive to light. Right eye exhibits no discharge. Left eye exhibits no discharge. No scleral icterus.  Neck: No thyromegaly present.  Cardiovascular: Normal heart sounds.   No murmur heard. Positive for popliteal pulse at left leg, unable to appreciate pedal pulses  Pulmonary/Chest: Breath sounds normal.  Abdominal: There is no tenderness. There is no guarding.  Musculoskeletal: He exhibits edema.  Positive for edema at left medial ankle, otherwise unremarkable  Lymphadenopathy:    He has no cervical adenopathy.  Neurological:  Patient is extremely sleepy but easily arousable. Has difficulty answering  questions, but is certainly oriented to self.  Skin: He is not diaphoretic. No erythema.  Wound on left second  toe, dry gangrene.  Psychiatric:  Patient is very sleepy, with limited interaction.    Assessment/Plan:  ESRD, have opted to stop dialysis, pt is to be discharged home under the care of hospice.  All dietary restrictions have been lifted.  Coronary artery disease, implement nitroglycerin sublingual, 0.4 mg, every 5 minutes as needed for  chest pain, Imdur, 60 mg 24 hour tablet, one tablet per day, and aspirin 81 mg per day  Pain control, Tylenol 650 mg 3 times a day for pain, Lidoderm 5% patch apply to the back in the morning remove in the evening. Oxycodone, 5 mg immediate release, may take one every 8 hours as needed for pain.  May also use tramadol, 50 mg, one half 25 mg every 8 hours as needed for pain.  GERD, Prilosec 20 mg once a day  Supplementation, Rena-Vite, one by mouth each day, calcium acetate, 667 mg, 3 times a day, pro-stat 30 mL was 3 times a day.  Hypertension, Lopressor, 50 mg twice a day  Candidiasis, and groin, nystatin ointment, later maintained at all times  Hyperlipidemia, Zocor 20 mg at bedtime  Patient and family may opt to suspend all medication at their discretion.

## 2013-04-17 NOTE — Progress Notes (Signed)
    Date of Visit 11/03/2012 Full Code Rm [id number]   Patient ID: Gerald Tucker, male   DOB: September 18, 1918, 80 y.o.   MRN: [id number]  Chief Complaint  Patient presents with  . Acute Visit     Allergies: Pencillins:  "felt like fire" (includes amoxicillin) Morphine and related: Agitated and sees things Darvocet:  Agitated and sees things Keflex:  Unknown reaction Neurontin:  Agitated and confused Ultram:  Agitated and confused, can tolerate small amounts Vancomycin:  Itching  HPI:  Reported by pt's son, as part of a routine visit, pt's candidiasis, currently receiving Nystatin cream to affected area.  Otherwise, management of chronic medical issues.    Diagnoses:   Hypertension:  On Metoprolol 50mg  twice a day, Isosorbide mononitrate, 60mg  each day GERD/Ulcer:  On Protonix 40mg  twice a day CAD, Distal LAD occlusion:  Nitroglycerin 0.4mg  SL every 5 minutes as needed for chest pain, max of three concurrent doses Peripheral Vascular Disease:  ASA 81mg  each day Atrial Fibrillation:  ASA 81mg  each day Aortic Stenosis:  Note cardiac meds Lipid Metabolism Disorder:  Zocor was discontinued r/t pt's physiological status History of Skin Cancer: R Knee pain, and Back pain:  Has Lidoderm for back pain, with patch being replaced every 24 hours.  Pt can tolerate Tramadol in small doses.  Can also receive Tylenol 650mg  three times a day as needed.   Iron deficiency:  Managed as per dialysis Hyperparathyroidism:  ESRD related, managed via Nephrology.  Pt also takes Phosplo 667mg  three times a day with meals ESRD:  On Dialysis M-W-F  Past Surgical History: 1. Back surgery 2. Neck surgery 3. R hernia repair 4. Knee and leg surgery 5. Shoulder  surgery 6. Transthoracic echocardiogram showing EF of 60-65% 7. Cardiac Stress Test showing normal LV function 8. Amputation [facility]KA 9. AV fistula L Upper Arm  Labs Reviewed:    No new labs done since last progress note.  In facility labs done every six  months, as pt has extensive labs done while in dialysis.    Physical Examination:   Pt alert, but only saying yes or no to questions.  Uncomfortable, but in no acute distress Neck:  Supple, no distinct carotid bruits, no palpable adenopathy, no thyromegaly.   Apical Pulse:  Irregularly irregular Respiratory:  Unlabored, only faint crackles appreciated in lung bases. Abdomen:  Soft and undistended, non tender to palpation Groin and Scrotum:  White "cheesy" drainage and erythema associated with candidiasis  BLE:  LBKA, stump unremarkable.  R lower leg, with only trace edema.  R toes with stable dry gangrene.  Pedal pulse not clearly palpable. Femoral pulses palpable L AV shunt easily palpated Psychiatric:  Pt is alert, but not engaging in conversation  CAD:  Will continue current medications Atrial Fibrillation:  will continue ASA at 81mg  GERD:  Certainly continue the Protonix 40mg  twice a day  Hypertension:  Current meds to continue Back pain, and leg pain:  Will continue the Lidoderm to be used everyday, low dose tramadol, prn Tylenol Constipation:  Will continue Miralax Candidiasis:  Will implement Nystatin Powder to be applied directly to rash.   Powder to be applied and maintained at a constant layer at all times, twice a day and as necessary.  Monitor for maceration.  May need to add desitin or equivalent over the Nystatin Powder.    New orders written.  Will continue to monitor over time.

## 2014-12-31 IMAGING — CT CT ABD-PELV W/ CM
2 of 5 series · 17 of 46 positions shown, 19 images · IV contrast (omnipaque)
Comparison: 04/04/2009

CLINICAL DATA: Fall, left lower rib pain and upper abdominal pain.

CT ABDOMEN AND PELVIS WITH CONTRAST
TECHNIQUE: Multidetector CT imaging of the abdomen and pelvis was
performed following the standard protocol during bolus
administration of intravenous contrast.
Contrast: 80mL OMNIPAQUE IOHEXOL 300 MG/ML  SOLN

[Series 2: abd/pelv with 5.0 b31f st · axial · 0.64mm/px · z∈[-419,-19]mm · 14 of 90 slices shown, 16 images]
[im 5/90  soft-tissue]
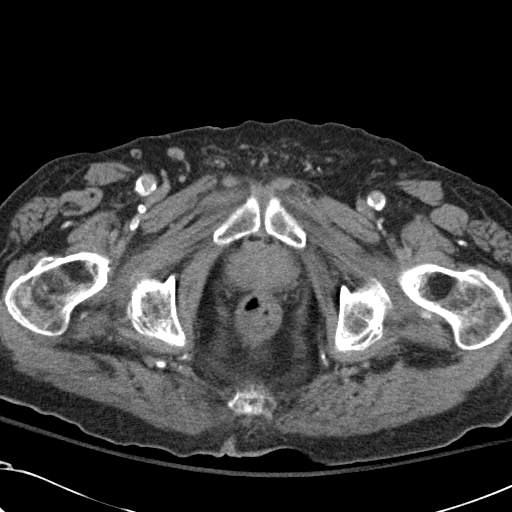
[im 5/90  bone]
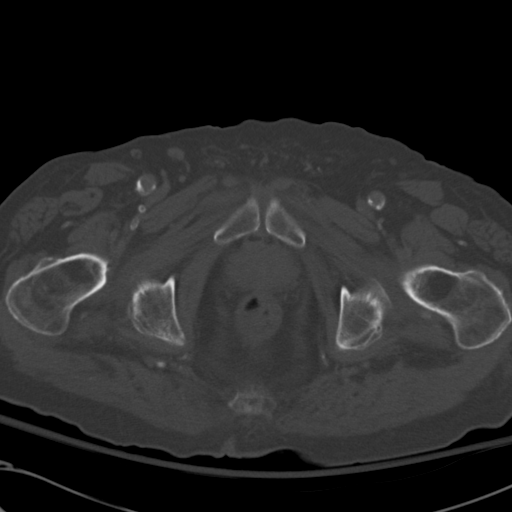
[im 10/90  soft-tissue]
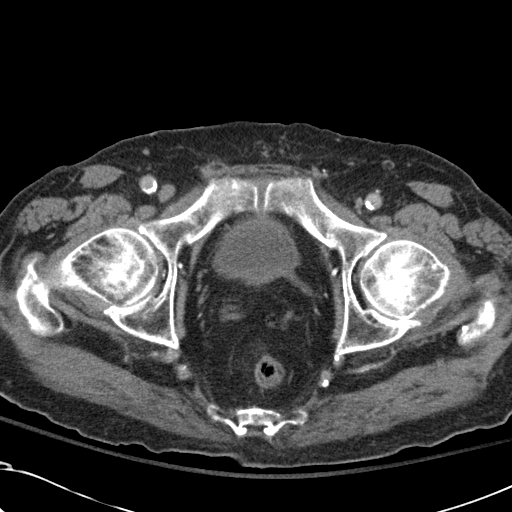
[im 19/90  soft-tissue]
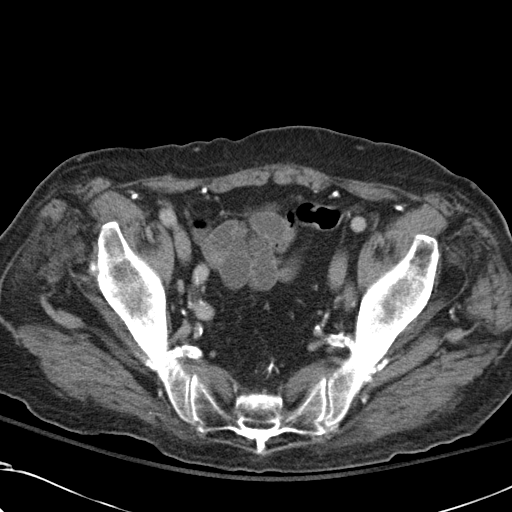
[im 24/90  soft-tissue]
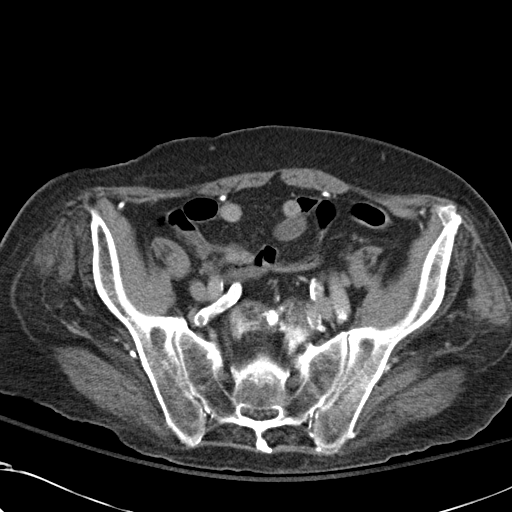
[im 29/90  soft-tissue]
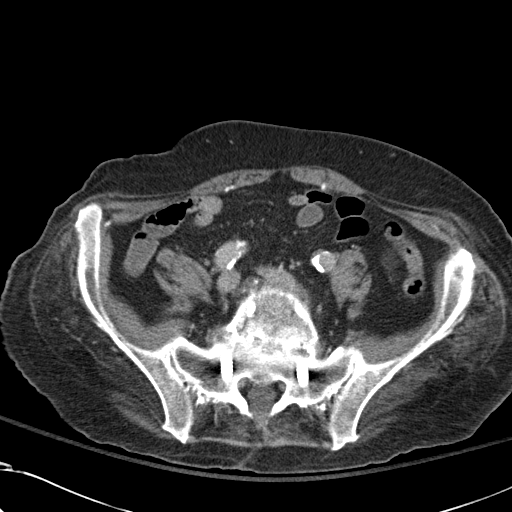
[im 38/90  soft-tissue]
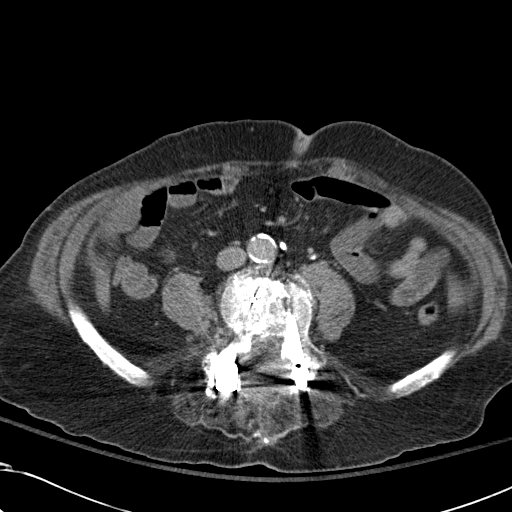
[im 43/90  soft-tissue]
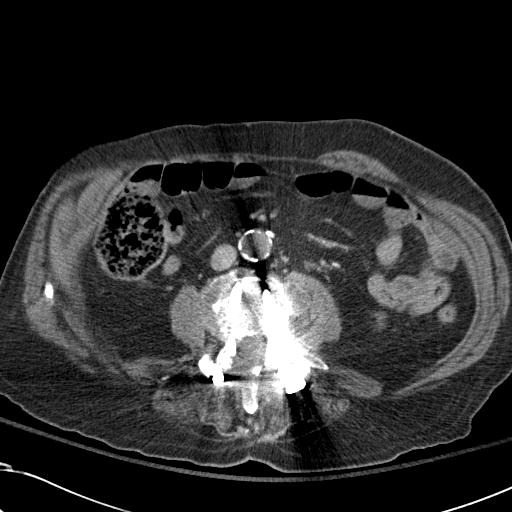
[im 47/90  soft-tissue]
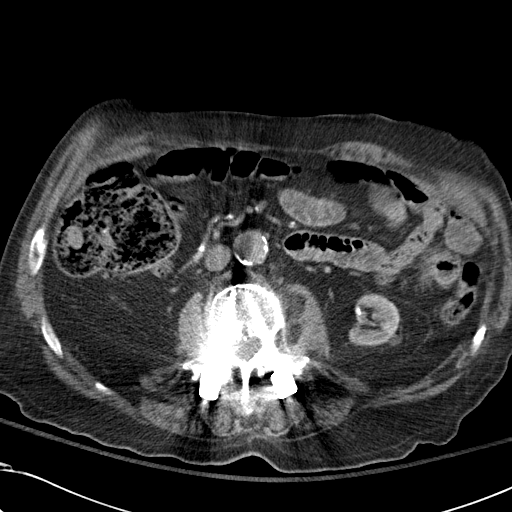
[im 52/90  soft-tissue]
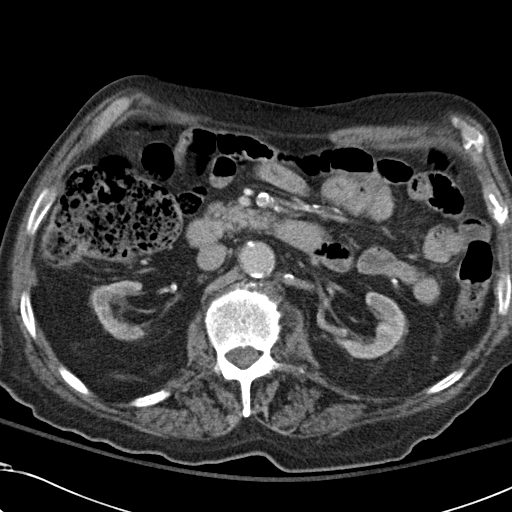
[im 52/90  bone]
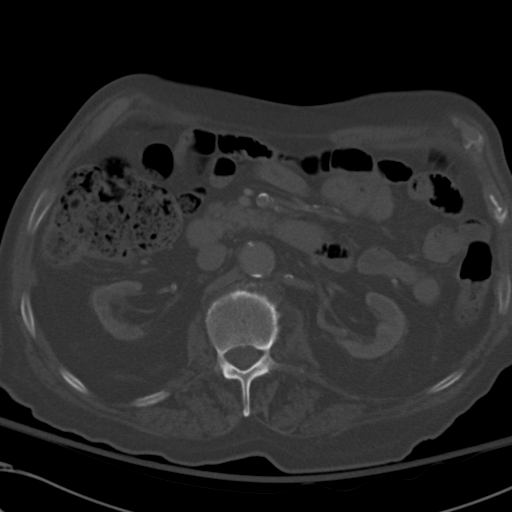
[im 61/90  soft-tissue]
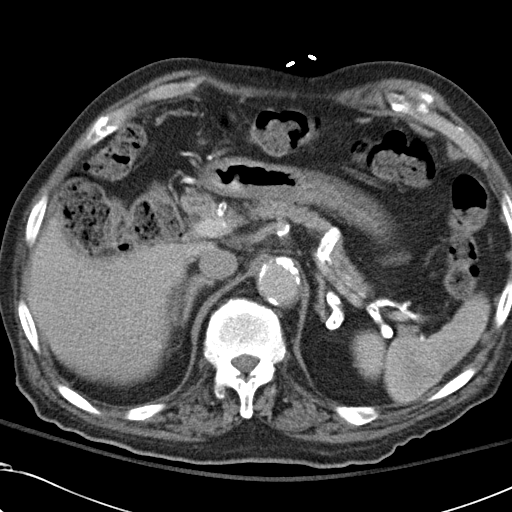
[im 66/90  soft-tissue]
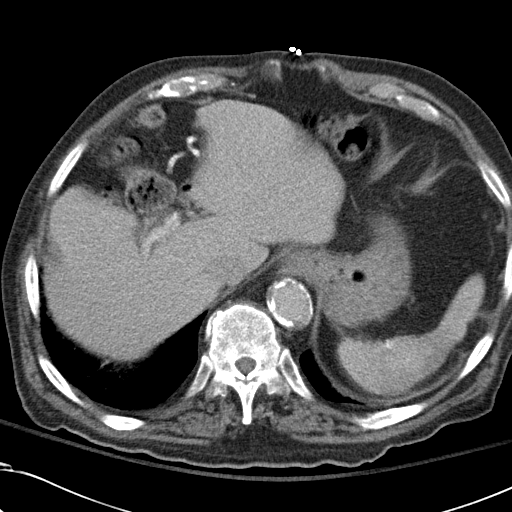
[im 71/90  soft-tissue]
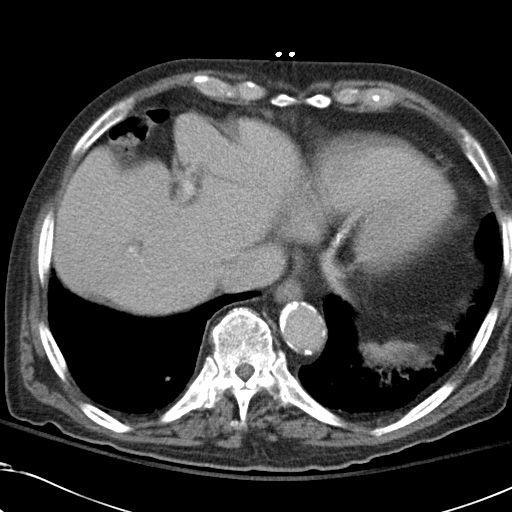
[im 80/90  soft-tissue]
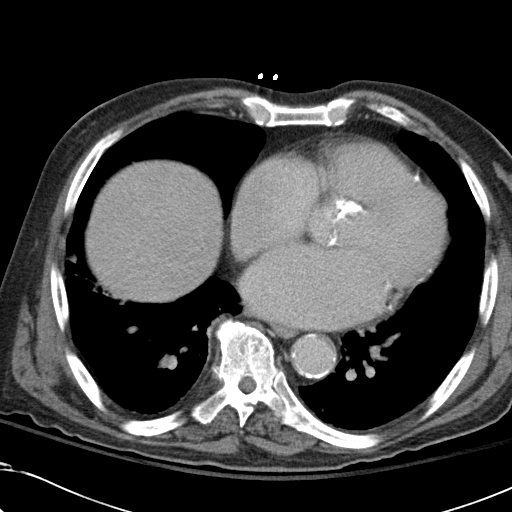
[im 85/90  soft-tissue]
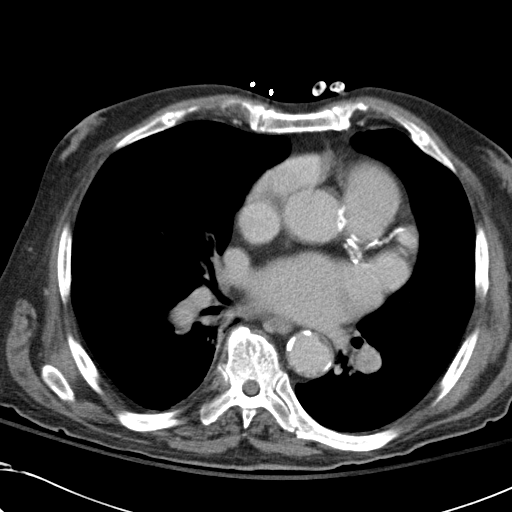

[Series 5: coronals · coronal · 0.88mm/px · 3 of 124 slices shown]
[im 42/124  soft-tissue]
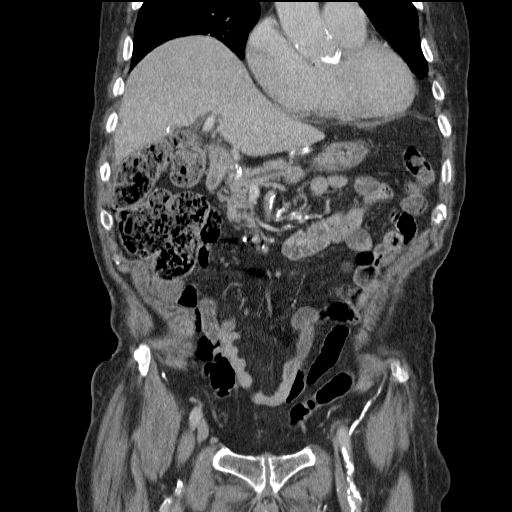
[im 55/124  soft-tissue]
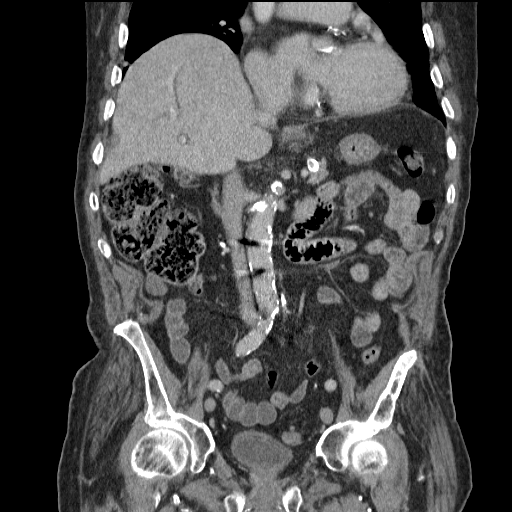
[im 69/124  soft-tissue]
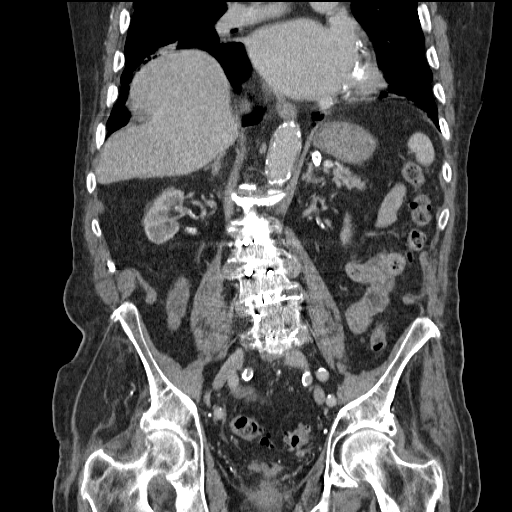

[17 of 46 positions shown; findings below may reference images not displayed]

FINDINGS: Heart is mildly enlarged.  Coronary artery, aortic and
mitral valve calcifications present.  There is left base
atelectasis.  No pleural effusions.  Probable nondisplaced
fractures through the left lateral sixth and seventh ribs.  No
visible pneumothorax.

Prior cholecystectomy.  No focal lesion in the liver.  Areas of
decreased enhancement within the spleen.  I favor this is vascular,
related to timing of the injection and imaging.  This does not
appear to represent a splenic laceration.  No surrounding free
fluid or blood.  Pancreas, adrenals are unremarkable.  Kidneys are
atrophic with severe cortical atrophy and vascular calcifications.
No hydronephrosis.

Aorta and branch vessels are heavily calcified, non-aneurysmal.  No
free fluid, free air or adenopathy.  Sigmoid diverticulosis.  Large
stool burden throughout the colon.  Small bowel is decompressed.
Urinary bladder grossly unremarkable.

No additional acute bony abnormality.  Postoperative changes in the
lower lumbar spine.  Grade II anterolisthesis of L5 on S1.
IMPRESSION: Suspect nondisplaced left sixth and seventh lateral rib fractures.
Left base atelectasis.

No evidence of solid organ injury.

Borderline heart size.

Sigmoid diverticulosis.

Postoperative changes in the lower lumbar spine.

## 2014-12-31 IMAGING — CR DG RIBS W/ CHEST 3+V*L*
3 series · 3 of 3 positions shown · non-contrast
Comparison: 01/01/2012 and prior chest radiographs

CLINICAL DATA: Fall with left chest and rib pain.

LEFT RIBS AND CHEST - 3+ VIEW

[t chest supine]
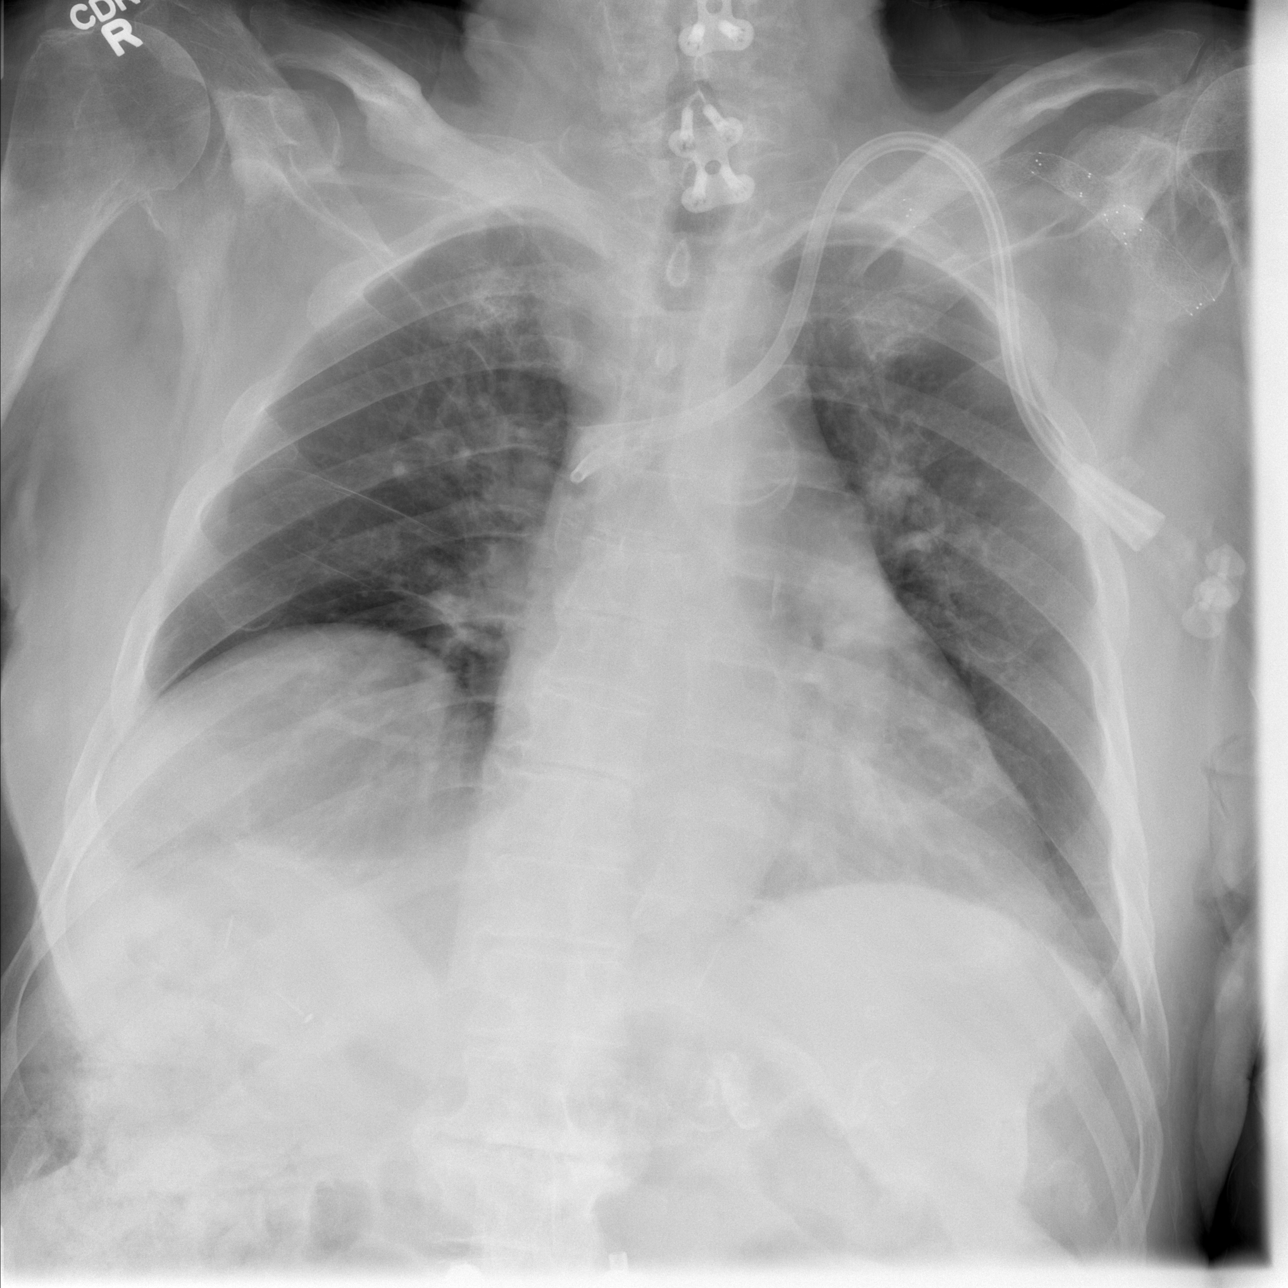

[t ribs ap/pa upper left]
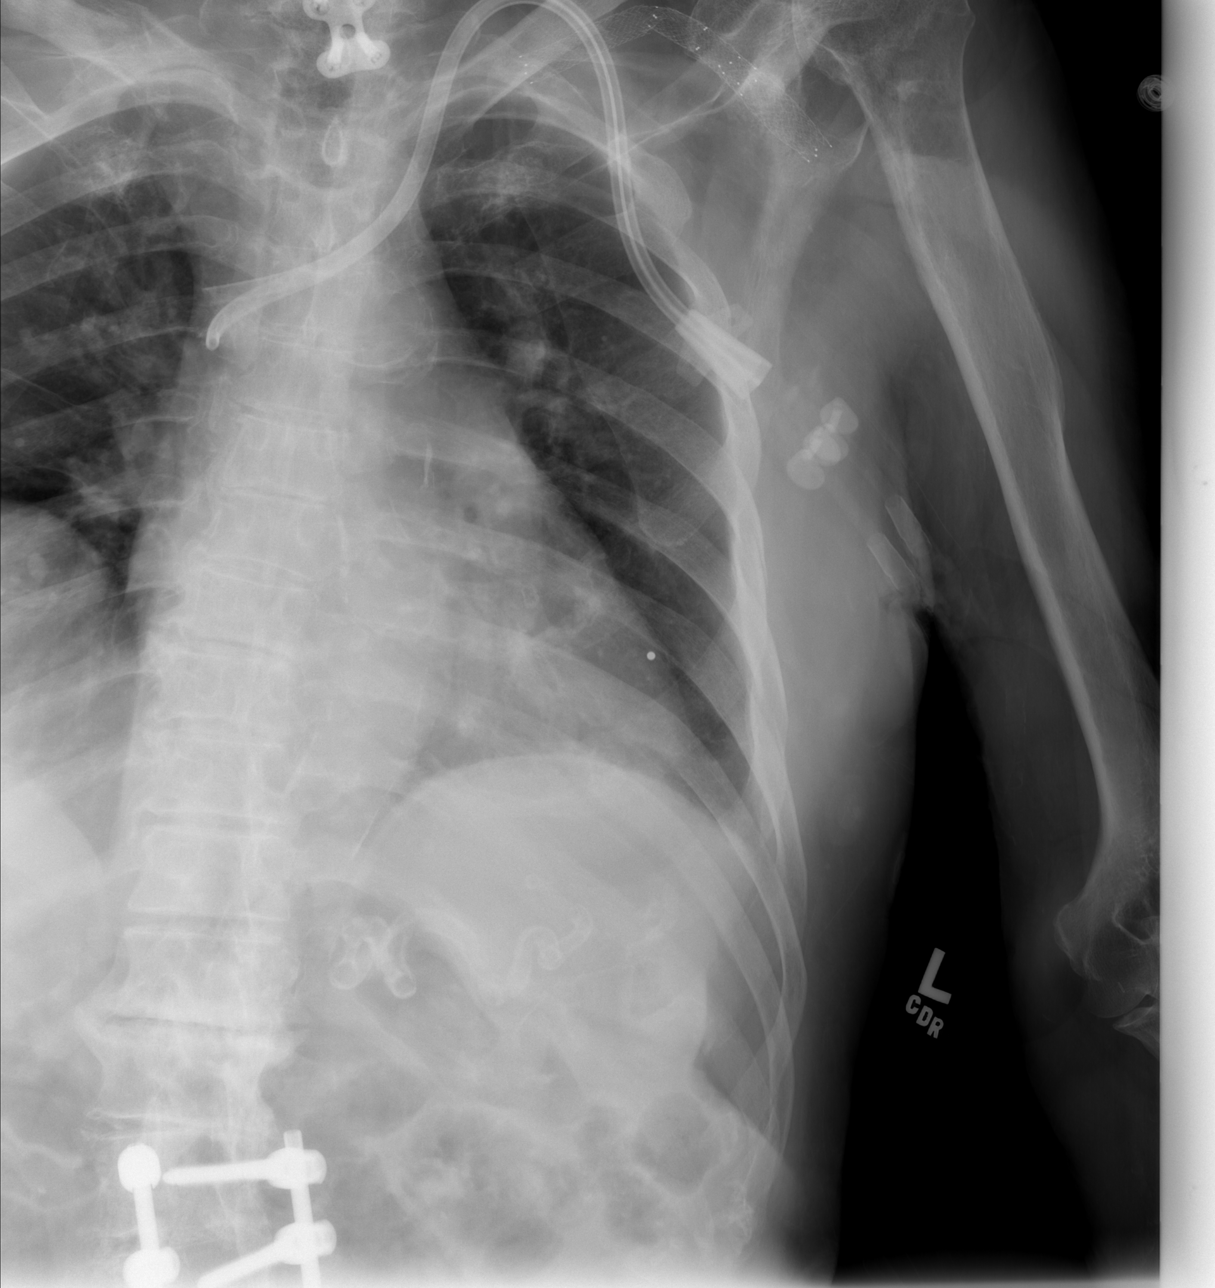

[t ribs obl. left]
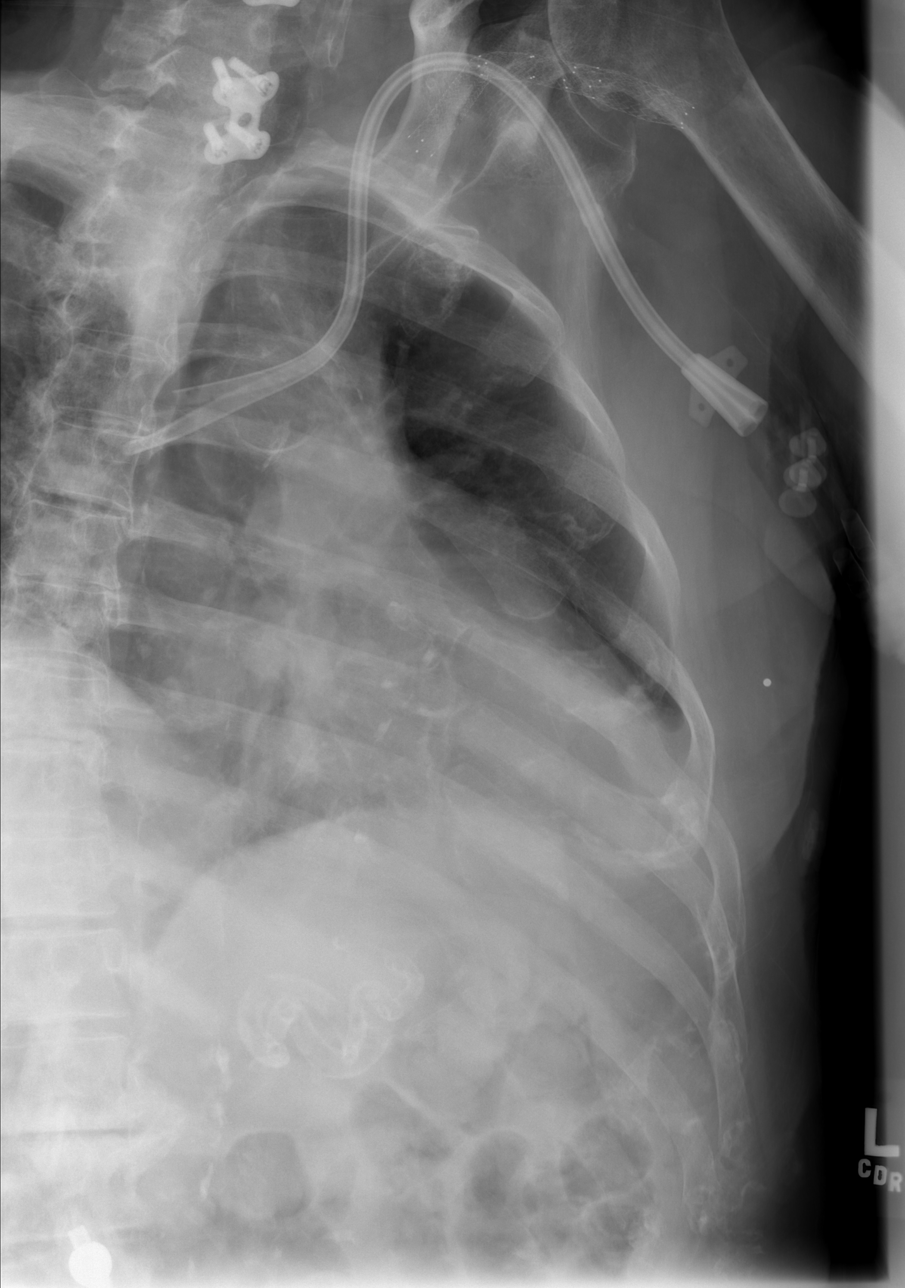

[3 of 3 positions shown; findings below may reference images not displayed]

FINDINGS: Mild cardiomegaly noted.
A left IJ central venous catheters noted with tips overlying the
upper SVC.
Elevation of the right hemidiaphragm is again noted.
There is no evidence of focal airspace disease, pulmonary edema,
suspicious pulmonary nodule/mass, pleural effusion, or
pneumothorax.
No acute bony abnormalities are identified.
Multiple remote left-sided rib fractures are identified without
definite acute fracture.
A vascular stent in the left subclavian/axillary region noted.
IMPRESSION: Cardiomegaly without evidence of acute cardiopulmonary disease.

Multiple remote appearing left rib fractures.  No definite acute
fracture identified..
